# Patient Record
Sex: Male | Born: 1955 | Race: White | Hispanic: No | Marital: Married | Smoking: Light tobacco smoker
Health system: Southern US, Community
[De-identification: ages and names within clinical notes are randomized; demographics above are authoritative.]

## PROBLEM LIST (undated history)

## (undated) DIAGNOSIS — E785 Hyperlipidemia, unspecified: Secondary | ICD-10-CM

## (undated) DIAGNOSIS — R200 Anesthesia of skin: Secondary | ICD-10-CM

## (undated) DIAGNOSIS — K603 Anal fistula, unspecified: Secondary | ICD-10-CM

## (undated) DIAGNOSIS — I341 Nonrheumatic mitral (valve) prolapse: Secondary | ICD-10-CM

## (undated) DIAGNOSIS — M722 Plantar fascial fibromatosis: Secondary | ICD-10-CM

## (undated) HISTORY — DX: Anesthesia of skin: R20.0

## (undated) HISTORY — DX: Anal fistula, unspecified: K60.30

## (undated) HISTORY — DX: Hyperlipidemia, unspecified: E78.5

## (undated) HISTORY — PX: TONSILLECTOMY: SUR1361

## (undated) HISTORY — DX: Anal fistula: K60.3

## (undated) HISTORY — DX: Plantar fascial fibromatosis: M72.2

## (undated) HISTORY — PX: OTHER SURGICAL HISTORY: SHX169

## (undated) HISTORY — PX: WISDOM TOOTH EXTRACTION: SHX21

## (undated) HISTORY — PX: COLONOSCOPY: SHX174

## (undated) HISTORY — DX: Nonrheumatic mitral (valve) prolapse: I34.1

---

## 1999-02-17 ENCOUNTER — Ambulatory Visit (HOSPITAL_COMMUNITY): Admission: RE | Admit: 1999-02-17 | Discharge: 1999-02-17 | Payer: Self-pay | Admitting: Gastroenterology

## 2004-09-21 ENCOUNTER — Encounter: Admission: RE | Admit: 2004-09-21 | Discharge: 2004-09-21 | Payer: Self-pay | Admitting: Internal Medicine

## 2006-01-08 ENCOUNTER — Encounter: Admission: RE | Admit: 2006-01-08 | Discharge: 2006-01-08 | Payer: Self-pay | Admitting: Internal Medicine

## 2010-03-08 ENCOUNTER — Ambulatory Visit: Payer: Self-pay | Admitting: Cardiovascular Disease

## 2010-07-30 ENCOUNTER — Encounter (HOSPITAL_BASED_OUTPATIENT_CLINIC_OR_DEPARTMENT_OTHER): Payer: Self-pay | Admitting: Internal Medicine

## 2010-08-21 ENCOUNTER — Other Ambulatory Visit (INDEPENDENT_AMBULATORY_CARE_PROVIDER_SITE_OTHER): Payer: 59

## 2010-08-21 DIAGNOSIS — I059 Rheumatic mitral valve disease, unspecified: Secondary | ICD-10-CM

## 2010-08-21 DIAGNOSIS — E7800 Pure hypercholesterolemia, unspecified: Secondary | ICD-10-CM

## 2010-09-13 ENCOUNTER — Other Ambulatory Visit: Payer: Self-pay | Admitting: Cardiovascular Disease

## 2010-09-13 ENCOUNTER — Other Ambulatory Visit (INDEPENDENT_AMBULATORY_CARE_PROVIDER_SITE_OTHER): Payer: 59

## 2010-09-13 DIAGNOSIS — E7800 Pure hypercholesterolemia, unspecified: Secondary | ICD-10-CM

## 2010-09-16 LAB — NMR LIPOPROFILE WITHOUT LIPIDS: LDL Particle Number: 2159 nmol/L — ABNORMAL HIGH (ref <1000)

## 2010-11-07 ENCOUNTER — Telehealth: Payer: Self-pay | Admitting: Cardiovascular Disease

## 2010-11-07 NOTE — Telephone Encounter (Signed)
RETURNING DR. Harvie Bridge CALL

## 2010-11-13 ENCOUNTER — Telehealth: Payer: Self-pay | Admitting: Cardiovascular Disease

## 2010-11-13 ENCOUNTER — Other Ambulatory Visit: Payer: Self-pay | Admitting: Cardiovascular Disease

## 2010-11-13 DIAGNOSIS — E785 Hyperlipidemia, unspecified: Secondary | ICD-10-CM

## 2010-11-13 NOTE — Telephone Encounter (Signed)
Call to Christopher Tanner to obtain pharmacy info to call in new script.

## 2010-11-13 NOTE — Telephone Encounter (Signed)
You wanted patient to try Niaspan 500mg . Pt report taking it and becoming flush. Pt is currently taking Zetia 10mg  po daily as prescribed 09/15/10.  Chart back on desk please advise of any changes. Thanks Chandelle Harkey Opal Sidles

## 2010-11-28 ENCOUNTER — Telehealth: Payer: Self-pay | Admitting: Cardiovascular Disease

## 2010-11-28 NOTE — Telephone Encounter (Signed)
RETURNING CALL REGARDING LAB REPORT ZETIA 10MG  SOMETIME IN April. WHEN HE CAME IN WITH HIS MOM. COMING IN June FOR LABS. DR, Elease Hashimoto TOLD HIM AND WROTE SCRIPT WHEN HE WAS HERE WITH MOM

## 2010-11-29 ENCOUNTER — Other Ambulatory Visit: Payer: Self-pay | Admitting: *Deleted

## 2010-11-29 ENCOUNTER — Telehealth: Payer: Self-pay | Admitting: *Deleted

## 2010-11-29 DIAGNOSIS — E785 Hyperlipidemia, unspecified: Secondary | ICD-10-CM

## 2010-11-29 NOTE — Telephone Encounter (Signed)
Error

## 2011-01-23 ENCOUNTER — Other Ambulatory Visit (INDEPENDENT_AMBULATORY_CARE_PROVIDER_SITE_OTHER): Payer: 59 | Admitting: *Deleted

## 2011-01-23 ENCOUNTER — Other Ambulatory Visit: Payer: Self-pay | Admitting: *Deleted

## 2011-01-23 DIAGNOSIS — E785 Hyperlipidemia, unspecified: Secondary | ICD-10-CM

## 2011-01-23 LAB — BASIC METABOLIC PANEL
GFR: 73.9 mL/min (ref >60.00)
Glucose, Bld: 94 mg/dL (ref 70–99)
Potassium: 4.7 mEq/L (ref 3.5–5.1)
Sodium: 139 mEq/L (ref 135–145)

## 2011-01-23 LAB — LIPID PANEL
Cholesterol: 159 mg/dL (ref 0–200)
HDL: 65.3 mg/dL (ref >39.00)
Total CHOL/HDL Ratio: 2
Triglycerides: 72 mg/dL (ref 0.0–149.0)
VLDL: 14.4 mg/dL (ref 0.0–40.0)

## 2011-01-23 LAB — HEPATIC FUNCTION PANEL
ALT: 30 U/L (ref 0–53)
Albumin: 4.5 g/dL (ref 3.5–5.2)
Total Protein: 7.3 g/dL (ref 6.0–8.3)

## 2011-01-24 ENCOUNTER — Telehealth: Payer: Self-pay | Admitting: *Deleted

## 2011-01-24 NOTE — Telephone Encounter (Signed)
Message copied by Antony Odea on Wed Jan 24, 2011  2:01 PM ------      Message from: [name]      Created: Wed Jan 24, 2011  6:00 AM       Lipids look good.  The lipomed profile is still pending

## 2011-01-24 NOTE — Telephone Encounter (Signed)
Patient called with lab results. To call back with questions Alfonso Ramus RN

## 2011-01-30 ENCOUNTER — Other Ambulatory Visit: Payer: 59 | Admitting: *Deleted

## 2011-02-05 ENCOUNTER — Ambulatory Visit (AMBULATORY_SURGERY_CENTER): Payer: 59 | Admitting: *Deleted

## 2011-02-05 VITALS — Ht 75.0 in | Wt 183.3 lb

## 2011-02-05 DIAGNOSIS — Z1211 Encounter for screening for malignant neoplasm of colon: Secondary | ICD-10-CM

## 2011-02-05 MED ORDER — PEG-KCL-NACL-NASULF-NA ASC-C 100 G PO SOLR: ORAL | Status: DC

## 2011-02-06 ENCOUNTER — Encounter: Payer: Self-pay | Admitting: Gastroenterology

## 2011-02-07 ENCOUNTER — Encounter: Payer: Self-pay | Admitting: Cardiovascular Disease

## 2011-02-08 ENCOUNTER — Ambulatory Visit (INDEPENDENT_AMBULATORY_CARE_PROVIDER_SITE_OTHER): Payer: 59 | Admitting: Cardiovascular Disease

## 2011-02-08 ENCOUNTER — Encounter: Payer: Self-pay | Admitting: Cardiovascular Disease

## 2011-02-08 ENCOUNTER — Other Ambulatory Visit: Payer: Self-pay | Admitting: *Deleted

## 2011-02-08 ENCOUNTER — Telehealth: Payer: Self-pay | Admitting: *Deleted

## 2011-02-08 VITALS — BP 118/80 | HR 80 | Ht 75.0 in | Wt 185.2 lb

## 2011-02-08 DIAGNOSIS — E785 Hyperlipidemia, unspecified: Secondary | ICD-10-CM

## 2011-02-08 NOTE — Assessment & Plan Note (Signed)
Christopher Tanner is doing very well from a cardiac standpoint. He has tolerated the study without any difficulty. He is basic lipid profile looks good. The lateral med profile was not ordered correctly and therefore was not performed. We will have him come back to the office in several days repeat that level.  We've discussed the possibility that he may have to try Niaspan again. He had severe flushing with that. We'll try to encourage him to take small doses if his LDL particle number still very high. I'll plan on seeing him again in one year.

## 2011-02-08 NOTE — Progress Notes (Signed)
Christopher Tanner Date of Birth  1956/03/21 [facility] / [facility] [address]     [address] [place], [place]  [address] [phone]  Fax  [phone]  History of Present Illness:  Christopher Tanner is a 55 year old gentleman with a history of hyperlipidemia much by prolapse. He's done very well from a cardiac standpoint.   He has not had any episodes of chest pain or shortness of breath. He is exercising on a regular basis.   Current Outpatient Prescriptions on File Prior to Visit  Medication Sig Dispense Refill  . aspirin 81 MG tablet Take 81 mg by mouth daily.        . Multiple Vitamin (MULTIVITAMIN) tablet Take 1 tablet by mouth daily.        . Omega-3 Fatty Acids (FISH OIL PO) Take by mouth daily.        . traZODone (DESYREL) 100 MG tablet Take 100 mg by mouth as needed.       Marland Kitchen ZETIA 10 MG tablet Take 10 mg by mouth daily.         Allergies  Allergen Reactions  . Crestor (Rosuvastatin Calcium)     Leg aches  . Simvastatin     Memory Loss  . Vytorin     Stopped due to publicity about zetia  . Niaspan (Niacin) Other (See Comments)    flushing   Past Medical History  Diagnosis Date  . Hyperlipidemia   . Mitral valve prolapse     Last echocardiogram July, 2008. Moderate MVP of the posterior leaflet. Moderate mitral regurgitation. Normal left ventricular systolic function.  [name] fistula     Past Surgical History  Procedure Date  . Hand broken     Left    History  Smoking status  . Current Everyday Smoker  . Types: Cigars  Smokeless tobacco  . Not on file    History  Alcohol Use  . 0.0 oz/week    No family history on file.  Reviw of Systems:  Reviewed in the HPI.  All other systems are negative.  Physical Exam: BP 118/80  Pulse 80  Ht 6\' 3"  (1.905 m)  Wt 185 lb 3.2 oz (84.006 kg)  BMI 23.15 kg/m2 The patient is alert and oriented x 3.  The mood and affect are normal.   Skin: warm and dry.  Color is normal.    HEENT:   the  sclera are nonicteric.  The mucous membranes are moist.  The carotids are 2+ without bruits.  There is no thyromegaly.  There is no JVD.    Lungs: clear.  The chest wall is non tender.    Heart: regular rate with a normal S1 and S2.  There is a soft midsystolic click. He is a 2/6 systolic murmur radiating to the axilla. The PMI is not displaced.     Abdomen: good bowel sounds.  There is no guarding or rebound.  There is no hepatosplenomegaly or tenderness.  There are no masses.   Extremities:  no clubbing, cyanosis, or edema.  The legs are without rashes.  The distal pulses are intact.   Neuro:  Cranial nerves II - XII are intact.  Motor and sensory functions are intact.    The gait is normal.  Assessment / Plan:

## 2011-02-08 NOTE — Telephone Encounter (Signed)
Set up fasting lab for lipomed

## 2011-02-19 ENCOUNTER — Other Ambulatory Visit: Payer: 59 | Admitting: Gastroenterology

## 2011-02-23 ENCOUNTER — Ambulatory Visit (INDEPENDENT_AMBULATORY_CARE_PROVIDER_SITE_OTHER): Payer: 59 | Admitting: *Deleted

## 2011-02-23 DIAGNOSIS — E785 Hyperlipidemia, unspecified: Secondary | ICD-10-CM

## 2011-02-25 LAB — NMR LIPOPROFILE WITH LIPIDS
HDL Particle Number: 37.6 umol/L (ref >=30.5)
HDL-C: 65 mg/dL (ref >=40)
LDL Particle Number: 1257 nmol/L — ABNORMAL HIGH (ref <1000)
LDL Size: 21 nm (ref >20.5)
Triglycerides: 76 mg/dL (ref <150)

## 2011-02-26 ENCOUNTER — Ambulatory Visit (AMBULATORY_SURGERY_CENTER): Payer: 59 | Admitting: Gastroenterology

## 2011-02-26 ENCOUNTER — Telehealth: Payer: Self-pay | Admitting: *Deleted

## 2011-02-26 ENCOUNTER — Encounter: Payer: Self-pay | Admitting: Gastroenterology

## 2011-02-26 VITALS — BP 107/78 | HR 64 | Temp 97.7°F | Resp 18 | Ht 75.0 in | Wt 178.0 lb

## 2011-02-26 DIAGNOSIS — Z1211 Encounter for screening for malignant neoplasm of colon: Secondary | ICD-10-CM

## 2011-02-26 MED ORDER — SODIUM CHLORIDE 0.9 % IV SOLN: 500.0000 mL | INTRAVENOUS | Status: DC

## 2011-02-26 NOTE — Patient Instructions (Signed)
Follow discharge instructions.  Continue your medications.  Start a high fiber diet with liberal fluid intake.  Try Benefiber or Metamucil daily, with liberal fluid intake.

## 2011-02-26 NOTE — Telephone Encounter (Signed)
msg left with results and dr note, to call back with any questions and office number given

## 2011-02-26 NOTE — Telephone Encounter (Signed)
Message copied by Antony Odea on Mon Feb 26, 2011 11:23 AM ------      Message from: [name]      Created: Mon Feb 26, 2011  5:41 AM       These are significantly better than previous readings. Continue current meds.

## 2011-02-27 ENCOUNTER — Telehealth: Payer: Self-pay | Admitting: Cardiovascular Disease

## 2011-02-27 ENCOUNTER — Telehealth: Payer: Self-pay | Admitting: *Deleted

## 2011-02-27 NOTE — Telephone Encounter (Signed)
Labs faxed,Jodette Laurelton RN

## 2011-02-27 NOTE — Telephone Encounter (Signed)
Message left

## 2011-02-27 NOTE — Telephone Encounter (Signed)
FAX [phone] recent lab results. Patient said that you did not have to call him back.

## 2011-04-09 HISTORY — PX: TREATMENT FISTULA ANAL: SUR1390

## 2011-04-11 DIAGNOSIS — K603 Anal fistula: Secondary | ICD-10-CM | POA: Insufficient documentation

## 2011-10-03 ENCOUNTER — Other Ambulatory Visit: Payer: Self-pay | Admitting: *Deleted

## 2011-10-03 MED ORDER — EZETIMIBE 10 MG PO TABS: 10.0000 mg | ORAL_TABLET | Freq: Every day | ORAL | Status: DC

## 2011-11-26 ENCOUNTER — Other Ambulatory Visit: Payer: Self-pay | Admitting: Orthopedic Surgery

## 2011-11-26 DIAGNOSIS — M25512 Pain in left shoulder: Secondary | ICD-10-CM

## 2011-11-28 ENCOUNTER — Other Ambulatory Visit: Payer: 59

## 2011-12-02 ENCOUNTER — Ambulatory Visit
Admission: RE | Admit: 2011-12-02 | Discharge: 2011-12-02 | Disposition: A | Discharge status: Home / Self Care | Payer: 59 | Source: Ambulatory Visit | Attending: Orthopedic Surgery | Admitting: Orthopedic Surgery

## 2011-12-02 DIAGNOSIS — M25512 Pain in left shoulder: Secondary | ICD-10-CM

## 2012-05-09 ENCOUNTER — Other Ambulatory Visit: Payer: Self-pay | Admitting: *Deleted

## 2012-05-09 MED ORDER — EZETIMIBE 10 MG PO TABS: 10.0000 mg | ORAL_TABLET | Freq: Every day | ORAL | Status: DC

## 2012-05-09 NOTE — Telephone Encounter (Signed)
Pt needs appointment then refill can be made Fax Received. Refill Completed. Christopher Tanner (R.M.A)   

## 2012-06-30 ENCOUNTER — Telehealth: Payer: Self-pay | Admitting: Cardiovascular Disease

## 2012-06-30 MED ORDER — EZETIMIBE 10 MG PO TABS: 10.0000 mg | ORAL_TABLET | Freq: Every day | ORAL | Status: DC

## 2012-06-30 NOTE — Telephone Encounter (Signed)
Pt needs Zetia refill  Sent to preferred pharmacy [facility].

## 2012-07-09 HISTORY — PX: KNEE ARTHROSCOPY: SUR90

## 2012-07-30 ENCOUNTER — Encounter: Payer: Self-pay | Admitting: *Deleted

## 2012-07-30 ENCOUNTER — Other Ambulatory Visit (INDEPENDENT_AMBULATORY_CARE_PROVIDER_SITE_OTHER): Payer: 59

## 2012-07-30 ENCOUNTER — Other Ambulatory Visit: Payer: Self-pay | Admitting: *Deleted

## 2012-07-30 DIAGNOSIS — E785 Hyperlipidemia, unspecified: Secondary | ICD-10-CM

## 2012-07-30 LAB — HEPATIC FUNCTION PANEL
AST: 31 U/L (ref 0–37)
Albumin: 3.9 g/dL (ref 3.5–5.2)
Total Bilirubin: 0.6 mg/dL (ref 0.3–1.2)

## 2012-07-30 LAB — BASIC METABOLIC PANEL
BUN: 18 mg/dL (ref 6–23)
Calcium: 8.9 mg/dL (ref 8.4–10.5)
Creatinine, Ser: 1.1 mg/dL (ref 0.4–1.5)
GFR: 77.54 mL/min (ref >60.00)
Glucose, Bld: 93 mg/dL (ref 70–99)

## 2012-07-30 MED ORDER — EZETIMIBE 10 MG PO TABS: 10.0000 mg | ORAL_TABLET | Freq: Every day | ORAL | Status: DC

## 2012-07-30 NOTE — Progress Notes (Signed)
Pt walked in asking for fasting labs, ordered nmr with lipids/ bmet/ liver panel

## 2012-07-30 NOTE — Telephone Encounter (Signed)
Fax Received. Refill Completed. Christopher Tanner (R.M.A)  NO REFILLS UNTIL APPOINTMENT  

## 2012-08-01 LAB — NMR LIPOPROFILE WITH LIPIDS
HDL Size: 8.9 nm — ABNORMAL LOW (ref >=9.2)
LDL Particle Number: 1031 nmol/L — ABNORMAL HIGH (ref <1000)
LDL Size: 20.8 nm (ref >20.5)
Large HDL-P: 5.7 umol/L (ref >=4.8)
Large VLDL-P: 2.8 nmol/L — ABNORMAL HIGH (ref <=2.7)
Small LDL Particle Number: 441 nmol/L (ref <=527)
VLDL Size: 48.4 nm — ABNORMAL HIGH (ref >=46.6)

## 2012-08-11 ENCOUNTER — Encounter: Payer: Self-pay | Admitting: Cardiovascular Disease

## 2012-08-12 ENCOUNTER — Encounter: Payer: Self-pay | Admitting: *Deleted

## 2012-08-13 ENCOUNTER — Encounter: Payer: Self-pay | Admitting: Cardiovascular Disease

## 2012-08-13 ENCOUNTER — Ambulatory Visit (INDEPENDENT_AMBULATORY_CARE_PROVIDER_SITE_OTHER): Payer: 59 | Admitting: Cardiovascular Disease

## 2012-08-13 VITALS — BP 108/80 | HR 75 | Ht 75.0 in | Wt 190.8 lb

## 2012-08-13 DIAGNOSIS — I341 Nonrheumatic mitral (valve) prolapse: Secondary | ICD-10-CM | POA: Insufficient documentation

## 2012-08-13 DIAGNOSIS — I059 Rheumatic mitral valve disease, unspecified: Secondary | ICD-10-CM

## 2012-08-13 DIAGNOSIS — E785 Hyperlipidemia, unspecified: Secondary | ICD-10-CM

## 2012-08-13 NOTE — Patient Instructions (Addendum)
Your physician wants you to follow-up in: 1 year You will receive a reminder letter in the mail two months in advance. If you don't receive a letter, please call our office to schedule the follow-up appointment.  Your physician recommends that you continue on your current medications as directed. Please refer to the Current Medication list given to you today.  Your physician has requested that you have an echocardiogram. Echocardiography is a painless test that uses sound waves to create images of your heart. It provides your doctor with information about the size and shape of your heart and how well your heart's chambers and valves are working. This procedure takes approximately one hour. There are no restrictions for this procedure.    

## 2012-08-13 NOTE — Progress Notes (Signed)
Christopher Tanner Date of Birth  1955/08/14 [facility] / [facility] [address]     [address] [place], [place]  [address] [phone]  Fax  [phone]  Problem List 1. Hyperlipidemia 2. Mitral valve prolapse  History of Present Illness:  Christopher Tanner is a 57 year old gentleman with a history of hyperlipidemia and mitral valve prolapse. He's done very well from a cardiac standpoint.   He has not had any episodes of chest pain or shortness of breath. He is exercising on a regular basis.  Feb. 5, 2014: He is doing well.  He has had some shoulder issues and is having to scale back his exercise.    Current Outpatient Prescriptions on File Prior to Visit  Medication Sig Dispense Refill  . aspirin 81 MG tablet Take 81 mg by mouth daily.        Marland Kitchen ezetimibe (ZETIA) 10 MG tablet Take 1 tablet (10 mg total) by mouth daily.  30 tablet  1  . Multiple Vitamin (MULTIVITAMIN) tablet Take 1 tablet by mouth daily.        . Omega-3 Fatty Acids (FISH OIL PO) Take by mouth daily.          Allergies  Allergen Reactions  . Crestor (Rosuvastatin Calcium)     Leg aches  . Ezetimibe-Simvastatin     Stopped due to publicity about zetia  . Simvastatin     Memory Loss  . Niaspan (Niacin) Other (See Comments)    flushing   Past Medical History  Diagnosis Date  . Hyperlipidemia   . Mitral valve prolapse     Last echocardiogram July, 2008. Moderate MVP of the posterior leaflet. Moderate mitral regurgitation. Normal left ventricular systolic function.  [name] fistula     Past Surgical History  Procedure Date  . Hand broken     Left  . Colonoscopy     History  Smoking status  . Light Tobacco Smoker  . Types: Cigars  Smokeless tobacco  . Not on file    History  Alcohol Use  . 0.0 oz/week    Family History  Problem Relation Age of Onset  . Heart disease Father     Reviw of Systems:  Reviewed in the HPI.  All other systems are negative.  Physical Exam: BP  108/80  Pulse 75  Ht 6\' 3"  (1.905 m)  Wt 190 lb 12.8 oz (86.546 kg)  BMI 23.85 kg/m2 The patient is alert and oriented x 3.  The mood and affect are normal.   Skin: warm and dry.  Color is normal.    HEENT:   the sclera are nonicteric.  The mucous membranes are moist.  The carotids are 2+ without bruits.  There is no thyromegaly.  There is no JVD.    Lungs: clear.  The chest wall is non tender.    Heart: regular rate with a normal S1 and S2.  There is a soft midsystolic click. He is a 2/6 systolic murmur radiating to the axilla. The PMI is not displaced.     Abdomen: good bowel sounds.  There is no guarding or rebound.  There is no hepatosplenomegaly or tenderness.  There are no masses.   Extremities:  no clubbing, cyanosis, or edema.  The legs are without rashes.  The distal pulses are intact.   Neuro:  Cranial nerves II - XII are intact.  Motor and sensory functions are intact.    The gait is normal.  ECG:  Feb. 5, 2014: NSR at 75, early repol.  Assessment / Plan:

## 2012-08-13 NOTE — Assessment & Plan Note (Signed)
Christopher Tanner seems to be doing well. His last LDL particle number was 1031 which is quite acceptable. He has done well on the current dose of Zetia 10 mg a day.  We'll continue his current medications.

## 2012-08-13 NOTE — Assessment & Plan Note (Signed)
He has a history of mitral regurgitation. It has been a Number of years since he had an echocardiogram. We will repeat his echocardiogram for further evaluation.  We had considerable discussion regarding mitral valve repair.

## 2012-08-20 ENCOUNTER — Ambulatory Visit (HOSPITAL_COMMUNITY): Payer: 59 | Attending: Cardiovascular Disease | Admitting: Radiology

## 2012-08-20 DIAGNOSIS — I341 Nonrheumatic mitral (valve) prolapse: Secondary | ICD-10-CM

## 2012-08-20 DIAGNOSIS — R011 Cardiac murmur, unspecified: Secondary | ICD-10-CM

## 2012-08-20 DIAGNOSIS — I079 Rheumatic tricuspid valve disease, unspecified: Secondary | ICD-10-CM | POA: Insufficient documentation

## 2012-08-20 DIAGNOSIS — I059 Rheumatic mitral valve disease, unspecified: Secondary | ICD-10-CM | POA: Insufficient documentation

## 2012-08-20 DIAGNOSIS — E785 Hyperlipidemia, unspecified: Secondary | ICD-10-CM | POA: Insufficient documentation

## 2012-08-20 NOTE — Progress Notes (Signed)
Echocardiogram performed.  

## 2012-08-23 ENCOUNTER — Other Ambulatory Visit: Payer: Self-pay

## 2012-08-27 ENCOUNTER — Telehealth: Payer: Self-pay | Admitting: Cardiovascular Disease

## 2012-08-27 NOTE — Telephone Encounter (Signed)
Pt requested call after 2p, rtn call re echo results

## 2012-08-27 NOTE — Telephone Encounter (Signed)
Left pt a message to call back. 

## 2012-11-11 ENCOUNTER — Ambulatory Visit
Admission: RE | Admit: 2012-11-11 | Discharge: 2012-11-11 | Disposition: A | Discharge status: Home / Self Care | Payer: 59 | Source: Ambulatory Visit | Attending: Orthopedic Surgery | Admitting: Orthopedic Surgery

## 2012-11-11 ENCOUNTER — Other Ambulatory Visit: Payer: Self-pay | Admitting: Orthopedic Surgery

## 2012-11-11 ENCOUNTER — Telehealth: Payer: Self-pay | Admitting: *Deleted

## 2012-11-11 DIAGNOSIS — R52 Pain, unspecified: Secondary | ICD-10-CM

## 2012-11-11 NOTE — Telephone Encounter (Signed)
He may hold his ASA for 7 days.  He is at low risk for surgery.

## 2012-11-11 NOTE — Telephone Encounter (Signed)
Received letter today for cardiac clearance from [facility] ortho. Needs clearance for left knee scoping and to hold asa 81 mg and how many days, please advise.

## 2012-11-11 NOTE — Telephone Encounter (Signed)
Their form was signed and faxed back by MR,

## 2012-11-12 ENCOUNTER — Other Ambulatory Visit: Payer: 59

## 2012-11-16 ENCOUNTER — Encounter: Payer: Self-pay | Admitting: Cardiovascular Disease

## 2012-11-17 ENCOUNTER — Other Ambulatory Visit: Payer: Self-pay | Admitting: *Deleted

## 2012-11-17 MED ORDER — GLUCOSAMINE CHONDR 1500 COMPLX PO CAPS: 1500.0000 | ORAL_CAPSULE | Freq: Every day | ORAL | Status: DC

## 2012-11-17 MED ORDER — MULTIVITAL PERFORMANCE PO TABS: 1.0000 | ORAL_TABLET | Freq: Every day | ORAL | Status: AC

## 2012-11-17 MED ORDER — CHOLECALCIFEROL 125 MCG (5000 UT) PO CAPS: 10000.0000 [IU] | ORAL_CAPSULE | Freq: Every day | ORAL | Status: DC

## 2012-11-17 MED ORDER — CO-ENZYME Q-10 10 MG PO CAPS: 1.0000 | ORAL_CAPSULE | Freq: Every day | ORAL | Status: DC

## 2012-11-17 NOTE — Progress Notes (Signed)
PER MYCHART REQUEST MED LIST UPDATED, SEVERAL OF THE VITAMINS AND SUCH WERE NOT ABLE TO BE MATCHED EXACTLY TO THE PTS  OTC SUPPLIMENTS, PT INFORMED.

## 2012-12-12 ENCOUNTER — Other Ambulatory Visit: Payer: Self-pay | Admitting: *Deleted

## 2012-12-12 MED ORDER — EZETIMIBE 10 MG PO TABS: 10.0000 mg | ORAL_TABLET | Freq: Every day | ORAL | Status: DC

## 2012-12-12 NOTE — Telephone Encounter (Signed)
Fax Received. Refill Completed. Christopher Tanner (R.M.A)   

## 2013-02-16 ENCOUNTER — Other Ambulatory Visit (HOSPITAL_COMMUNITY): Payer: Self-pay | Admitting: Cardiovascular Disease

## 2013-02-16 ENCOUNTER — Ambulatory Visit (HOSPITAL_COMMUNITY): Payer: 59 | Attending: Internal Medicine

## 2013-02-16 DIAGNOSIS — I341 Nonrheumatic mitral (valve) prolapse: Secondary | ICD-10-CM

## 2013-02-16 DIAGNOSIS — F17200 Nicotine dependence, unspecified, uncomplicated: Secondary | ICD-10-CM | POA: Insufficient documentation

## 2013-02-16 DIAGNOSIS — E785 Hyperlipidemia, unspecified: Secondary | ICD-10-CM | POA: Insufficient documentation

## 2013-02-16 DIAGNOSIS — I059 Rheumatic mitral valve disease, unspecified: Secondary | ICD-10-CM

## 2013-02-16 NOTE — Progress Notes (Signed)
Echocardiogram performed.  

## 2013-02-19 ENCOUNTER — Telehealth: Payer: Self-pay | Admitting: *Deleted

## 2013-02-19 NOTE — Telephone Encounter (Signed)
Pt called with results. He wants to know if he needs to keep 03/02/13 OV app?  Pt feeling well. Will a 1 year be ok? Please advise.

## 2013-02-19 NOTE — Telephone Encounter (Signed)
Message copied by Antony Odea on Thu Feb 19, 2013  4:53 PM ------      Message from: [name]      Created: Tue Feb 17, 2013  5:19 PM       Mild mitral valve prolapse with mild - mod MR. ------

## 2013-02-23 ENCOUNTER — Encounter: Payer: Self-pay | Admitting: Cardiovascular Disease

## 2013-02-23 NOTE — Telephone Encounter (Signed)
App cancelled/ wife was informed.

## 2013-02-23 NOTE — Telephone Encounter (Signed)
Ov 1 year from his last visit will be fine.  MVP is stable

## 2013-03-02 ENCOUNTER — Ambulatory Visit: Payer: 59 | Admitting: Cardiovascular Disease

## 2013-03-25 ENCOUNTER — Encounter: Payer: Self-pay | Admitting: Cardiovascular Disease

## 2013-05-14 ENCOUNTER — Other Ambulatory Visit: Payer: Self-pay

## 2013-07-07 ENCOUNTER — Other Ambulatory Visit: Payer: Self-pay | Admitting: Cardiovascular Disease

## 2013-07-28 ENCOUNTER — Other Ambulatory Visit: Payer: Self-pay | Admitting: *Deleted

## 2013-07-28 ENCOUNTER — Encounter: Payer: Self-pay | Admitting: Cardiovascular Disease

## 2013-07-28 DIAGNOSIS — E785 Hyperlipidemia, unspecified: Secondary | ICD-10-CM

## 2013-07-28 NOTE — Progress Notes (Signed)
Lab orders placed for an upcoming app.

## 2013-08-20 ENCOUNTER — Other Ambulatory Visit (INDEPENDENT_AMBULATORY_CARE_PROVIDER_SITE_OTHER): Payer: 59

## 2013-08-20 DIAGNOSIS — E785 Hyperlipidemia, unspecified: Secondary | ICD-10-CM

## 2013-08-20 LAB — HEPATIC FUNCTION PANEL
ALBUMIN: 3.9 g/dL (ref 3.5–5.2)
ALK PHOS: 45 U/L (ref 39–117)
ALT: 38 U/L (ref 0–53)
AST: 33 U/L (ref 0–37)
Bilirubin, Direct: 0 mg/dL (ref 0.0–0.3)
TOTAL PROTEIN: 6.9 g/dL (ref 6.0–8.3)
Total Bilirubin: 0.6 mg/dL (ref 0.3–1.2)

## 2013-08-20 LAB — BASIC METABOLIC PANEL
BUN: 19 mg/dL (ref 6–23)
CHLORIDE: 105 meq/L (ref 96–112)
CO2: 27 meq/L (ref 19–32)
CREATININE: 1 mg/dL (ref 0.4–1.5)
Calcium: 8.6 mg/dL (ref 8.4–10.5)
GFR: 81.73 mL/min (ref >60.00)
Glucose, Bld: 86 mg/dL (ref 70–99)
POTASSIUM: 4 meq/L (ref 3.5–5.1)
SODIUM: 141 meq/L (ref 135–145)

## 2013-08-20 LAB — LIPID PANEL
CHOL/HDL RATIO: 3
CHOLESTEROL: 172 mg/dL (ref 0–200)
HDL: 57.1 mg/dL (ref >39.00)
LDL CALC: 97 mg/dL (ref 0–99)
TRIGLYCERIDES: 89 mg/dL (ref 0.0–149.0)
VLDL: 17.8 mg/dL (ref 0.0–40.0)

## 2013-08-25 ENCOUNTER — Ambulatory Visit: Payer: 59 | Admitting: Cardiovascular Disease

## 2013-09-20 ENCOUNTER — Other Ambulatory Visit: Payer: Self-pay | Admitting: Cardiovascular Disease

## 2013-09-24 ENCOUNTER — Ambulatory Visit: Payer: 59 | Admitting: Cardiovascular Disease

## 2013-09-29 ENCOUNTER — Encounter: Payer: Self-pay | Admitting: Cardiovascular Disease

## 2013-09-29 ENCOUNTER — Ambulatory Visit (INDEPENDENT_AMBULATORY_CARE_PROVIDER_SITE_OTHER): Payer: 59 | Admitting: Cardiovascular Disease

## 2013-09-29 VITALS — BP 110/80 | HR 69 | Ht 75.0 in | Wt 190.1 lb

## 2013-09-29 DIAGNOSIS — E785 Hyperlipidemia, unspecified: Secondary | ICD-10-CM

## 2013-09-29 DIAGNOSIS — I059 Rheumatic mitral valve disease, unspecified: Secondary | ICD-10-CM

## 2013-09-29 DIAGNOSIS — I341 Nonrheumatic mitral (valve) prolapse: Secondary | ICD-10-CM

## 2013-09-29 NOTE — Assessment & Plan Note (Signed)
We had long discussion about lipids and will go particle numbers. His LDL particle number has decreased significantly from 2012 (   2159, 1257, 1031)  He is tolerating his Zetia quite well.  Will continue his current medications. In one year we will get a repeat Lipomed profile, lipids, liver enz. BMP.  I will see him 2- 3 weeks after that for followup office visit.

## 2013-09-29 NOTE — Patient Instructions (Signed)
Your physician recommends that you return for a FASTING lipid profile: 1 YEAR    Your physician wants you to follow-up in: 1 YEAR// 1 WEEK AFTER LAB DATE You will receive a reminder letter in the mail two months in advance. If you don't receive a letter, please call our office to schedule the follow-up appointment.  Your physician recommends that you continue on your current medications as directed. Please refer to the Current Medication list given to you today.

## 2013-09-29 NOTE — Progress Notes (Signed)
Christopher Tanner Date of Birth  1956/05/05 [facility] [address]     [place], [place]  [address] [phone]  Fax  [phone]  Problem List 1. Hyperlipidemia 2. Mitral valve prolapse  History of Present Illness:  Christopher Tanner is a 58 year old gentleman with a history of hyperlipidemia and mitral valve prolapse. He's done very well from a cardiac standpoint.   He has not had any episodes of chest pain or shortness of breath. He is exercising on a regular basis.  Feb. 5, 2014: He is doing well.  He has had some shoulder issues and is having to scale back his exercise.    September 29, 2013:  Christopher Tanner is doing well.  He had left knee surgery Percell Miller)  last year that has done well.  No dyspnea.  Running regularly without problems.    Current Outpatient Prescriptions on File Prior to Visit  Medication Sig Dispense Refill  . aspirin 81 MG tablet Take 81 mg by mouth daily.        . Cholecalciferol ([facility] VIT D 5000 HIGH-POTENCY) 5000 UNITS capsule Take 2 capsules (10,000 Units total) by mouth daily.      Marland Kitchen Co-Enzyme Q-10 10 MG CAPS Take 1 capsule (10 mg total) by mouth daily.    0  . Glucosamine-Chondroit-Vit C-Mn (GLUCOSAMINE CHONDR 1500 COMPLX) CAPS Take 1,500 capsules by mouth daily.      . Multiple Vitamin (MULTIVITAMIN) tablet Take 1 tablet by mouth daily.        . Multiple Vitamins-Minerals (MULTIVITAL PERFORMANCE) TABS Take 1 tablet by mouth daily.      . Omega-3 Fatty Acids (FISH OIL PO) Take by mouth daily.        Marland Kitchen ZETIA 10 MG tablet TAKE 1 TABLET (10 MG TOTAL) BY MOUTH DAILY.  30 tablet  0   No current facility-administered medications on file prior to visit.    Allergies  Allergen Reactions  . Crestor [Rosuvastatin Calcium]     Leg aches  . Ezetimibe-Simvastatin     Stopped due to publicity about zetia  . Simvastatin     Memory Loss  . Niaspan [Niacin] Other (See Comments)    flushing   Past Medical History  Diagnosis Date  .  Hyperlipidemia   . Mitral valve prolapse     Last echocardiogram July, 2008. Moderate MVP of the posterior leaflet. Moderate mitral regurgitation. Normal left ventricular systolic function.  Orpah Cobb fistula     Past Surgical History  Procedure Laterality Date  . Hand broken      Left  . Colonoscopy      History  Smoking status  . Light Tobacco Smoker  . Types: Cigars  Smokeless tobacco  . Not on file    History  Alcohol Use  . 0.0 oz/week    Family History  Problem Relation Age of Onset  . Heart disease Father     Reviw of Systems:  Reviewed in the HPI.  All other systems are negative.  Physical Exam: BP 110/80  Pulse 69  Ht 6\' 3"  (1.905 m)  Wt 190 lb 1.9 oz (86.238 kg)  BMI 23.76 kg/m2 The patient is alert and oriented x 3.  The mood and affect are normal.   Skin: warm and dry.  Color is normal.    HEENT:   the sclera are nonicteric.  The mucous membranes are moist.  The carotids are 2+ without bruits.  There is no thyromegaly.  There is no JVD.    Lungs: clear.  The chest wall is non tender.    Heart: regular rate with a normal S1 and S2.  There is a soft, late midsystolic click. He is a 2/6 systolic murmur radiating to the axilla. The PMI is not displaced.     Abdomen: good bowel sounds.  There is no guarding or rebound.  There is no hepatosplenomegaly or tenderness.  There are no masses.   Extremities:  no clubbing, cyanosis, or edema.  The legs are without rashes.  The distal pulses are intact.   Neuro:  Cranial nerves II - XII are intact.  Motor and sensory functions are intact.    The gait is normal.  ECG:  September 29, 2013:  nsr at 6. Normal ecg.   Assessment / Plan:

## 2013-09-29 NOTE — Assessment & Plan Note (Signed)
Christopher Tanner is doing well. His mitral valve prolapse is stable. He is a soft systolic murmur. His echo last year showed no changes in his mitral valve prolapse or mitral regurgitation.

## 2013-10-24 ENCOUNTER — Other Ambulatory Visit: Payer: Self-pay | Admitting: Cardiovascular Disease

## 2014-04-23 ENCOUNTER — Other Ambulatory Visit: Payer: Self-pay

## 2014-04-26 ENCOUNTER — Other Ambulatory Visit: Payer: Self-pay | Admitting: Cardiovascular Disease

## 2014-09-20 ENCOUNTER — Other Ambulatory Visit (INDEPENDENT_AMBULATORY_CARE_PROVIDER_SITE_OTHER): Payer: 59 | Admitting: *Deleted

## 2014-09-20 DIAGNOSIS — I341 Nonrheumatic mitral (valve) prolapse: Secondary | ICD-10-CM

## 2014-09-20 DIAGNOSIS — E785 Hyperlipidemia, unspecified: Secondary | ICD-10-CM

## 2014-09-20 LAB — HEPATIC FUNCTION PANEL
ALBUMIN: 4 g/dL (ref 3.5–5.2)
ALT: 32 U/L (ref 0–53)
AST: 29 U/L (ref 0–37)
Alkaline Phosphatase: 45 U/L (ref 39–117)
Bilirubin, Direct: 0.1 mg/dL (ref 0.0–0.3)
TOTAL PROTEIN: 6.5 g/dL (ref 6.0–8.3)
Total Bilirubin: 0.5 mg/dL (ref 0.2–1.2)

## 2014-09-20 LAB — BASIC METABOLIC PANEL
BUN: 22 mg/dL (ref 6–23)
CALCIUM: 8.8 mg/dL (ref 8.4–10.5)
CHLORIDE: 106 meq/L (ref 96–112)
CO2: 29 mEq/L (ref 19–32)
Creatinine, Ser: 1.08 mg/dL (ref 0.40–1.50)
GFR: 74.5 mL/min (ref >60.00)
GLUCOSE: 93 mg/dL (ref 70–99)
Potassium: 4 mEq/L (ref 3.5–5.1)
Sodium: 138 mEq/L (ref 135–145)

## 2014-09-21 ENCOUNTER — Other Ambulatory Visit: Payer: Self-pay

## 2014-09-22 LAB — NMR LIPOPROFILE WITH LIPIDS
CHOLESTEROL, TOTAL: 172 mg/dL (ref 100–199)
HDL Particle Number: 38.1 umol/L (ref >=30.5)
HDL Size: 9 nm — ABNORMAL LOW (ref >=9.2)
HDL-C: 58 mg/dL (ref >39)
LARGE HDL: 6.9 umol/L (ref >=4.8)
LARGE VLDL-P: 2.7 nmol/L (ref <=2.7)
LDL (calc): 93 mg/dL (ref 0–99)
LDL Particle Number: 1096 nmol/L — ABNORMAL HIGH (ref <1000)
LDL Size: 21.1 nm (ref >=20.8)
LP-IR Score: 49 — ABNORMAL HIGH (ref <=45)
Small LDL Particle Number: 300 nmol/L (ref <=527)
TRIGLYCERIDES: 107 mg/dL (ref 0–149)
VLDL Size: 51.2 nm — ABNORMAL HIGH (ref <=46.6)

## 2014-10-05 ENCOUNTER — Ambulatory Visit (INDEPENDENT_AMBULATORY_CARE_PROVIDER_SITE_OTHER): Payer: 59 | Admitting: Cardiovascular Disease

## 2014-10-05 ENCOUNTER — Encounter: Payer: Self-pay | Admitting: Cardiovascular Disease

## 2014-10-05 VITALS — BP 100/74 | HR 64 | Ht 75.0 in | Wt 192.8 lb

## 2014-10-05 DIAGNOSIS — E785 Hyperlipidemia, unspecified: Secondary | ICD-10-CM | POA: Diagnosis not present

## 2014-10-05 DIAGNOSIS — I341 Nonrheumatic mitral (valve) prolapse: Secondary | ICD-10-CM

## 2014-10-05 NOTE — Progress Notes (Addendum)
Cardiology Office Note   Date:  10/05/2014   ID:  Christopher Tanner, DOB 04-Jan-1956, MRN [id number]  PCP:  Donnajean Lopes, MD  Cardiologist:   Thayer Headings, MD   Chief Complaint  Patient presents with  . Follow-up    mitral valve prolapse   1. Hyperlipidemia 2. Mitral valve prolapse  History of Present Illness:  Christopher Tanner is a 59 year old gentleman with a history of hyperlipidemia and mitral valve prolapse. He's done very well from a cardiac standpoint.  He has not had any episodes of chest pain or shortness of breath. He is exercising on a regular basis.  Feb. 5, 2014: He is doing well. He has had some shoulder issues and is having to scale back his exercise.     October 05, 2014:  Christopher Tanner is a 59 y.o. male who presents for his MVP. No CP , no dyspnea.  No symptoms related to MVP,      Past Medical History  Diagnosis Date  . Hyperlipidemia   . Mitral valve prolapse     Last echocardiogram July, 2008. Moderate MVP of the posterior leaflet. Moderate mitral regurgitation. Normal left ventricular systolic function.  Christopher Tanner fistula     Past Surgical History  Procedure Laterality Date  . Hand broken      Left  . Colonoscopy       Current Outpatient Prescriptions  Medication Sig Dispense Refill  . aspirin 81 MG tablet Take 81 mg by mouth daily.      . Cholecalciferol ([facility] VIT D 5000 HIGH-POTENCY) 5000 UNITS capsule Take 2 capsules (10,000 Units total) by mouth daily.    Marland Kitchen Co-Enzyme Q-10 10 MG CAPS Take 1 capsule (10 mg total) by mouth daily.  0  . Multiple Vitamin (MULTIVITAMIN) tablet Take 1 tablet by mouth daily.      . Multiple Vitamins-Minerals (MULTIVITAL PERFORMANCE) TABS Take 1 tablet by mouth daily.    . Omega-3 Fatty Acids (FISH OIL PO) Take by mouth daily.      Marland Kitchen ZETIA 10 MG tablet TAKE 1 TABLET (10 MG TOTAL) BY MOUTH DAILY. 30 tablet 11   No current facility-administered medications for this visit.    Allergies:   Crestor;  Ezetimibe-simvastatin; Simvastatin; and Niaspan    Social History:  The patient  reports that he has been smoking Cigars.  He does not have any smokeless tobacco history on file. He reports that he drinks alcohol. He reports that he does not use illicit drugs.   Family History:  The patient's family history includes Heart disease in his father.    ROS:  Please see the history of present illness.    Review of Systems: Constitutional:  denies fever, chills, diaphoresis, appetite change and fatigue.  HEENT: denies photophobia, eye pain, redness, hearing loss, ear pain, congestion, sore throat, rhinorrhea, sneezing, neck pain, neck stiffness and tinnitus.  Respiratory: denies SOB, DOE, cough, chest tightness, and wheezing.  Cardiovascular: denies chest pain, palpitations and leg swelling.  Gastrointestinal: denies nausea, vomiting, abdominal pain, diarrhea, constipation, blood in stool.  Genitourinary: denies dysuria, urgency, frequency, hematuria, flank pain and difficulty urinating.  Musculoskeletal: denies  myalgias, back pain, joint swelling, arthralgias and gait problem.   Skin: denies pallor, rash and wound.  Neurological: denies dizziness, seizures, syncope, weakness, light-headedness, numbness and headaches.   Hematological: denies adenopathy, easy bruising, personal or family bleeding history.  Psychiatric/ Behavioral: denies suicidal ideation, mood changes, confusion, nervousness, sleep disturbance and agitation.  All other systems are reviewed and negative.    PHYSICAL EXAM: VS:  BP 100/74 mmHg  Pulse 64  Ht 6\' 3"  (1.905 m)  Wt 192 lb 12.8 oz (87.454 kg)  BMI 24.10 kg/m2 , BMI Body mass index is 24.1 kg/(m^2). GEN: Well nourished, well developed, in no acute distress HEENT: normal Neck: no JVD, carotid bruits, or masses Cardiac: RR, 2/6  late systolic murmur  Respiratory:  clear to auscultation bilaterally, normal work of breathing GI: soft, nontender,  nondistended, + BS MS: no deformity or atrophy Skin: warm and dry, no rash Neuro:  Strength and sensation are intact Psych: normal   EKG:  EKG is ordered today. The ekg ordered today demonstrates NSR at 64. NSR with sinus arrhthymia.  Normal ecg    Recent Labs: 09/20/2014: ALT 32; BUN 22; Creatinine 1.08; Potassium 4.0; Sodium 138    Lipid Panel    Component Value Date/Time   CHOL 172 09/20/2014 0828   CHOL 172 08/20/2013 0746   TRIG 107 09/20/2014 0828   TRIG 89.0 08/20/2013 0746   HDL 58 09/20/2014 0828   HDL 57.10 08/20/2013 0746   CHOLHDL 3 08/20/2013 0746   VLDL 17.8 08/20/2013 0746   LDLCALC 93 09/20/2014 0828   LDLCALC 97 08/20/2013 0746      Wt Readings from Last 3 Encounters:  10/05/14 192 lb 12.8 oz (87.454 kg)  09/29/13 190 lb 1.9 oz (86.238 kg)  08/13/12 190 lb 12.8 oz (86.546 kg)      Other studies Reviewed: Additional studies/ records that were reviewed today include: . Review of the above records demonstrates:    ASSESSMENT AND PLAN:  1.  Mitral valve prolapse:  He is having some increasing shortness of breath at times. He's noticed that he is not able to run as far as he  used to. Because of these symptoms and because he had at least moderate mitral valve prolapse at his last echo, we'll repeat his echocardiogram next week or so. I'll plan on seeing him in one year for follow-up.  2. Hyperlipidemia:  Labs look great  Continue Zetia.recheck labs in 1 year    Current medicines are reviewed at length with the patient today.  The patient does not have concerns regarding medicines.  The following changes have been made:  no change  Labs/ tests ordered today include:  Orders Placed This Encounter  Procedures  . EKG 12-Lead  . 2D Echocardiogram with contrast     Disposition:   FU with me in 1 year     Signed, Cordia Miklos, [name], MD  10/05/2014 8:50 AM    [facility] [place], [place], [place]  [address] Phone:  [phone]; Fax: [phone]

## 2014-10-05 NOTE — Patient Instructions (Signed)
Your physician recommends that you continue on your current medications as directed. Please refer to the Current Medication list given to you today.  Your physician has requested that you have an echocardiogram. Echocardiography is a painless test that uses sound waves to create images of your heart. It provides your doctor with information about the size and shape of your heart and how well your heart's chambers and valves are working. This procedure takes approximately one hour. There are no restrictions for this procedure.  Your physician wants you to follow-up in: 1 year with Dr. Nahser.  You will receive a reminder letter in the mail two months in advance. If you don't receive a letter, please call our office to schedule the follow-up appointment.  

## 2014-10-07 ENCOUNTER — Ambulatory Visit (HOSPITAL_COMMUNITY): Payer: 59 | Attending: Cardiovascular Disease

## 2014-10-07 DIAGNOSIS — E785 Hyperlipidemia, unspecified: Secondary | ICD-10-CM | POA: Insufficient documentation

## 2014-10-07 DIAGNOSIS — I341 Nonrheumatic mitral (valve) prolapse: Secondary | ICD-10-CM

## 2014-10-07 DIAGNOSIS — Z720 Tobacco use: Secondary | ICD-10-CM | POA: Insufficient documentation

## 2014-10-07 NOTE — Progress Notes (Signed)
2D Echo completed. 10/07/2014

## 2015-04-30 ENCOUNTER — Other Ambulatory Visit: Payer: Self-pay | Admitting: Cardiovascular Disease

## 2015-09-22 ENCOUNTER — Telehealth: Payer: Self-pay | Admitting: Cardiovascular Disease

## 2015-09-22 DIAGNOSIS — E785 Hyperlipidemia, unspecified: Secondary | ICD-10-CM

## 2015-09-22 NOTE — Telephone Encounter (Signed)
New Message  Pt wanted lab work 4/3 before 4/11 appt w/ Dr Acie Fredrickson- no lab in syst. Please advise.

## 2015-09-23 NOTE — Telephone Encounter (Signed)
Left message for patient that fasting lab orders have been placed and he is scheduled for lab appointment on 4/3 prior to appointment with Dr. Acie Fredrickson on 4/11.  I advised he call back with questions or concerns.

## 2015-10-10 ENCOUNTER — Other Ambulatory Visit (INDEPENDENT_AMBULATORY_CARE_PROVIDER_SITE_OTHER): Payer: 59 | Admitting: *Deleted

## 2015-10-10 DIAGNOSIS — E785 Hyperlipidemia, unspecified: Secondary | ICD-10-CM

## 2015-10-10 LAB — COMPREHENSIVE METABOLIC PANEL WITH GFR
ALT: 32 U/L (ref 9–46)
AST: 23 U/L (ref 10–35)
Albumin: 4 g/dL (ref 3.6–5.1)
Alkaline Phosphatase: 44 U/L (ref 40–115)
BUN: 12 mg/dL (ref 7–25)
CO2: 28 mmol/L (ref 20–31)
Calcium: 8.5 mg/dL — ABNORMAL LOW (ref 8.6–10.3)
Chloride: 104 mmol/L (ref 98–110)
Creat: 0.92 mg/dL (ref 0.70–1.33)
Glucose, Bld: 88 mg/dL (ref 65–99)
Potassium: 4.4 mmol/L (ref 3.5–5.3)
Sodium: 140 mmol/L (ref 135–146)
Total Bilirubin: 0.6 mg/dL (ref 0.2–1.2)
Total Protein: 6.5 g/dL (ref 6.1–8.1)

## 2015-10-10 LAB — LIPID PANEL
Cholesterol: 142 mg/dL (ref 125–200)
HDL: 67 mg/dL
LDL Cholesterol: 62 mg/dL
Total CHOL/HDL Ratio: 2.1 ratio
Triglycerides: 67 mg/dL
VLDL: 13 mg/dL

## 2015-10-11 ENCOUNTER — Telehealth: Payer: Self-pay | Admitting: Cardiovascular Disease

## 2015-10-11 NOTE — Telephone Encounter (Signed)
Follow Up: ° ° ° °Returning your call from yesterday. °

## 2015-10-11 NOTE — Telephone Encounter (Signed)
Called patient back with lab results. 

## 2015-10-18 ENCOUNTER — Encounter: Payer: Self-pay | Admitting: Cardiovascular Disease

## 2015-10-18 ENCOUNTER — Ambulatory Visit (INDEPENDENT_AMBULATORY_CARE_PROVIDER_SITE_OTHER): Payer: 59 | Admitting: Cardiovascular Disease

## 2015-10-18 VITALS — BP 96/64 | HR 67 | Ht 75.0 in | Wt 193.8 lb

## 2015-10-18 DIAGNOSIS — E785 Hyperlipidemia, unspecified: Secondary | ICD-10-CM

## 2015-10-18 DIAGNOSIS — I341 Nonrheumatic mitral (valve) prolapse: Secondary | ICD-10-CM | POA: Diagnosis not present

## 2015-10-18 NOTE — Progress Notes (Signed)
Cardiology Office Note   Date:  10/18/2015   ID:  Christopher Tanner, DOB [birth year], MRN [id number]  PCP:  Donnajean Lopes, MD  Cardiologist:   Thayer Headings, MD   Chief Complaint  Patient presents with  . Follow-up    hyperlipidemia   1. Hyperlipidemia 2. Mitral valve prolapse  History of Present Illness:  Christopher Tanner is a 60 year old gentleman with a history of hyperlipidemia and mitral valve prolapse. He's done very well from a cardiac standpoint.  He has not had any episodes of chest pain or shortness of breath. He is exercising on a regular basis.  Feb. 5, 2014: He is doing well. He has had some shoulder issues and is having to scale back his exercise.     October 05, 2014:  Christopher Tanner is a 60 y.o. male who presents for his MVP. No CP , no dyspnea.  No symptoms related to MVP,   October 18, 2015:  Doing great.   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    Past Medical History  Diagnosis Date  . Hyperlipidemia   . Mitral valve prolapse     Last echocardiogram July, 2008. Moderate MVP of the posterior leaflet. Moderate mitral regurgitation. Normal left ventricular systolic function.  Orpah Cobb fistula     Past Surgical History  Procedure Laterality Date  . Hand broken      Left  . Colonoscopy       Current Outpatient Prescriptions  Medication Sig Dispense Refill  . aspirin 81 MG tablet Take 81 mg by mouth daily.      . Cholecalciferol ([facility]S VIT D 5000 HIGH-POTENCY) 5000 UNITS capsule Take 2 capsules (10,000 Units total) by mouth daily.    Marland Kitchen Co-Enzyme Q-10 10 MG CAPS Take 1 capsule (10 mg total) by mouth daily.  0  . Multiple Vitamin (MULTIVITAMIN) tablet Take 1 tablet by mouth daily.      . Multiple Vitamins-Minerals (MULTIVITAL PERFORMANCE) TABS Take 1 tablet by mouth daily.    . Omega-3 Fatty Acids (FISH OIL PO) Take by mouth daily.      Marland Kitchen ZETIA 10 MG tablet TAKE 1 TABLET (10 MG TOTAL) BY MOUTH DAILY. 30 tablet 5   No current  facility-administered medications for this visit.    Allergies:   Crestor; Ezetimibe-simvastatin; Simvastatin; and Niaspan    Social History:  The patient  reports that he has been smoking Cigars.  He does not have any smokeless tobacco history on file. He reports that he drinks alcohol. He reports that he does not use illicit drugs.   Family History:  The patient's family history includes Heart disease in his father.    ROS:  Please see the history of present illness.    Review of Systems: Constitutional:  denies fever, chills, diaphoresis, appetite change and fatigue.  HEENT: denies photophobia, eye pain, redness, hearing loss, ear pain, congestion, sore throat, rhinorrhea, sneezing, neck pain, neck stiffness and tinnitus.  Respiratory: denies SOB, DOE, cough, chest tightness, and wheezing.  Cardiovascular: denies chest pain, palpitations and leg swelling.  Gastrointestinal: denies nausea, vomiting, abdominal pain, diarrhea, constipation, blood in stool.  Genitourinary: denies dysuria, urgency, frequency, hematuria, flank pain and difficulty urinating.  Musculoskeletal: denies  myalgias, back pain, joint swelling, arthralgias and gait problem.   Skin: denies pallor, rash and wound.  Neurological: denies dizziness, seizures, syncope, weakness, light-headedness, numbness and headaches.   Hematological: denies adenopathy, easy bruising, personal or family bleeding history.  Psychiatric/ Behavioral: denies suicidal ideation, mood changes, confusion, nervousness, sleep disturbance and agitation.       All other systems are reviewed and negative.    PHYSICAL EXAM: VS:  BP 96/64 mmHg  Pulse 67  Ht 6\' 3"  (1.905 m)  Wt 193 lb 12.8 oz (87.907 kg)  BMI 24.22 kg/m2 , BMI Body mass index is 24.22 kg/(m^2). GEN: Well nourished, well developed, in no acute distress HEENT: normal Neck: no JVD, carotid bruits, or masses Cardiac: RR, 2/6  late systolic murmur  Respiratory:  clear to  auscultation bilaterally, normal work of breathing GI: soft, nontender, nondistended, + BS MS: no deformity or atrophy Skin: warm and dry, no rash Neuro:  Strength and sensation are intact Psych: normal   EKG:  EKG is ordered today. The ekg ordered today demonstrates NSR at 67.  PACs .    Normal ECG     Recent Labs: 10/10/2015: ALT 32; BUN 12; Creat 0.92; Potassium 4.4; Sodium 140    Lipid Panel    Component Value Date/Time   CHOL 142 10/10/2015 0825   CHOL 172 09/20/2014 0828   TRIG 67 10/10/2015 0825   TRIG 107 09/20/2014 0828   HDL 67 10/10/2015 0825   HDL 58 09/20/2014 0828   CHOLHDL 2.1 10/10/2015 0825   VLDL 13 10/10/2015 0825   LDLCALC 62 10/10/2015 0825   LDLCALC 93 09/20/2014 0828      Wt Readings from Last 3 Encounters:  10/18/15 193 lb 12.8 oz (87.907 kg)  10/05/14 192 lb 12.8 oz (87.454 kg)  09/29/13 190 lb 1.9 oz (86.238 kg)      Other studies Reviewed: Additional studies/ records that were reviewed today include: . Review of the above records demonstrates:    ASSESSMENT AND PLAN:  1.  Mitral valve prolapse:  Kieon is doing well. His echo from last year showed moderate MR associated with his prolapse of his posterior leaflet. Will get another echo next year with an office visit the week later.  Asked him to pay attention to symptoms .  2. Hyperlipidemia:  Labs look great  Continue Zetia.  recheck labs in 1 year    Current medicines are reviewed at length with the patient today.  The patient does not have concerns regarding medicines.  The following changes have been made:  no change  Labs/ tests ordered today include:  No orders of the defined types were placed in this encounter.     Disposition:   FU with me in 1 year      Rei Contee, [name], MD  10/18/2015 8:21 AM    [facility] [place], [place], [place]  [address] Phone: [phone]; Fax: [phone]

## 2015-10-18 NOTE — Patient Instructions (Signed)
Medication Instructions:  Your physician recommends that you continue on your current medications as directed. Please refer to the Current Medication list given to you today.   Labwork: Your physician recommends that you return for a FASTING lipid profile, lft, bmet in 1 year (To be done 1 week prior to office visit)   Testing/Procedures: Your physician has requested that you have an echocardiogram. Echocardiography is a painless test that uses sound waves to create images of your heart. It provides your doctor with information about the size and shape of your heart and how well your heart's chambers and valves are working. This procedure takes approximately one hour. There are no restrictions for this procedure. (To be scheduled in April 2018 1 week prior to office visit)  Follow-Up: Your physician wants you to follow-up in: 1 year with Dr.Nahser You will receive a reminder letter in the mail two months in advance. If you don't receive a letter, please call our office to schedule the follow-up appointment.   Any Other Special Instructions Will Be Listed Below (If Applicable).     If you need a refill on your cardiac medications before your next appointment, please call your pharmacy.

## 2015-11-06 ENCOUNTER — Other Ambulatory Visit: Payer: Self-pay | Admitting: Cardiovascular Disease

## 2016-07-27 ENCOUNTER — Telehealth: Payer: Self-pay | Admitting: Cardiovascular Disease

## 2016-09-05 NOTE — Telephone Encounter (Signed)
Closed encounter °

## 2016-09-18 ENCOUNTER — Other Ambulatory Visit: Payer: Self-pay | Admitting: Cardiovascular Disease

## 2016-10-09 ENCOUNTER — Other Ambulatory Visit: Payer: Self-pay

## 2016-10-09 ENCOUNTER — Other Ambulatory Visit: Payer: 59 | Admitting: *Deleted

## 2016-10-09 ENCOUNTER — Ambulatory Visit (HOSPITAL_COMMUNITY): Payer: 59 | Attending: Cardiology

## 2016-10-09 DIAGNOSIS — I081 Rheumatic disorders of both mitral and tricuspid valves: Secondary | ICD-10-CM | POA: Diagnosis not present

## 2016-10-09 DIAGNOSIS — I341 Nonrheumatic mitral (valve) prolapse: Secondary | ICD-10-CM | POA: Diagnosis not present

## 2016-10-09 DIAGNOSIS — E7800 Pure hypercholesterolemia, unspecified: Secondary | ICD-10-CM | POA: Diagnosis not present

## 2016-10-09 LAB — LIPID PANEL
CHOLESTEROL TOTAL: 190 mg/dL (ref 100–199)
Chol/HDL Ratio: 3.2 ratio (ref 0.0–5.0)
HDL: 60 mg/dL (ref >39)
LDL Calculated: 104 mg/dL — ABNORMAL HIGH (ref 0–99)
TRIGLYCERIDES: 132 mg/dL (ref 0–149)
VLDL Cholesterol Cal: 26 mg/dL (ref 5–40)

## 2016-10-09 LAB — HEPATIC FUNCTION PANEL
ALT: 44 IU/L (ref 0–44)
AST: 37 IU/L (ref 0–40)
Albumin: 4.2 g/dL (ref 3.6–4.8)
Alkaline Phosphatase: 50 IU/L (ref 39–117)
BILIRUBIN, DIRECT: 0.13 mg/dL (ref 0.00–0.40)
Bilirubin Total: 0.4 mg/dL (ref 0.0–1.2)
Total Protein: 6.5 g/dL (ref 6.0–8.5)

## 2016-10-09 LAB — BASIC METABOLIC PANEL
BUN / CREAT RATIO: 18 (ref 10–24)
BUN: 18 mg/dL (ref 8–27)
CALCIUM: 8.7 mg/dL (ref 8.6–10.2)
CO2: 25 mmol/L (ref 18–29)
Chloride: 100 mmol/L (ref 96–106)
Creatinine, Ser: 0.99 mg/dL (ref 0.76–1.27)
GFR, EST AFRICAN AMERICAN: 95 mL/min/{1.73_m2} (ref >59)
GFR, EST NON AFRICAN AMERICAN: 82 mL/min/{1.73_m2} (ref >59)
Glucose: 92 mg/dL (ref 65–99)
Potassium: 4.6 mmol/L (ref 3.5–5.2)
SODIUM: 140 mmol/L (ref 134–144)

## 2016-10-12 ENCOUNTER — Encounter: Payer: Self-pay | Admitting: Cardiovascular Disease

## 2016-10-31 ENCOUNTER — Encounter: Payer: Self-pay | Admitting: Cardiovascular Disease

## 2016-10-31 ENCOUNTER — Ambulatory Visit (INDEPENDENT_AMBULATORY_CARE_PROVIDER_SITE_OTHER): Payer: 59 | Admitting: Cardiovascular Disease

## 2016-10-31 VITALS — BP 100/74 | HR 66 | Ht 75.0 in | Wt 199.0 lb

## 2016-10-31 DIAGNOSIS — E782 Mixed hyperlipidemia: Secondary | ICD-10-CM

## 2016-10-31 DIAGNOSIS — I341 Nonrheumatic mitral (valve) prolapse: Secondary | ICD-10-CM

## 2016-10-31 NOTE — Progress Notes (Signed)
Cardiology Office Note   Date:  10/31/2016   ID:  Tanner, Christopher 1955-12-09, MRN [id number]  PCP:  Donnajean Lopes, MD  Cardiologist:   Mertie Moores, MD   Chief Complaint  Patient presents with  . Hyperlipidemia   1. Hyperlipidemia 2. Mitral valve prolapse  History of Present Illness:  [name] is a 61 year old gentleman with a history of hyperlipidemia and mitral valve prolapse. He's done very well from a cardiac standpoint.  He has not had any episodes of chest pain or shortness of breath. He is exercising on a regular basis.  Feb. 5, 2014: He is doing well. He has had some shoulder issues and is having to scale back his exercise.     October 05, 2014:  Christopher Tanner is a 62 y.o. male who presents for his MVP. No CP , no dyspnea.  No symptoms related to MVP,   October 18, 2015:  Doing great.   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  October 31, 2016:  Christopher Tanner is seen today for his yearly visit.  He has a hx of hyperlipidemia  Exercising some .  Spin class on occasion.       Past Medical History:  Diagnosis Date  . Anal fistula   . Hyperlipidemia   . Mitral valve prolapse    Last echocardiogram July, 2008. Moderate MVP of the posterior leaflet. Moderate mitral regurgitation. Normal left ventricular systolic function.    Past Surgical History:  Procedure Laterality Date  . COLONOSCOPY    . hand broken     Left     Current Outpatient Prescriptions  Medication Sig Dispense Refill  . aspirin 81 MG tablet Take 81 mg by mouth daily.      . Cholecalciferol ([facility]S VIT D 5000 HIGH-POTENCY) 5000 UNITS capsule Take 2 capsules (10,000 Units total) by mouth daily.    Marland Kitchen Co-Enzyme Q-10 10 MG CAPS Take 1 capsule (10 mg total) by mouth daily.  0  . ezetimibe (ZETIA) 10 MG tablet TAKE 1 TABLET (10 MG TOTAL) BY MOUTH DAILY. 30 tablet 1  . Multiple Vitamins-Minerals (MULTIVITAL PERFORMANCE) TABS Take 1 tablet by mouth daily.    . Omega-3  Fatty Acids (FISH OIL PO) Take by mouth daily.      . sildenafil (REVATIO) 20 MG tablet Take 20 mg by mouth daily as needed. FOR SEXUAL ACTIVITY  12   No current facility-administered medications for this visit.     Allergies:   Crestor [rosuvastatin calcium]; Ezetimibe-simvastatin; Simvastatin; and Niaspan [niacin]    Social History:  The patient  reports that he has been smoking Cigars.  He has never used smokeless tobacco. He reports that he drinks alcohol. He reports that he does not use drugs.   Family History:  The patient's family history includes Heart disease in his father.    ROS:  Please see the history of present illness.    Review of Systems: Constitutional:  denies fever, chills, diaphoresis, appetite change and fatigue.  HEENT: denies photophobia, eye pain, redness, hearing loss, ear pain, congestion, sore throat, rhinorrhea, sneezing, neck pain, neck stiffness and tinnitus.  Respiratory: denies SOB, DOE, cough, chest tightness, and wheezing.  Cardiovascular: denies chest pain, palpitations and leg swelling.  Gastrointestinal: denies nausea, vomiting, abdominal pain, diarrhea, constipation, blood in stool.  Genitourinary: denies dysuria, urgency, frequency, hematuria, flank pain and difficulty urinating.  Musculoskeletal: denies  myalgias, back pain, joint swelling, arthralgias and gait problem.  Skin: denies pallor, rash and wound.  Neurological: denies dizziness, seizures, syncope, weakness, light-headedness, numbness and headaches.   Hematological: denies adenopathy, easy bruising, personal or family bleeding history.  Psychiatric/ Behavioral: denies suicidal ideation, mood changes, confusion, nervousness, sleep disturbance and agitation.       All other systems are reviewed and negative.    PHYSICAL EXAM: VS:  Ht 6\' 3"  (1.905 m)   Wt 199 lb (90.3 kg)   BMI 24.87 kg/m  , BMI Body mass index is 24.87 kg/m. GEN: Well nourished, well developed, in no acute  distress  HEENT: normal  Neck: no JVD, carotid bruits, or masses Cardiac: RR, 2/6  late systolic murmur - radiation to the axilla  Respiratory:  clear to auscultation bilaterally, normal work of breathing GI: soft, nontender, nondistended, + BS MS: no deformity or atrophy  Skin: warm and dry, no rash Neuro:  Strength and sensation are intact Psych: normal   EKG:  EKG is ordered today. The ekg ordered today demonstrates NSR at 66.      Normal ECG     Recent Labs: 10/09/2016: ALT 44; BUN 18; Creatinine, Ser 0.99; Potassium 4.6; Sodium 140    Lipid Panel    Component Value Date/Time   CHOL 190 10/09/2016 0811   CHOL 172 09/20/2014 0828   TRIG 132 10/09/2016 0811   TRIG 107 09/20/2014 0828   HDL 60 10/09/2016 0811   HDL 58 09/20/2014 0828   CHOLHDL 3.2 10/09/2016 0811   CHOLHDL 2.1 10/10/2015 0825   VLDL 13 10/10/2015 0825   LDLCALC 104 (H) 10/09/2016 0811   LDLCALC 93 09/20/2014 0828      Wt Readings from Last 3 Encounters:  10/31/16 199 lb (90.3 kg)  10/18/15 193 lb 12.8 oz (87.9 kg)  10/05/14 192 lb 12.8 oz (87.5 kg)      Other studies Reviewed: Additional studies/ records that were reviewed today include: . Review of the above records demonstrates:    ASSESSMENT AND PLAN:  1.  Mitral valve prolapse:  Chon is doing well. His echo from April 2018 showed moderate MR associated with his prolapse of his posterior leaflet.  I have reassured him that he does not need surgery any time soon.   We discussed repair / approach and discussed having him see Dr. Roxy Manns later when he develops signs or symptoms that are concerning  Asked him to pay attention to symptoms .  2. Hyperlipidemia:  Labs look great  Continue Zetia.  recheck labs in 1 year    Current medicines are reviewed at length with the patient today.  The patient does not have concerns regarding medicines.  The following changes have been made:  no change  Labs/ tests ordered today include:  No orders of  the defined types were placed in this encounter.    Disposition:   FU with me in 1 year      Mertie Moores, MD  10/31/2016 8:34 AM    [facility] [place], [place], [place]  [address] Phone: [phone]; Fax: [phone]

## 2016-10-31 NOTE — Patient Instructions (Signed)
Medication Instructions:  Your physician recommends that you continue on your current medications as directed. Please refer to the Current Medication list given to you today.   Labwork: None Ordered   Testing/Procedures: None Ordered   Follow-Up: Your physician wants you to follow-up in: 1 year with Dr. Nahser.  You will receive a reminder letter in the mail two months in advance. If you don't receive a letter, please call our office to schedule the follow-up appointment.   If you need a refill on your cardiac medications before your next appointment, please call your pharmacy.   Thank you for choosing [facility]! Tagan Bartram, RN [phone]    

## 2016-11-25 ENCOUNTER — Other Ambulatory Visit: Payer: Self-pay | Admitting: Cardiovascular Disease

## 2016-12-07 HISTORY — PX: SHOULDER SURGERY: SHX246

## 2016-12-12 DIAGNOSIS — M7542 Impingement syndrome of left shoulder: Secondary | ICD-10-CM | POA: Diagnosis not present

## 2016-12-12 DIAGNOSIS — M19012 Primary osteoarthritis, left shoulder: Secondary | ICD-10-CM | POA: Diagnosis not present

## 2017-01-04 DIAGNOSIS — I890 Lymphedema, not elsewhere classified: Secondary | ICD-10-CM | POA: Diagnosis not present

## 2017-01-04 DIAGNOSIS — M19012 Primary osteoarthritis, left shoulder: Secondary | ICD-10-CM | POA: Diagnosis not present

## 2017-01-04 DIAGNOSIS — G8918 Other acute postprocedural pain: Secondary | ICD-10-CM | POA: Diagnosis not present

## 2017-01-04 DIAGNOSIS — M7522 Bicipital tendinitis, left shoulder: Secondary | ICD-10-CM | POA: Diagnosis not present

## 2017-01-04 DIAGNOSIS — M7542 Impingement syndrome of left shoulder: Secondary | ICD-10-CM | POA: Diagnosis not present

## 2017-01-18 DIAGNOSIS — M7542 Impingement syndrome of left shoulder: Secondary | ICD-10-CM | POA: Diagnosis not present

## 2017-01-21 DIAGNOSIS — M7542 Impingement syndrome of left shoulder: Secondary | ICD-10-CM | POA: Diagnosis not present

## 2017-01-25 DIAGNOSIS — M7542 Impingement syndrome of left shoulder: Secondary | ICD-10-CM | POA: Diagnosis not present

## 2017-01-28 DIAGNOSIS — I890 Lymphedema, not elsewhere classified: Secondary | ICD-10-CM | POA: Diagnosis not present

## 2017-01-28 DIAGNOSIS — M19012 Primary osteoarthritis, left shoulder: Secondary | ICD-10-CM | POA: Diagnosis not present

## 2017-01-28 DIAGNOSIS — M7542 Impingement syndrome of left shoulder: Secondary | ICD-10-CM | POA: Diagnosis not present

## 2017-01-29 DIAGNOSIS — M7542 Impingement syndrome of left shoulder: Secondary | ICD-10-CM | POA: Diagnosis not present

## 2017-01-31 DIAGNOSIS — M7542 Impingement syndrome of left shoulder: Secondary | ICD-10-CM | POA: Diagnosis not present

## 2017-02-05 DIAGNOSIS — M7542 Impingement syndrome of left shoulder: Secondary | ICD-10-CM | POA: Diagnosis not present

## 2017-02-08 DIAGNOSIS — M7542 Impingement syndrome of left shoulder: Secondary | ICD-10-CM | POA: Diagnosis not present

## 2017-02-18 DIAGNOSIS — M7542 Impingement syndrome of left shoulder: Secondary | ICD-10-CM | POA: Diagnosis not present

## 2017-02-21 DIAGNOSIS — M7542 Impingement syndrome of left shoulder: Secondary | ICD-10-CM | POA: Diagnosis not present

## 2017-02-22 DIAGNOSIS — Z0000 Encounter for general adult medical examination without abnormal findings: Secondary | ICD-10-CM | POA: Diagnosis not present

## 2017-02-25 DIAGNOSIS — M7542 Impingement syndrome of left shoulder: Secondary | ICD-10-CM | POA: Diagnosis not present

## 2017-02-27 DIAGNOSIS — Z0000 Encounter for general adult medical examination without abnormal findings: Secondary | ICD-10-CM | POA: Diagnosis not present

## 2017-02-27 DIAGNOSIS — I341 Nonrheumatic mitral (valve) prolapse: Secondary | ICD-10-CM | POA: Diagnosis not present

## 2017-02-27 DIAGNOSIS — E784 Other hyperlipidemia: Secondary | ICD-10-CM | POA: Diagnosis not present

## 2017-02-27 DIAGNOSIS — M25512 Pain in left shoulder: Secondary | ICD-10-CM | POA: Diagnosis not present

## 2017-02-27 DIAGNOSIS — Z1389 Encounter for screening for other disorder: Secondary | ICD-10-CM | POA: Diagnosis not present

## 2017-02-28 DIAGNOSIS — M7542 Impingement syndrome of left shoulder: Secondary | ICD-10-CM | POA: Diagnosis not present

## 2017-03-04 DIAGNOSIS — M7542 Impingement syndrome of left shoulder: Secondary | ICD-10-CM | POA: Diagnosis not present

## 2017-03-05 DIAGNOSIS — Z1212 Encounter for screening for malignant neoplasm of rectum: Secondary | ICD-10-CM | POA: Diagnosis not present

## 2017-03-07 DIAGNOSIS — M7542 Impingement syndrome of left shoulder: Secondary | ICD-10-CM | POA: Diagnosis not present

## 2017-03-12 DIAGNOSIS — M7542 Impingement syndrome of left shoulder: Secondary | ICD-10-CM | POA: Diagnosis not present

## 2017-03-13 DIAGNOSIS — H524 Presbyopia: Secondary | ICD-10-CM | POA: Diagnosis not present

## 2017-04-10 DIAGNOSIS — Z4789 Encounter for other orthopedic aftercare: Secondary | ICD-10-CM | POA: Diagnosis not present

## 2017-04-23 DIAGNOSIS — L821 Other seborrheic keratosis: Secondary | ICD-10-CM | POA: Diagnosis not present

## 2017-04-23 DIAGNOSIS — L820 Inflamed seborrheic keratosis: Secondary | ICD-10-CM | POA: Diagnosis not present

## 2017-04-23 DIAGNOSIS — D225 Melanocytic nevi of trunk: Secondary | ICD-10-CM | POA: Diagnosis not present

## 2017-04-25 DIAGNOSIS — H10412 Chronic giant papillary conjunctivitis, left eye: Secondary | ICD-10-CM | POA: Diagnosis not present

## 2017-05-06 DIAGNOSIS — M7541 Impingement syndrome of right shoulder: Secondary | ICD-10-CM | POA: Diagnosis not present

## 2017-05-06 DIAGNOSIS — M25511 Pain in right shoulder: Secondary | ICD-10-CM | POA: Diagnosis not present

## 2017-05-06 DIAGNOSIS — Z4789 Encounter for other orthopedic aftercare: Secondary | ICD-10-CM | POA: Diagnosis not present

## 2017-05-06 DIAGNOSIS — M7542 Impingement syndrome of left shoulder: Secondary | ICD-10-CM | POA: Diagnosis not present

## 2017-08-27 DIAGNOSIS — D225 Melanocytic nevi of trunk: Secondary | ICD-10-CM | POA: Diagnosis not present

## 2017-08-27 DIAGNOSIS — L821 Other seborrheic keratosis: Secondary | ICD-10-CM | POA: Diagnosis not present

## 2017-08-29 ENCOUNTER — Telehealth: Payer: Self-pay | Admitting: Cardiovascular Disease

## 2017-08-29 NOTE — Telephone Encounter (Signed)
Spoke with patient to ask if he is having any worsening symptoms of mitral valve prolapse or other concerns, patient denies. I advised that he can come to his office visit on 5/17 at 0800 in a fasting state for lab work and that Dr. Acie Fredrickson will discuss timing of next echo with him, likely in 1 year.  Patient verbalized understanding and agreement with plan and thanked me for the call.

## 2017-08-29 NOTE — Telephone Encounter (Signed)
Looks like we have been getting echos every 2 years or so. Last echo was in April 2018.   I'll see him first and make a decision about it at the office visit

## 2017-08-29 NOTE — Telephone Encounter (Signed)
Routing to Dr. Acie Fredrickson to advise on when repeat echo is needed

## 2017-08-29 NOTE — Telephone Encounter (Signed)
Patient calling, states that he usually has labs completed before appointment. Patient would also like to know if he needs an echo before next f/u visit.

## 2017-10-06 ENCOUNTER — Other Ambulatory Visit: Payer: Self-pay | Admitting: Cardiovascular Disease

## 2017-10-07 NOTE — Telephone Encounter (Signed)
Order Providers   Prescribing Provider Encounter Provider  Nahser, [name], MD Nahser, [name], MD  Outpatient Medication Detail    Disp Refills Start End   ezetimibe (ZETIA) 10 MG tablet 30 tablet 11 11/26/2016    Sig: TAKE 1 TABLET (10 MG TOTAL) BY MOUTH DAILY.   Sent to pharmacy as: ezetimibe (ZETIA) 10 MG tablet   E-Prescribing Status: Receipt confirmed by pharmacy (11/26/2016 11:34 AM EDT)   Pharmacy   [id number] - [place], [place]

## 2017-11-05 ENCOUNTER — Encounter: Payer: Self-pay | Admitting: Cardiovascular Disease

## 2017-11-18 ENCOUNTER — Other Ambulatory Visit: Payer: Self-pay | Admitting: Cardiovascular Disease

## 2017-11-21 DIAGNOSIS — Z23 Encounter for immunization: Secondary | ICD-10-CM | POA: Diagnosis not present

## 2017-11-22 ENCOUNTER — Encounter: Payer: Self-pay | Admitting: Cardiovascular Disease

## 2017-11-22 ENCOUNTER — Ambulatory Visit (INDEPENDENT_AMBULATORY_CARE_PROVIDER_SITE_OTHER): Payer: 59 | Admitting: Cardiovascular Disease

## 2017-11-22 VITALS — BP 102/72 | HR 65 | Ht 75.0 in | Wt 194.0 lb

## 2017-11-22 DIAGNOSIS — I341 Nonrheumatic mitral (valve) prolapse: Secondary | ICD-10-CM

## 2017-11-22 DIAGNOSIS — E782 Mixed hyperlipidemia: Secondary | ICD-10-CM | POA: Diagnosis not present

## 2017-11-22 LAB — LIPID PANEL
Chol/HDL Ratio: 2.7 ratio (ref 0.0–5.0)
Cholesterol, Total: 181 mg/dL (ref 100–199)
HDL: 66 mg/dL (ref >39)
LDL Calculated: 95 mg/dL (ref 0–99)
TRIGLYCERIDES: 100 mg/dL (ref 0–149)
VLDL Cholesterol Cal: 20 mg/dL (ref 5–40)

## 2017-11-22 LAB — BASIC METABOLIC PANEL
BUN / CREAT RATIO: 16 (ref 10–24)
BUN: 17 mg/dL (ref 8–27)
CO2: 24 mmol/L (ref 20–29)
Calcium: 9.2 mg/dL (ref 8.6–10.2)
Chloride: 97 mmol/L (ref 96–106)
Creatinine, Ser: 1.07 mg/dL (ref 0.76–1.27)
GFR, EST AFRICAN AMERICAN: 86 mL/min/{1.73_m2} (ref >59)
GFR, EST NON AFRICAN AMERICAN: 75 mL/min/{1.73_m2} (ref >59)
Glucose: 93 mg/dL (ref 65–99)
POTASSIUM: 4.8 mmol/L (ref 3.5–5.2)
SODIUM: 136 mmol/L (ref 134–144)

## 2017-11-22 LAB — HEPATIC FUNCTION PANEL
ALK PHOS: 60 IU/L (ref 39–117)
ALT: 29 IU/L (ref 0–44)
AST: 27 IU/L (ref 0–40)
Albumin: 4.5 g/dL (ref 3.6–4.8)
BILIRUBIN, DIRECT: 0.26 mg/dL (ref 0.00–0.40)
Bilirubin Total: 1 mg/dL (ref 0.0–1.2)
TOTAL PROTEIN: 6.9 g/dL (ref 6.0–8.5)

## 2017-11-22 MED ORDER — EZETIMIBE 10 MG PO TABS: ORAL_TABLET | ORAL | 3 refills | Status: DC

## 2017-11-22 NOTE — Patient Instructions (Signed)
Medication Instructions:  Your physician recommends that you continue on your current medications as directed. Please refer to the Current Medication list given to you today.   Labwork: Your physician recommends that you return for lab work today (BMET, LFTS, LIPIDS) [place] 1 YEAR (BMET, LFTS, LIPIDS)   Testing/Procedures: Your physician has requested that you have an echocardiogram. Echocardiography is a painless test that uses sound waves to create images of your heart. It provides your doctor with information about the size and shape of your heart and how well your heart's chambers and valves are working. This procedure takes approximately one hour. There are no restrictions for this procedure. ECHO SHOULD BE SCHEDULED FOR IN 1 YEAR, PRIOR TO NEXT VISIT WITH DR. Acie Fredrickson.   Follow-Up: Your physician wants you to follow-up in: [name] Acie Fredrickson. You will receive a reminder letter in the mail two months in advance. If you don't receive a letter, please call our office to schedule the follow-up appointment.   Any Other Special Instructions Will Be Listed Below (If Applicable).     If you need a refill on your cardiac medications before your next appointment, please call your pharmacy.

## 2017-11-22 NOTE — Progress Notes (Signed)
Cardiology Office Note   Date:  11/22/2017   ID:  Christopher Tanner, DOB 05/23/1956, MRN [id number]  PCP:  Leanna Battles, MD  Cardiologist:   Mertie Moores, MD   Chief Complaint  Patient presents with  . Mitral Valve Prolapse  . Hyperlipidemia   1. Hyperlipidemia 2. Mitral valve prolapse    Christopher Tanner is a 62 y.o.  gentleman with a history of hyperlipidemia and mitral valve prolapse. He's done very well from a cardiac standpoint.  He has not had any episodes of chest pain or shortness of breath. He is exercising on a regular basis.  Feb. 5, 2014: He is doing well. He has had some shoulder issues and is having to scale back his exercise.     October 05, 2014:  Christopher Tanner is a 62 y.o. male who presents for his MVP. No CP , no dyspnea.  No symptoms related to MVP,   October 18, 2015:  Doing great.   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  October 31, 2016:  Christopher Tanner is seen today for his yearly visit.  He has a hx of hyperlipidemia  Exercising some .  Spin class on occasion.   Nov 22, 2017:  Christopher Tanner is seen today for follow-up visit.  He has a history of mitral valve prolapse and hyperlipidemia.  Feeling well , no dyspnea, no dizziness,  Goes to spin class.  Hikes, walks, jogs once a week.   Past Medical History:  Diagnosis Date  . Anal fistula   . Hyperlipidemia   . Mitral valve prolapse    Last echocardiogram July, 2008. Moderate MVP of the posterior leaflet. Moderate mitral regurgitation. Normal left ventricular systolic function.    Past Surgical History:  Procedure Laterality Date  . COLONOSCOPY    . hand broken     Left     Current Outpatient Medications  Medication Sig Dispense Refill  . aspirin 81 MG tablet Take 81 mg by mouth daily.      . Cholecalciferol ([facility]S VIT D 5000 HIGH-POTENCY) 5000 UNITS capsule Take 2 capsules (10,000 Units total) by mouth daily.    Marland Kitchen Co-Enzyme Q-10 10 MG CAPS Take 1 capsule (10 mg total) by mouth  daily.  0  . ezetimibe (ZETIA) 10 MG tablet TAKE 1 TABLET (10 MG TOTAL) BY MOUTH DAILY. 30 tablet 1  . Multiple Vitamins-Minerals (MULTIVITAL PERFORMANCE) TABS Take 1 tablet by mouth daily.    . Omega-3 Fatty Acids (FISH OIL PO) Take by mouth daily.      . sildenafil (REVATIO) 20 MG tablet Take 20 mg by mouth daily as needed. FOR SEXUAL ACTIVITY  12   No current facility-administered medications for this visit.     Allergies:   Crestor [rosuvastatin calcium]; Ezetimibe-simvastatin; Simvastatin; and Niaspan [niacin]    Social History:  The patient  reports that he has been smoking cigars.  He has never used smokeless tobacco. He reports that he drinks alcohol. He reports that he does not use drugs.   Family History:  The patient's family history includes Heart disease in his father.    ROS: Noted in current history, otherwise review of systems is negative.   Physical Exam: Blood pressure 102/72, pulse 65, height 6\' 3"  (1.905 m), weight 194 lb (88 kg), SpO2 95 %.  GEN:  Well nourished, well developed in no acute distress HEENT: Normal NECK: No JVD; No carotid bruits LYMPHATICS: No lymphadenopathy CARDIAC: RRR, midsystolic click.  Soft ,  brief systolic murmur. RESPIRATORY:  Clear to auscultation without rales, wheezing or rhonchi  ABDOMEN: Soft, non-tender, non-distended MUSCULOSKELETAL:  No edema; No deformity  SKIN: Warm and dry NEUROLOGIC:  Alert and oriented x 3   EKG:   Nov 22, 2017: Normal sinus rhythm at 65.  Occasional premature atrial contractions.  Early repolarization.  Otherwise the EKG is normal.   Recent Labs: No results found for requested labs within last 8760 hours.    Lipid Panel    Component Value Date/Time   CHOL 190 10/09/2016 0811   CHOL 172 09/20/2014 0828   TRIG 132 10/09/2016 0811   TRIG 107 09/20/2014 0828   HDL 60 10/09/2016 0811   HDL 58 09/20/2014 0828   CHOLHDL 3.2 10/09/2016 0811   CHOLHDL 2.1 10/10/2015 0825   VLDL 13 10/10/2015 0825    LDLCALC 104 (H) 10/09/2016 0811   LDLCALC 93 09/20/2014 0828      Wt Readings from Last 3 Encounters:  11/22/17 194 lb (88 kg)  10/31/16 199 lb (90.3 kg)  10/18/15 193 lb 12.8 oz (87.9 kg)      Other studies Reviewed: Additional studies/ records that were reviewed today include: . Review of the above records demonstrates:    ASSESSMENT AND PLAN:  1.  Mitral valve prolapse:    Stable.   Has severe prolapse of his posterior leaflet with moderate MR.  Repeat echo in 1 year   2. Hyperlipidemia: Continue Zetia.  Check labs today.  Current medicines are reviewed at length with the patient today.  The patient does not have concerns regarding medicines.  The following changes have been made:  no change  Labs/ tests ordered today include:  No orders of the defined types were placed in this encounter.    Disposition:   FU with me in 1 year      Mertie Moores, MD  11/22/2017 8:22 AM    [facility] [place], [place], [place]  [address] Phone: [phone]; Fax: [phone]

## 2018-01-25 DIAGNOSIS — Z23 Encounter for immunization: Secondary | ICD-10-CM | POA: Diagnosis not present

## 2018-02-18 ENCOUNTER — Ambulatory Visit (INDEPENDENT_AMBULATORY_CARE_PROVIDER_SITE_OTHER): Payer: 59 | Admitting: Sports Medicine

## 2018-02-18 ENCOUNTER — Encounter: Payer: Self-pay | Admitting: Sports Medicine

## 2018-02-18 DIAGNOSIS — M79671 Pain in right foot: Secondary | ICD-10-CM

## 2018-02-18 NOTE — Assessment & Plan Note (Signed)
HEP with some heel raises Trial with sports insoles and lateral heel wedge We want to see if this will change pressure pattern on gait  Icing  We will consider other intervention is not improving in 2 to 4 weeks

## 2018-02-18 NOTE — Progress Notes (Signed)
   Subjective:    Patient ID: Christopher Tanner, male    DOB: [birth year], 62 y.o.   MRN: [id number]  HPI  62 yo male presents with new-onset right heel pain.  Approximately 3 weeks ago, he went on a 9-mi hike. No pain or injury during hike but woke up the next morning with 8/10 right heel pain. Denies any swelling, ecchymosis, warmth or redness of joint. Denies any associated ankle pain, knee pain. No new sensory changes or weakness but does report some chronic tingling sensation in toes bilaterally. No previous injuries to right foot. Has been massaging bottom of foot with tennis ball. Reports some slow improvement over the past couple of weeks. Pain about 5/10 in the morning, improves to 2-3/10 with activity throughout the day. Has not tried ice or consistent NSAIDs. Enjoys spinning 4-5x weekly, running 1-2x weekly. Denies any new activities.  Review of Systems per HPI     Objective:   Physical Exam  General: well-appearing, no acute distress BP 108/68   Ht 6\' 3"  (1.905 m)   Wt 200 lb (90.7 kg)   BMI 25.00 kg/m   MSK:  Right foot - Calcaneus valgus noted with stance. Bunionette present. No swelling or erythema. Tender over base of calcaneus, most notably over lateral portion. Non-tender over medial portion of calcaneus. Mild tenderness along proximal plantar fascia. No tenderness along Achilles. No pain with resisted plantar-, dorsi-flexion or inversion/eversion. Full ROM. Strength 5/5. Negative Thompson.  Gait: Significant weight-bearing with supinated heel strike and foot turn out    Assessment & Plan:   Heel pain following recent 9-mile hike. Exam revealing lateral calcaneal pain and tenderness suggestive of os calcis pain/contusion. He has fairly pronounce calcaneal valgus bilaterally which is likely contributing to increased forces along lateral compartment. Exam less consistent with plantar fasciitis at this point. Green sport insole with lateral heel pad given today. Also recommend  some eccentric calf exercises. Can use ice, NSAIDs as needed. RTC in 4-6 weeks if no improvement.

## 2018-02-28 DIAGNOSIS — Z0000 Encounter for general adult medical examination without abnormal findings: Secondary | ICD-10-CM | POA: Diagnosis not present

## 2018-02-28 DIAGNOSIS — R82998 Other abnormal findings in urine: Secondary | ICD-10-CM | POA: Diagnosis not present

## 2018-03-06 DIAGNOSIS — Z1389 Encounter for screening for other disorder: Secondary | ICD-10-CM | POA: Diagnosis not present

## 2018-03-06 DIAGNOSIS — Z0000 Encounter for general adult medical examination without abnormal findings: Secondary | ICD-10-CM | POA: Diagnosis not present

## 2018-03-06 DIAGNOSIS — Z23 Encounter for immunization: Secondary | ICD-10-CM | POA: Diagnosis not present

## 2018-03-06 DIAGNOSIS — M722 Plantar fascial fibromatosis: Secondary | ICD-10-CM | POA: Diagnosis not present

## 2018-03-06 DIAGNOSIS — R972 Elevated prostate specific antigen [PSA]: Secondary | ICD-10-CM | POA: Diagnosis not present

## 2018-03-07 DIAGNOSIS — Z1212 Encounter for screening for malignant neoplasm of rectum: Secondary | ICD-10-CM | POA: Diagnosis not present

## 2018-03-18 ENCOUNTER — Encounter

## 2018-03-18 ENCOUNTER — Encounter: Payer: Self-pay | Admitting: Sports Medicine

## 2018-03-18 ENCOUNTER — Ambulatory Visit
Admission: RE | Admit: 2018-03-18 | Discharge: 2018-03-18 | Disposition: A | Discharge status: Home / Self Care | Payer: 59 | Source: Ambulatory Visit | Attending: Sports Medicine | Admitting: Sports Medicine

## 2018-03-18 ENCOUNTER — Ambulatory Visit (INDEPENDENT_AMBULATORY_CARE_PROVIDER_SITE_OTHER): Payer: 59 | Admitting: Sports Medicine

## 2018-03-18 VITALS — BP 116/80 | Ht 75.0 in | Wt 200.0 lb

## 2018-03-18 DIAGNOSIS — M79671 Pain in right foot: Secondary | ICD-10-CM | POA: Diagnosis not present

## 2018-03-18 DIAGNOSIS — M7731 Calcaneal spur, right foot: Secondary | ICD-10-CM | POA: Diagnosis not present

## 2018-03-18 MED ORDER — VITAMIN B-6 50 MG PO TABS: 50.0000 mg | ORAL_TABLET | Freq: Every day | ORAL | 2 refills | Status: DC

## 2018-03-18 MED ORDER — AMITRIPTYLINE HCL 25 MG PO TABS: 25.0000 mg | ORAL_TABLET | Freq: Every day | ORAL | 1 refills | Status: DC

## 2018-03-18 NOTE — Assessment & Plan Note (Signed)
Continued pain on lateral aspect of calcaneus.  Suspect likely 2/2 peroneal nerve irritation given distribution of pain over heel and 2nd-5th toes.  Provided soft heel cups for cushioning and to keep pressure off.  Would recommend vitamin B6 for symptoms of numbness and tingling, will obtain plain films of foot for further evaluation  -f/u R foot x-ray  -Rx: vitamin B6 50 mg daily  -Rx: Amitriptyline 25 mg QHS to help with nerve pain  -advised to continue home exercise program  -could consider orthotics at time of hiking trip if needed

## 2018-03-18 NOTE — Progress Notes (Addendum)
Subjective:   Patient ID: Christopher Tanner    DOB: April 24, 1956, 62 y.o. male   MRN: [id number]  CC: f/u R heel pain  HPI: Christopher Tanner is a 62 y.o. male who presents to clinic today for the following issue.  F/u right heel pain Previously seen by Dr. Oneida Alar on 8/13 and advised to wear green inserts with lateral heel wedge to see if changing the pressure pattern on his gait may be of benefit.  He was also advised to apply ice and do home exercise program with heel raises.  He has only applied ice once but has been consistent with home exercises, doing calf raises and walking backwards.  He notes no significant worsening or improvement since last visit and pain is about the same.  He feels pain particularly with driving as he rests his heel on the sore spot.  He also endorses some numbness in his right 2nd-5th toes.  He has taken Ibuprofen a few times but does not regularly.  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  Pain is worse with prolonged standing, walking and driving.  He is concerned as he has an upcoming hiking trip in [place] this January/February and would like to be able to go.     ROS: No fever, rash, warmth, swelling.  +numbness, tingling.   [place]: Pertinent past medical, surgical, family, and social history were reviewed and updated as appropriate. Smoking status reviewed. Medications reviewed. Objective:   BP 116/80   Ht 6\' 3"  (1.905 m)   Wt 200 lb (90.7 kg)   BMI 25.00 kg/m  Vitals and nursing note reviewed.  General: 62 yo male, NAD  Right foot:  No visible erythema or swelling. Range of motion is full in all directions. Strength is 5/5 in all directions. Stable lateral and medial ligaments.  +TTP over lateral aspect of calcaneus, no tenderness over insertion of plantar fascia,  no tenderness on posterior aspects of lateral and medial malleolus. Sensation intact. Normal gait.    Skin: warm, dry, no rash Neuro: alert, oriented x3, no  focal deficits   Ultrasound: bone spur noted over right calcaneus associated with mild surrounding swelling, no other bony abnormality present. Plantar fascia is normal. Peroneal tendons appear normal.  Assessment & Plan:   Pain of right heel Continued pain on lateral aspect of calcaneus.  Suspect likely 2/2 peroneal nerve irritation given distribution of pain over heel and 2nd-5th toes.  Provided soft heel cups for cushioning and to keep pressure off.  Would recommend vitamin B6 for symptoms of numbness and tingling, will obtain plain films of foot for further evaluation  -f/u R foot x-ray  -Rx: vitamin B6 50 mg daily  -Rx: Amitriptyline 25 mg QHS to help with nerve pain  -advised to continue home exercise program  -could consider orthotics at time of hiking trip if needed   Orders Placed This Encounter  Procedures  . DG Foot Complete Right    Standing Status:   Future    Number of Occurrences:   1    Standing Expiration Date:   05/19/2019    Order Specific Question:   Reason for Exam (SYMPTOM  OR DIAGNOSIS REQUIRED)    Answer:   right lateral heel pain    Order Specific Question:   Preferred imaging location?    Answer:   [facility]    Order Specific Question:   Radiology Contrast  Protocol - do NOT remove file path    Answer:   [email or web address]   Meds ordered this encounter  Medications  . pyridOXINE (VITAMIN B-6) 50 MG tablet    Sig: Take 1 tablet (50 mg total) by mouth daily.    Dispense:  90 tablet    Refill:  2  . amitriptyline (ELAVIL) 25 MG tablet    Sig: Take 1 tablet (25 mg total) by mouth at bedtime.    Dispense:  30 tablet    Refill:  1   Christopher Kim, MD [place] PGY-3  I reviewed XR which basically shows a sharp curved bone spur on calcaneus more lateral positioning. I called and reviewed with patient.  I observed and examined the patient with the resident and agree with assessment and plan.  Note  reviewed and modified by me. Christopher Mcgill, MD

## 2018-04-01 NOTE — Addendum Note (Signed)
Addended by: Cyd Silence on: 04/01/2018 09:03 AM   Modules accepted: Orders

## 2018-04-08 ENCOUNTER — Ambulatory Visit: Payer: 59 | Admitting: Sports Medicine

## 2018-04-12 ENCOUNTER — Ambulatory Visit
Admission: RE | Admit: 2018-04-12 | Discharge: 2018-04-12 | Disposition: A | Discharge status: Home / Self Care | Payer: 59 | Source: Ambulatory Visit | Attending: Sports Medicine | Admitting: Sports Medicine

## 2018-04-12 DIAGNOSIS — M79671 Pain in right foot: Secondary | ICD-10-CM | POA: Diagnosis not present

## 2018-04-16 ENCOUNTER — Encounter: Payer: Self-pay | Admitting: Family Medicine

## 2018-04-16 ENCOUNTER — Ambulatory Visit (INDEPENDENT_AMBULATORY_CARE_PROVIDER_SITE_OTHER): Payer: 59 | Admitting: Family Medicine

## 2018-04-16 DIAGNOSIS — M79671 Pain in right foot: Secondary | ICD-10-CM | POA: Diagnosis not present

## 2018-04-16 MED ORDER — METHYLPREDNISOLONE ACETATE 40 MG/ML IJ SUSP
40.0000 mg | Freq: Once | INTRAMUSCULAR | Status: AC
  Administered 2018-04-16: 40 mg via INTRA_ARTICULAR

## 2018-04-16 NOTE — Progress Notes (Signed)
PCP: Leanna Battles, MD  Subjective:   HPI: Patient is a 62 y.o. male here for right heel pain.  Patient returns today for right heel injection. He has been seeing Dr. Oneida Alar for pain plantar right heel - states mostly lateral aspect of the heel. Some associated numbness, tingling into toes mostly lateral 2nd-5th. Has noticed benefit with sports insoles with lateral heel wedges - requesting another pair today. He is an avid hiker, walks as well and has recently been on the spin bike without increase in pain. He had MRI of this heel on 10/5 showing severe plantar fasciitis with flexor hallucis longus tenosynovitis. Had discussed the results with Dr. Oneida Alar and discussion had regarding injection to help with pain as well.  Past Medical History:  Diagnosis Date  . Anal fistula   . Hyperlipidemia   . Mitral valve prolapse    Last echocardiogram July, 2008. Moderate MVP of the posterior leaflet. Moderate mitral regurgitation. Normal left ventricular systolic function.    Current Outpatient Medications on File Prior to Visit  Medication Sig Dispense Refill  . amitriptyline (ELAVIL) 25 MG tablet Take 1 tablet (25 mg total) by mouth at bedtime. 30 tablet 1  . aspirin 81 MG tablet Take 81 mg by mouth daily.      . Cholecalciferol ([employer]S VIT D 5000 HIGH-POTENCY) 5000 UNITS capsule Take 2 capsules (10,000 Units total) by mouth daily.    Marland Kitchen Co-Enzyme Q-10 10 MG CAPS Take 1 capsule (10 mg total) by mouth daily.  0  . ezetimibe (ZETIA) 10 MG tablet TAKE 1 TABLET (10 MG TOTAL) BY MOUTH DAILY. 90 tablet 3  . Multiple Vitamins-Minerals (MULTIVITAL PERFORMANCE) TABS Take 1 tablet by mouth daily.    . Omega-3 Fatty Acids (FISH OIL PO) Take by mouth daily.      Marland Kitchen pyridOXINE (VITAMIN B-6) 50 MG tablet Take 1 tablet (50 mg total) by mouth daily. 90 tablet 2  . sildenafil (REVATIO) 20 MG tablet Take 20 mg by mouth daily as needed. FOR SEXUAL ACTIVITY  12   No current facility-administered medications on  file prior to visit.     Past Surgical History:  Procedure Laterality Date  . COLONOSCOPY    . hand broken     Left    Allergies  Allergen Reactions  . Crestor [Rosuvastatin Calcium]     Leg aches  . Ezetimibe-Simvastatin     Stopped due to publicity about zetia  . Simvastatin     Memory Loss  . Niaspan [Niacin] Other (See Comments)    flushing    Social History   Socioeconomic History  . Marital status: Married    Spouse name: Not on file  . Number of children: Not on file  . Years of education: Not on file  . Highest education level: Not on file  Occupational History  . Not on file  Social Needs  . Financial resource strain: Not on file  . Food insecurity:    Worry: Not on file    Inability: Not on file  . Transportation needs:    Medical: Not on file    Non-medical: Not on file  Tobacco Use  . Smoking status: Light Tobacco Smoker    Types: Cigars  . Smokeless tobacco: Never Used  Substance and Sexual Activity  . Alcohol use: Yes  . Drug use: No  . Sexual activity: Not on file  Lifestyle  . Physical activity:    Days per week: Not on file  Minutes per session: Not on file  . Stress: Not on file  Relationships  . Social connections:    Talks on phone: Not on file    Gets together: Not on file    Attends religious service: Not on file    Active member of club or organization: Not on file    Attends meetings of clubs or organizations: Not on file    Relationship status: Not on file  . Intimate partner violence:    Fear of current or ex partner: Not on file    Emotionally abused: Not on file    Physically abused: Not on file    Forced sexual activity: Not on file  Other Topics Concern  . Not on file  Social History Narrative  . Not on file    Family History  Problem Relation Age of Onset  . Heart disease Father     BP 100/64   Ht 6\' 3"  (1.905 m)   Wt 200 lb (90.7 kg)   BMI 25.00 kg/m   Review of Systems: See HPI above.      Objective:  Physical Exam:  Gen: NAD, comfortable in exam room  Right foot/ankle: No gross deformity, swelling, ecchymoses FROM with 5/5 strength and no pain.  Full motion of great toe without pain on resisted motion, no pain with passive flexion or extension of great toe. TTP anterior plantar calcaneus at PF insertion. Negative ant drawer and talar tilt.   Negative syndesmotic compression. Negative calcaneal squeeze. Thompsons test negative. NV intact distally.   MSK u/s right heel:  Thickening and hypoechoic change of proximal plantar fascia with small calcaneal spur.    Assessment & Plan:  1. Right foot pain - 2/2 plantar fasciitis with small spur.  Made him another pair of sports insoles with lateral heel wedge today.  Injection given today with ultrasound guidance, discussed risk of rupture, bleeding, infection.  Icing, wait about 1 week to resume exercises and stretches.  Activities as tolerated otherwise.    After informed written consent timeout was performed.  Patient was seated on exam table.  Area overlying medial proximal plantar fascia prepped with alcohol swab.  Then utilizing ultrasound guidance, patient's right plantar fascia was injected with 2:1 bupivicaine:depomedrol.  Patient tolerated procedure well without immediate complications.

## 2018-04-29 DIAGNOSIS — L821 Other seborrheic keratosis: Secondary | ICD-10-CM | POA: Diagnosis not present

## 2018-04-29 DIAGNOSIS — L820 Inflamed seborrheic keratosis: Secondary | ICD-10-CM | POA: Diagnosis not present

## 2018-04-29 DIAGNOSIS — D225 Melanocytic nevi of trunk: Secondary | ICD-10-CM | POA: Diagnosis not present

## 2018-05-02 ENCOUNTER — Encounter: Payer: Self-pay | Admitting: *Deleted

## 2018-05-05 ENCOUNTER — Ambulatory Visit (INDEPENDENT_AMBULATORY_CARE_PROVIDER_SITE_OTHER): Payer: 59 | Admitting: Diagnostic Neuroimaging

## 2018-05-05 ENCOUNTER — Encounter: Payer: Self-pay | Admitting: Diagnostic Neuroimaging

## 2018-05-05 DIAGNOSIS — R413 Other amnesia: Secondary | ICD-10-CM | POA: Diagnosis not present

## 2018-05-05 NOTE — Progress Notes (Signed)
[facility]  PATIENT: Christopher Tanner DOB: August 08, 1955  REFERRING CLINICIAN: Threasa Beards HISTORY FROM: patient  REASON FOR VISIT: new consult    HISTORICAL  CHIEF COMPLAINT:  Chief Complaint  Patient presents with  . Memory Loss    rm 7, new Pt, MMSE 27    HISTORY OF PRESENT ILLNESS:   62 year old male here for evaluation of memory loss.  Patient reports at least one year of mild gradual onset forgetfulness with names of colleagues and spelling of certain words.  One time he was giving a talk and wanted to tell a family or joke that he uses but could not remember 1 of the details.  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  He is able to maintain all of his activities of daily living.  Patient has struggled with plantar fasciitis, has had to cut down exercise, in the last year.  No major change in sleep or stress factors.   REVIEW OF SYSTEMS: Full 14 system review of systems performed and negative with exception of: Memory loss.  ALLERGIES: Allergies  Allergen Reactions  . Crestor [Rosuvastatin Calcium]     Leg aches  . Ezetimibe-Simvastatin     Stopped due to publicity about zetia  . Simvastatin     Memory Loss  . Niaspan [Niacin] Other (See Comments)    flushing    HOME MEDICATIONS: Outpatient Medications Prior to Visit  Medication Sig Dispense Refill  . aspirin 81 MG tablet Take 81 mg by mouth daily.      . Cholecalciferol (CVS VIT D 5000 HIGH-POTENCY) 5000 UNITS capsule Take 2 capsules (10,000 Units total) by mouth daily.    Marland Kitchen Co-Enzyme Q-10 10 MG CAPS Take 1 capsule (10 mg total) by mouth daily.  0  . ezetimibe (ZETIA) 10 MG tablet TAKE 1 TABLET (10 MG TOTAL) BY MOUTH DAILY. 90 tablet 3  . Multiple Vitamins-Minerals (MULTIVITAL PERFORMANCE) TABS Take 1 tablet by mouth daily.    . Omega-3 Fatty Acids (FISH OIL PO) Take by mouth daily.      Marland Kitchen pyridOXINE (VITAMIN B-6) 50 MG tablet Take  1 tablet (50 mg total) by mouth daily. 90 tablet 2  . sildenafil (REVATIO) 20 MG tablet Take 20 mg by mouth daily as needed. FOR SEXUAL ACTIVITY  12  . amitriptyline (ELAVIL) 25 MG tablet Take 1 tablet (25 mg total) by mouth at bedtime. (Patient not taking: Reported on 05/05/2018) 30 tablet 1   No facility-administered medications prior to visit.     PAST MEDICAL HISTORY: Past Medical History:  Diagnosis Date  . Anal fistula   . Hyperlipidemia   . Mitral valve prolapse    Last echocardiogram July, 2008. Moderate MVP of the posterior leaflet. Moderate mitral regurgitation. Normal left ventricular systolic function.    PAST SURGICAL HISTORY: Past Surgical History:  Procedure Laterality Date  . COLONOSCOPY    . hand broken     Left  . KNEE ARTHROSCOPY Left 2014   meniscectomy  . SHOULDER SURGERY Left 12/2016   arthroscopy, biceps tendon repair  . TREATMENT FISTULA ANAL  04/2011    FAMILY HISTORY: Family History  Problem Relation Age of Onset  . Diabetes Mother   . Fibromyalgia Mother   . Heart disease Father     SOCIAL HISTORY: Social History   Socioeconomic History  . Marital status: Married    Spouse name: Not on file  . Number of  children: 2  . Years of education: Attorney  . Highest education level: Not on file  Occupational History    Comment: patent attorney  Social Needs  . Financial resource strain: Not on file  . Food insecurity:    Worry: Not on file    Inability: Not on file  . Transportation needs:    Medical: Not on file    Non-medical: Not on file  Tobacco Use  . Smoking status: Light Tobacco Smoker    Types: Cigars  . Smokeless tobacco: Never Used  . Tobacco comment: occasional  Substance and Sexual Activity  . Alcohol use: Yes    Comment: wine with dinner  . Drug use: No  . Sexual activity: Not on file  Lifestyle  . Physical activity:    Days per week: Not on file    Minutes per session: Not on file  . Stress: Not on file    Relationships  . Social connections:    Talks on phone: Not on file    Gets together: Not on file    Attends religious service: Not on file    Active member of club or organization: Not on file    Attends meetings of clubs or organizations: Not on file    Relationship status: Not on file  . Intimate partner violence:    Fear of current or ex partner: Not on file    Emotionally abused: Not on file    Physically abused: Not on file    Forced sexual activity: Not on file  Other Topics Concern  . Not on file  Social History Narrative   Lives with wife   Caffeine- 2 a day     PHYSICAL EXAM  GENERAL EXAM/CONSTITUTIONAL: Vitals:  Vitals:   05/05/18 0826  BP: 120/72  Pulse: 72  Weight: 197 lb 12.8 oz (89.7 kg)  Height: 6\' 3"  (1.905 m)     Body mass index is 24.72 kg/m. Wt Readings from Last 3 Encounters:  05/05/18 197 lb 12.8 oz (89.7 kg)  04/16/18 200 lb (90.7 kg)  03/18/18 200 lb (90.7 kg)     Patient is in no distress; well developed, nourished and groomed; neck is supple  CARDIOVASCULAR:  Examination of carotid arteries is normal; no carotid bruits  Regular rate and rhythm, no murmurs  Examination of peripheral vascular system by observation and palpation is normal  EYES:  Ophthalmoscopic exam of optic discs and posterior segments is normal; no papilledema or hemorrhages  Visual Acuity Screening   Right eye Left eye Both eyes  Without correction: 20/20    With correction:  20/30   Comments: Wears contact in L eye for reading    MUSCULOSKELETAL:  Gait, strength, tone, movements noted in Neurologic exam below  NEUROLOGIC: MENTAL STATUS:  MMSE - [place] Exam 05/05/2018  Orientation to time 4  Orientation to time comments date 27th  Orientation to Place 4  Orientation to Place-comments [facility]  Registration 3  Attention/ Calculation 4  Attention/Calculation-comments 93, 86, 79, 73, 66  Recall 3  Language- name  2 objects 2  Language- repeat 1  Language- follow 3 step command 3  Language- read & follow direction 1  Write a sentence 0  Write a sentence-comments no verb; "A sentence on the paper please".  Copy design 1  Total score 26    awake, alert, oriented to person, place and time; except as above  recent and remote memory intact  normal attention and  concentration; except as above  language fluent, comprehension intact, naming intact  fund of knowledge appropriate  CRANIAL NERVE:   2nd - no papilledema on fundoscopic exam  2nd, 3rd, 4th, 6th - pupils equal and reactive to light, visual fields full to confrontation, extraocular muscles intact, no nystagmus  5th - facial sensation symmetric  7th - facial strength symmetric  8th - hearing intact  9th - palate elevates symmetrically, uvula midline  11th - shoulder shrug symmetric  12th - tongue protrusion midline  MOTOR:   normal bulk and tone, full strength in the BUE, BLE  SENSORY:   normal and symmetric to light touch, temperature, vibration  COORDINATION:   finger-nose-finger, fine finger movements normal  REFLEXES:   deep tendon reflexes present and symmetric  GAIT/STATION:   narrow based gait; able to walk tandem; romberg is negative     DIAGNOSTIC DATA (LABS, IMAGING, TESTING) - I reviewed patient records, labs, notes, testing and imaging myself where available.  No results found for: WBC, HGB, HCT, MCV, PLT    Component Value Date/Time   NA 136 11/22/2017 0850   K 4.8 11/22/2017 0850   CL 97 11/22/2017 0850   CO2 24 11/22/2017 0850   GLUCOSE 93 11/22/2017 0850   GLUCOSE 88 10/10/2015 0825   BUN 17 11/22/2017 0850   CREATININE 1.07 11/22/2017 0850   CREATININE 0.92 10/10/2015 0825   CALCIUM 9.2 11/22/2017 0850   PROT 6.9 11/22/2017 0850   ALBUMIN 4.5 11/22/2017 0850   AST 27 11/22/2017 0850   ALT 29 11/22/2017 0850   ALKPHOS 60 11/22/2017 0850   BILITOT 1.0 11/22/2017 0850   GFRNONAA  75 11/22/2017 0850   GFRAA 86 11/22/2017 0850   Lab Results  Component Value Date   CHOL 181 11/22/2017   HDL 66 11/22/2017   LDLCALC 95 11/22/2017   TRIG 100 11/22/2017   CHOLHDL 2.7 11/22/2017   No results found for: HGBA1C No results found for: VITAMINB12 No results found for: TSH   10/09/16 TTE - There is severe posterior mitral valve leaflet prolapse. Mitral   regurgitation is directed anteriorly and appears still in   moderate range. Left atrium is mildly dilated. LVEF is preserved.   RVSP is normal.    ASSESSMENT AND PLAN  62 y.o. year old male here with mild difficulty with remembering names and spelling of words in the last 1 year, without significant change in activities of daily living.   Ddx: MCI, autoimmune, inflam, mass, prodromal dementia, normal aging  1. Memory loss     PLAN:  - check MRI brain  - neuropsychology testing (to establish baseline) - optimize healthy habits, nutrition, exercise, sleep; minimize alcohol use  Orders Placed This Encounter  Procedures  . MR BRAIN W WO CONTRAST  . Ambulatory referral to Neuropsychology   Return in about 6 months (around 11/04/2018).    Penni Bombard, MD 64/40/3474, 2:59 AM Certified in Neurology, Neurophysiology and Neuroimaging  [facility] [address] [place], [place] [address] [phone]

## 2018-05-06 ENCOUNTER — Telehealth: Payer: Self-pay | Admitting: Diagnostic Neuroimaging

## 2018-05-06 NOTE — Telephone Encounter (Signed)
[employer] pending faxed clinical notes  °

## 2018-05-06 NOTE — Telephone Encounter (Signed)
[employer] Auth: [id number] (exp. 05/06/18 to 06/20/18) order sent to [facility] for pt to be aware of this. I gave him their number of [phone] and to call if he has not heard in the next 2-3 business days

## 2018-05-12 ENCOUNTER — Encounter: Payer: Self-pay | Admitting: Podiatry

## 2018-05-12 ENCOUNTER — Ambulatory Visit (INDEPENDENT_AMBULATORY_CARE_PROVIDER_SITE_OTHER): Payer: 59 | Admitting: Podiatry

## 2018-05-12 ENCOUNTER — Encounter

## 2018-05-12 ENCOUNTER — Ambulatory Visit (INDEPENDENT_AMBULATORY_CARE_PROVIDER_SITE_OTHER): Payer: 59

## 2018-05-12 VITALS — BP 123/75 | HR 77 | Resp 16

## 2018-05-12 DIAGNOSIS — M722 Plantar fascial fibromatosis: Secondary | ICD-10-CM | POA: Diagnosis not present

## 2018-05-12 MED ORDER — TRIAMCINOLONE ACETONIDE 10 MG/ML IJ SUSP
10.0000 mg | Freq: Once | INTRAMUSCULAR | Status: AC
  Administered 2018-05-12: 10 mg

## 2018-05-12 MED ORDER — DICLOFENAC SODIUM 75 MG PO TBEC: 75.0000 mg | DELAYED_RELEASE_TABLET | Freq: Two times a day (BID) | ORAL | 2 refills | Status: DC

## 2018-05-12 NOTE — Patient Instructions (Signed)
For instructions on how to put on your Cam Walking Boot, please visit [email or web address]   Plantar Fasciitis (Heel Spur Syndrome) with Rehab The plantar fascia is a fibrous, ligament-like, soft-tissue structure that spans the bottom of the foot. Plantar fasciitis is a condition that causes pain in the foot due to inflammation of the tissue. SYMPTOMS   Pain and tenderness on the underneath side of the foot.  Pain that worsens with standing or walking. CAUSES  Plantar fasciitis is caused by irritation and injury to the plantar fascia on the underneath side of the foot. Common mechanisms of injury include:  Direct trauma to bottom of the foot.  Damage to a small nerve that runs under the foot where the main fascia attaches to the heel bone.  Stress placed on the plantar fascia due to bone spurs. RISK INCREASES WITH:   Activities that place stress on the plantar fascia (running, jumping, pivoting, or cutting).  Poor strength and flexibility.  Improperly fitted shoes.  Tight calf muscles.  Flat feet.  Failure to warm-up properly before activity.  Obesity. PREVENTION  Warm up and stretch properly before activity.  Allow for adequate recovery between workouts.  Maintain physical fitness:  Strength, flexibility, and endurance.  Cardiovascular fitness.  Maintain a health body weight.  Avoid stress on the plantar fascia.  Wear properly fitted shoes, including arch supports for individuals who have flat feet.  PROGNOSIS  If treated properly, then the symptoms of plantar fasciitis usually resolve without surgery. However, occasionally surgery is necessary.  RELATED COMPLICATIONS   Recurrent symptoms that may result in a chronic condition.  Problems of the lower back that are caused by compensating for the injury, such as limping.  Pain or weakness of the foot during push-off following surgery.  Chronic inflammation, scarring, and partial or complete fascia  tear, occurring more often from repeated injections.  TREATMENT  Treatment initially involves the use of ice and medication to help reduce pain and inflammation. The use of strengthening and stretching exercises may help reduce pain with activity, especially stretches of the Achilles tendon. These exercises may be performed at home or with a therapist. Your caregiver may recommend that you use heel cups of arch supports to help reduce stress on the plantar fascia. Occasionally, corticosteroid injections are given to reduce inflammation. If symptoms persist for greater than 6 months despite non-surgical (conservative), then surgery may be recommended.   MEDICATION   If pain medication is necessary, then nonsteroidal anti-inflammatory medications, such as aspirin and ibuprofen, or other minor pain relievers, such as acetaminophen, are often recommended.  Do not take pain medication within 7 days before surgery.  Prescription pain relievers may be given if deemed necessary by your caregiver. Use only as directed and only as much as you need.  Corticosteroid injections may be given by your caregiver. These injections should be reserved for the most serious cases, because they may only be given a certain number of times.  HEAT AND COLD  Cold treatment (icing) relieves pain and reduces inflammation. Cold treatment should be applied for 10 to 15 minutes every 2 to 3 hours for inflammation and pain and immediately after any activity that aggravates your symptoms. Use ice packs or massage the area with a piece of ice (ice massage).  Heat treatment may be used prior to performing the stretching and strengthening activities prescribed by your caregiver, physical therapist, or athletic trainer. Use a heat pack or soak the injury in warm water.  SEEK IMMEDIATE MEDICAL   CARE IF:  Treatment seems to offer no benefit, or the condition worsens.  Any medications produce adverse side  effects.  EXERCISES- RANGE OF MOTION (ROM) AND STRETCHING EXERCISES - Plantar Fasciitis (Heel Spur Syndrome) These exercises may help you when beginning to rehabilitate your injury. Your symptoms may resolve with or without further involvement from your physician, physical therapist or athletic trainer. While completing these exercises, remember:   Restoring tissue flexibility helps normal motion to return to the joints. This allows healthier, less painful movement and activity.  An effective stretch should be held for at least 30 seconds.  A stretch should never be painful. You should only feel a gentle lengthening or release in the stretched tissue.  RANGE OF MOTION - Toe Extension, Flexion  Sit with your right / left leg crossed over your opposite knee.  Grasp your toes and gently pull them back toward the top of your foot. You should feel a stretch on the bottom of your toes and/or foot.  Hold this stretch for 10 seconds.  Now, gently pull your toes toward the bottom of your foot. You should feel a stretch on the top of your toes and or foot.  Hold this stretch for 10 seconds. Repeat  times. Complete this stretch 3 times per day.   RANGE OF MOTION - Ankle Dorsiflexion, Active Assisted  Remove shoes and sit on a chair that is preferably not on a carpeted surface.  Place right / left foot under knee. Extend your opposite leg for support.  Keeping your heel down, slide your right / left foot back toward the chair until you feel a stretch at your ankle or calf. If you do not feel a stretch, slide your bottom forward to the edge of the chair, while still keeping your heel down.  Hold this stretch for 10 seconds. Repeat 3 times. Complete this stretch 2 times per day.   STRETCH  Gastroc, Standing  Place hands on wall.  Extend right / left leg, keeping the front knee somewhat bent.  Slightly point your toes inward on your back foot.  Keeping your right / left heel on the floor  and your knee straight, shift your weight toward the wall, not allowing your back to arch.  You should feel a gentle stretch in the right / left calf. Hold this position for 10 seconds. Repeat 3 times. Complete this stretch 2 times per day.  STRETCH  Soleus, Standing  Place hands on wall.  Extend right / left leg, keeping the other knee somewhat bent.  Slightly point your toes inward on your back foot.  Keep your right / left heel on the floor, bend your back knee, and slightly shift your weight over the back leg so that you feel a gentle stretch deep in your back calf.  Hold this position for 10 seconds. Repeat 3 times. Complete this stretch 2 times per day.  STRETCH  Gastrocsoleus, Standing  Note: This exercise can place a lot of stress on your foot and ankle. Please complete this exercise only if specifically instructed by your caregiver.   Place the ball of your right / left foot on a step, keeping your other foot firmly on the same step.  Hold on to the wall or a rail for balance.  Slowly lift your other foot, allowing your body weight to press your heel down over the edge of the step.  You should feel a stretch in your right / left calf.  Hold this   position for 10 seconds.  Repeat this exercise with a slight bend in your right / left knee. Repeat 3 times. Complete this stretch 2 times per day.   STRENGTHENING EXERCISES - Plantar Fasciitis (Heel Spur Syndrome)  These exercises may help you when beginning to rehabilitate your injury. They may resolve your symptoms with or without further involvement from your physician, physical therapist or athletic trainer. While completing these exercises, remember:   Muscles can gain both the endurance and the strength needed for everyday activities through controlled exercises.  Complete these exercises as instructed by your physician, physical therapist or athletic trainer. Progress the resistance and repetitions only as  guided.  STRENGTH - Towel Curls  Sit in a chair positioned on a non-carpeted surface.  Place your foot on a towel, keeping your heel on the floor.  Pull the towel toward your heel by only curling your toes. Keep your heel on the floor. Repeat 3 times. Complete this exercise 2 times per day.  STRENGTH - Ankle Inversion  Secure one end of a rubber exercise band/tubing to a fixed object (table, pole). Loop the other end around your foot just before your toes.  Place your fists between your knees. This will focus your strengthening at your ankle.  Slowly, pull your big toe up and in, making sure the band/tubing is positioned to resist the entire motion.  Hold this position for 10 seconds.  Have your muscles resist the band/tubing as it slowly pulls your foot back to the starting position. Repeat 3 times. Complete this exercises 2 times per day.  Document Released: 06/25/2005 Document Revised: 09/17/2011 Document Reviewed: 10/07/2008 [employer] Patient Information 2014 [employer].  

## 2018-05-12 NOTE — Progress Notes (Signed)
Subjective:   Patient ID: Christopher Tanner, male   DOB: 62 y.o.   MRN: [id number]   HPI Patient has had sharp pain in his heel since July when he went on a long hike.  He was treated with a soft orthotic has had one injection with some reduction of discomfort and has had MRI which seemed indicate that there may be some possible tearing of the plantar fascia with thickness of the medial lateral band of the plantar fascia.  Patient would like to be active and is due to go to [place] next year and is concerned about the trip   Review of Systems  All other systems reviewed and are negative.       Objective:  Physical Exam  Constitutional: He appears well-developed and well-nourished.  Cardiovascular: Intact distal pulses.  Pulmonary/Chest: Effort normal.  Musculoskeletal: Normal range of motion.  Neurological: He is alert.  Skin: Skin is warm.  Nursing note and vitals reviewed.   Neurovascular status intact muscle strength is adequate range of motion within normal limits with patient found to have discomfort in the plantar heel right in the center and center lateral aspect of the plantar fascia with the medial side seem to be improved somewhat at this current time.  Patient has moderate depression of the arch noted with no equinus condition noted at the current time     Assessment:  Probability that this is a plantar fascial inflammation cannot rule out any kind of nerve condition either local or to the back and cannot rule out other pathology but most likely appears to be plantar fascial inflammation     Plan:  H&P and spent a great deal time going over this with him.  I do think that we need to immobilize in order to try to allow it to rest and that a newer type orthotic with heel lift built in to give him support along with a punch out for the plantar fascia will be of benefit.  I am sending him to ped orthotist for evaluation and orthotic casting and today I applied an air fracture  walker right after injection from the lateral side 3 mg Kenalog 5 mg Xylocaine.  Also placed him on diclofenac 75 mg twice daily gave him instructions for ice therapy and reappoint in the next several weeks and we will see response and then dispensed his orthotics  X-ray indicates small plantar spur with multiple signs of depression of the arch right

## 2018-05-13 ENCOUNTER — Encounter: Payer: Self-pay | Admitting: Psychology

## 2018-05-18 ENCOUNTER — Ambulatory Visit
Admission: RE | Admit: 2018-05-18 | Discharge: 2018-05-18 | Disposition: A | Discharge status: Home / Self Care | Payer: 59 | Source: Ambulatory Visit | Attending: Diagnostic Neuroimaging | Admitting: Diagnostic Neuroimaging

## 2018-05-18 DIAGNOSIS — R413 Other amnesia: Secondary | ICD-10-CM

## 2018-05-18 MED ORDER — GADOBENATE DIMEGLUMINE 529 MG/ML IV SOLN
18.0000 mL | Freq: Once | INTRAVENOUS | Status: AC | PRN
  Administered 2018-05-18: 18 mL via INTRAVENOUS

## 2018-05-19 ENCOUNTER — Ambulatory Visit (INDEPENDENT_AMBULATORY_CARE_PROVIDER_SITE_OTHER): Payer: 59 | Admitting: Orthotics

## 2018-05-19 DIAGNOSIS — M722 Plantar fascial fibromatosis: Secondary | ICD-10-CM

## 2018-05-19 NOTE — Progress Notes (Signed)

## 2018-05-20 ENCOUNTER — Telehealth: Payer: Self-pay | Admitting: *Deleted

## 2018-05-20 NOTE — Telephone Encounter (Signed)
Spoke to pt and relayed that the MRI brain results per Dr. Leta Baptist were unremarkable.  Pt verbalized understanding.

## 2018-05-20 NOTE — Telephone Encounter (Signed)
-----   Message from Penni Bombard, MD sent at 05/20/2018 10:34 AM EST ----- Unremarkable imaging results. Please call patient. Continue current plan. -VRP

## 2018-06-03 ENCOUNTER — Telehealth: Payer: Self-pay | Admitting: *Deleted

## 2018-06-03 ENCOUNTER — Ambulatory Visit: Payer: 59 | Admitting: Orthotics

## 2018-06-03 DIAGNOSIS — M722 Plantar fascial fibromatosis: Secondary | ICD-10-CM

## 2018-06-03 NOTE — Progress Notes (Signed)
Patient came in today to p/up functional foot orthotics.   The orthotics were assessed to both fit and function.  The F/O addressed the biomechanical issues/pathologies as intended, offering good longitudinal arch support, proper offloading, and foot support. There weren't any signs of discomfort or irritation.  The F/O fit properly in footwear with minimal trimming/adjustments. 

## 2018-06-03 NOTE — Telephone Encounter (Signed)
Pt presents for an appt with Velora Heckler, Pedorthist, and stated Dr. Paulla Dolly said he would order Gabapentin if he continued to have pain. I reviewed pt's clinicals and did not see this noted or orders for Gabapentin. I told R. Puckett, I would send request to Dr. Paulla Dolly.

## 2018-06-04 MED ORDER — GABAPENTIN 300 MG PO CAPS: 300.0000 mg | ORAL_CAPSULE | Freq: Three times a day (TID) | ORAL | 3 refills | Status: DC

## 2018-06-04 NOTE — Telephone Encounter (Signed)
Left message informing pt Dr. Paulla Dolly had ordered the Gabapentin they had discuss at the last office visit and he could pick it up at the [facility] after 5:00pm.

## 2018-06-04 NOTE — Telephone Encounter (Signed)
300 mg tid

## 2018-06-09 ENCOUNTER — Encounter: Payer: 59 | Admitting: Orthotics

## 2018-07-21 ENCOUNTER — Encounter: Payer: Self-pay | Admitting: Podiatry

## 2018-07-21 ENCOUNTER — Ambulatory Visit (INDEPENDENT_AMBULATORY_CARE_PROVIDER_SITE_OTHER): Payer: 59 | Admitting: Podiatry

## 2018-07-21 DIAGNOSIS — M722 Plantar fascial fibromatosis: Secondary | ICD-10-CM

## 2018-07-28 NOTE — Progress Notes (Signed)
Subjective:   Patient ID: Christopher Tanner, male   DOB: 63 y.o.   MRN: [id number]   HPI Patient states he is improving but he still has burning at nighttime and still has symptoms if he is too active during the day   ROS      Objective:  Physical Exam  Neurovascular status was found to be intact with patient having significant diminishment of discomfort plantar heel but does have a very elongated foot and does have a lot of stress that occurs on his heel and continues to get burning type sensations at night which he states gabapentin seems to control     Assessment:  Probability for acute plantar fasciitis right under reasonably good control and improvement but still has symptoms and may have some neuropathic-like symptoms     Plan:  Reviewed condition and at this point dispensed night splint with all instructions on usage and he will sleep in it for the next couple weeks and then use in the evening along with heat and ice therapy.  Patient will be checked back and ultimately we may be able to get him off the gabapentin but we will make this decision based on response

## 2018-08-05 ENCOUNTER — Encounter: Payer: 59 | Admitting: Psychology

## 2018-09-17 ENCOUNTER — Ambulatory Visit (INDEPENDENT_AMBULATORY_CARE_PROVIDER_SITE_OTHER): Payer: 59 | Admitting: Podiatry

## 2018-09-17 ENCOUNTER — Telehealth: Payer: Self-pay | Admitting: Podiatry

## 2018-09-17 ENCOUNTER — Other Ambulatory Visit: Payer: Self-pay

## 2018-09-17 DIAGNOSIS — M722 Plantar fascial fibromatosis: Secondary | ICD-10-CM

## 2018-09-17 DIAGNOSIS — G629 Polyneuropathy, unspecified: Secondary | ICD-10-CM

## 2018-09-17 MED ORDER — GABAPENTIN 100 MG PO CAPS: 100.0000 mg | ORAL_CAPSULE | Freq: Three times a day (TID) | ORAL | 3 refills | Status: DC

## 2018-09-17 NOTE — Telephone Encounter (Signed)
Per Dr Paulla Dolly pt would like a second pair of orthotics ordered.Full length 54mm spenco top cover.

## 2018-09-17 NOTE — Progress Notes (Signed)
Subjective:   Patient ID: Christopher Tanner, male   DOB: 63 y.o.   MRN: [id number]   HPI Patient states his heel really seems to be improved and he states the nerve pain seems improved with the medication we put him on   ROS      Objective:  Physical Exam  Neurovascular status intact with patient's right heel improved with minimal discomfort upon palpation with orthotics which have been very helpful for him     Assessment:  Acute plantar fasciitis right that is improved but still is sore if deep palpation is done and he does do a lot of activity he may notice this a little bit     Plan:  At this point I discussed fasciitis and also his neuropathy and regular switch him down to the 100 mg gabapentin.  We are can make him a second pair of orthotics and I advised on the importance of continued diligence with this with shoe gear modifications support therapy and continued anti-inflammatory usage.  Reappoint for new orthotics or earlier if any symptoms were to occur

## 2018-10-06 ENCOUNTER — Other Ambulatory Visit: Payer: Self-pay

## 2018-10-06 ENCOUNTER — Ambulatory Visit: Payer: 59 | Admitting: Orthotics

## 2018-10-06 VITALS — Temp 96.7°F

## 2018-10-06 DIAGNOSIS — M722 Plantar fascial fibromatosis: Secondary | ICD-10-CM

## 2018-10-06 NOTE — Progress Notes (Signed)
Patient came in today to pick up custom made foot orthotics.  The goals were accomplished and the patient reported no dissatisfaction with said orthotics.  Patient was advised of breakin period and how to report any issues. 

## 2018-10-14 ENCOUNTER — Telehealth: Payer: Self-pay | Admitting: Podiatry

## 2018-10-14 NOTE — Telephone Encounter (Signed)
Pt left message yesterday asking if refurbished orthotics in..   Returned call and left message they are not in yet and I would call when they come in.Marland Kitchen

## 2018-10-22 ENCOUNTER — Telehealth: Payer: Self-pay | Admitting: *Deleted

## 2018-10-22 NOTE — Telephone Encounter (Signed)
LVM advising Due to current COVID 19 pandemic, our office is severely reducing in person visits in order to minimize the risk to our patients and healthcare providers. We recommend to convert your appointment to a video visit.  Requested he call back to discuss.

## 2018-11-04 ENCOUNTER — Ambulatory Visit: Payer: 59 | Admitting: Diagnostic Neuroimaging

## 2018-11-21 ENCOUNTER — Telehealth (HOSPITAL_COMMUNITY): Payer: Self-pay | Admitting: Radiology

## 2018-11-21 NOTE — Telephone Encounter (Signed)
Left a message with echocardiogram instructions. Unable to perform COVID 19 pre-screening.

## 2018-11-24 ENCOUNTER — Telehealth (HOSPITAL_COMMUNITY): Payer: Self-pay

## 2018-11-24 ENCOUNTER — Telehealth: Payer: Self-pay | Admitting: Cardiovascular Disease

## 2018-11-24 NOTE — Telephone Encounter (Signed)

## 2018-11-24 NOTE — Telephone Encounter (Signed)
LMTCB COVID prescreening for echo. 

## 2018-11-24 NOTE — Telephone Encounter (Signed)
New Message    Pt is calling and would like to schedule both his labs and Echo on Aug 18th But the orders will expire     Please call back

## 2018-11-25 ENCOUNTER — Other Ambulatory Visit (HOSPITAL_COMMUNITY): Payer: 59

## 2018-11-25 ENCOUNTER — Other Ambulatory Visit: Payer: 59

## 2018-12-02 ENCOUNTER — Ambulatory Visit: Payer: 59 | Admitting: Cardiovascular Disease

## 2018-12-13 ENCOUNTER — Other Ambulatory Visit: Payer: Self-pay | Admitting: Cardiovascular Disease

## 2018-12-26 ENCOUNTER — Other Ambulatory Visit: Payer: 59

## 2019-01-07 ENCOUNTER — Other Ambulatory Visit: Payer: Self-pay | Admitting: Nurse Practitioner

## 2019-01-07 DIAGNOSIS — I341 Nonrheumatic mitral (valve) prolapse: Secondary | ICD-10-CM

## 2019-01-20 ENCOUNTER — Other Ambulatory Visit (HOSPITAL_COMMUNITY): Payer: 59

## 2019-02-23 ENCOUNTER — Other Ambulatory Visit: Payer: Self-pay | Admitting: Nurse Practitioner

## 2019-02-23 DIAGNOSIS — E782 Mixed hyperlipidemia: Secondary | ICD-10-CM

## 2019-02-23 DIAGNOSIS — I341 Nonrheumatic mitral (valve) prolapse: Secondary | ICD-10-CM

## 2019-02-24 ENCOUNTER — Ambulatory Visit (HOSPITAL_COMMUNITY): Payer: 59 | Attending: Cardiology

## 2019-02-24 ENCOUNTER — Other Ambulatory Visit: Payer: Self-pay

## 2019-02-24 ENCOUNTER — Other Ambulatory Visit: Payer: 59

## 2019-02-24 DIAGNOSIS — E782 Mixed hyperlipidemia: Secondary | ICD-10-CM

## 2019-02-24 DIAGNOSIS — I341 Nonrheumatic mitral (valve) prolapse: Secondary | ICD-10-CM | POA: Diagnosis present

## 2019-02-24 LAB — HEPATIC FUNCTION PANEL
ALT: 29 IU/L (ref 0–44)
AST: 31 IU/L (ref 0–40)
Albumin: 4.7 g/dL (ref 3.8–4.8)
Alkaline Phosphatase: 56 IU/L (ref 39–117)
Bilirubin Total: 0.5 mg/dL (ref 0.0–1.2)
Bilirubin, Direct: 0.14 mg/dL (ref 0.00–0.40)
Total Protein: 7 g/dL (ref 6.0–8.5)

## 2019-02-24 LAB — LIPID PANEL
Chol/HDL Ratio: 3.3 ratio (ref 0.0–5.0)
Cholesterol, Total: 202 mg/dL — ABNORMAL HIGH (ref 100–199)
HDL: 61 mg/dL (ref >39)
LDL Calculated: 120 mg/dL — ABNORMAL HIGH (ref 0–99)
Triglycerides: 106 mg/dL (ref 0–149)
VLDL Cholesterol Cal: 21 mg/dL (ref 5–40)

## 2019-02-24 LAB — BASIC METABOLIC PANEL
BUN/Creatinine Ratio: 14 (ref 10–24)
BUN: 14 mg/dL (ref 8–27)
CO2: 23 mmol/L (ref 20–29)
Calcium: 9.1 mg/dL (ref 8.6–10.2)
Chloride: 101 mmol/L (ref 96–106)
Creatinine, Ser: 1.03 mg/dL (ref 0.76–1.27)
GFR calc Af Amer: 90 mL/min/{1.73_m2} (ref >59)
GFR calc non Af Amer: 77 mL/min/{1.73_m2} (ref >59)
Glucose: 94 mg/dL (ref 65–99)
Potassium: 4.7 mmol/L (ref 3.5–5.2)
Sodium: 139 mmol/L (ref 134–144)

## 2019-03-02 ENCOUNTER — Other Ambulatory Visit: Payer: Self-pay | Admitting: *Deleted

## 2019-03-03 ENCOUNTER — Other Ambulatory Visit: Payer: Self-pay

## 2019-03-03 DIAGNOSIS — Z20822 Contact with and (suspected) exposure to covid-19: Secondary | ICD-10-CM

## 2019-03-04 ENCOUNTER — Telehealth (INDEPENDENT_AMBULATORY_CARE_PROVIDER_SITE_OTHER): Payer: 59 | Admitting: Cardiovascular Disease

## 2019-03-04 ENCOUNTER — Other Ambulatory Visit: Payer: Self-pay

## 2019-03-04 VITALS — HR 78 | Wt 200.0 lb

## 2019-03-04 DIAGNOSIS — Z7189 Other specified counseling: Secondary | ICD-10-CM

## 2019-03-04 DIAGNOSIS — E782 Mixed hyperlipidemia: Secondary | ICD-10-CM | POA: Diagnosis not present

## 2019-03-04 DIAGNOSIS — I341 Nonrheumatic mitral (valve) prolapse: Secondary | ICD-10-CM | POA: Diagnosis not present

## 2019-03-04 LAB — NOVEL CORONAVIRUS, NAA: SARS-CoV-2, NAA: NOT DETECTED

## 2019-03-04 MED ORDER — ROSUVASTATIN CALCIUM 5 MG PO TABS: 5.0000 mg | ORAL_TABLET | Freq: Every day | ORAL | 3 refills | Status: DC

## 2019-03-04 NOTE — Patient Instructions (Signed)
Medication Instructions:  Your physician has recommended you make the following change in your medication:  1.) start rosuvastatin (Crestor) 5 mg once a day for cholesterol   If you need a refill on your cardiac medications before your next appointment, please call your pharmacy.   Lab work: In 3 months: lipids/liver function and basic metabolic panel  If you have labs (blood work) drawn today and your tests are completely normal, you will receive your results only by: Marland Kitchen MyChart Message (if you have MyChart) OR . A paper copy in the mail If you have any lab test that is abnormal or we need to change your treatment, we will call you to review the results.  Testing/Procedures: none  Follow-Up: At [facility], you and your health needs are our priority.  As part of our continuing mission to provide you with exceptional heart care, we have created designated Provider Care Teams.  These Care Teams include your primary Cardiologist (physician) and Advanced Practice Providers (APPs -  Physician Assistants and Nurse Practitioners) who all work together to provide you with the care you need, when you need it. You will need a follow up appointment in:  6 months.  Please call our office 2 months in advance to schedule this appointment.  You may see Dr. Acie Fredrickson or one of the following Advanced Practice Providers on your designated Care Team: Richardson Dopp, PA-C [place], [place] . Daune Perch, NP  Any Other Special Instructions Will Be Listed Below (If Applicable).

## 2019-03-04 NOTE — Progress Notes (Signed)
Virtual Visit via Video Note   This visit type was conducted due to national recommendations for restrictions regarding the COVID-19 Pandemic (e.g. social distancing) in an effort to limit this patient's exposure and mitigate transmission in our community.  Due to his co-morbid illnesses, this patient is at least at moderate risk for complications without adequate follow up.  This format is felt to be most appropriate for this patient at this time.  All issues noted in this document were discussed and addressed.  A limited physical exam was performed with this format.  Please refer to the patient's chart for his consent to telehealth for [facility].   Date:  03/04/2019   ID:  Christopher Tanner, DOB [birth year], MRN [id number]  Patient Location: Home Provider Location: Home  PCP:  Leanna Battles, MD  Cardiologist:    Electrophysiologist:  None   Problem List 1. Hyperlipidemia 2. Mitral valve prolapse - moderate - severe MR     Christopher Tanner is a 63 y.o.  gentleman with a history of hyperlipidemia and mitral valve prolapse. He's done very well from a cardiac standpoint.  He has not had any episodes of chest pain or shortness of breath. He is exercising on a regular basis.  Feb. 5, 2014: He is doing well. He has had some shoulder issues and is having to scale back his exercise.     October 05, 2014:  Christopher Tanner is a 63 y.o. male who presents for his MVP. No CP , no dyspnea.  No symptoms related to MVP,   October 18, 2015:  Doing great.   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  October 31, 2016:  Christopher Tanner is seen today for his yearly visit.  He has a hx of hyperlipidemia  Exercising some .  Spin class on occasion.   Nov 22, 2017:  Christopher Tanner is seen today for follow-up visit.  He has a history of mitral valve prolapse and hyperlipidemia.  Feeling well , no dyspnea, no dizziness,  Goes to spin class.  Hikes, walks, jogs once a week.     Evaluation Performed:  Follow-Up Visit  Chief Complaint:  MVP ,    Aug. 26, 2020    Christopher Tanner is a 63 y.o. male with mitral valve prolapse and a history of hyperlipidemia.  We are seeing him today by virtual visit for follow-up of some recent labs.  He is done very well recently.  He has remained active.  Recent labs show a total cholesterol of 202.  The HDL is 61.  The LDL is 120.  The triglyceride level is 106.  The LDL has increased slightly from last year when it was 95.  Got a peleton bike in January.  He and [name] have been riding quite a bit . Riding 3 times a week  Has been running some.  Developed Plantar fasciatis several  No cp or dyspnea.   Hikes on the weekend.   Lipids are mildly elevated.   The patient does not have symptoms concerning for COVID-19 infection (fever, chills, cough, or new shortness of breath).    Past Medical History:  Diagnosis Date  . Anal fistula   . Hyperlipidemia   . Mitral valve prolapse    Last echocardiogram July, 2008. Moderate MVP of the posterior leaflet. Moderate mitral regurgitation. Normal left ventricular systolic function.   Past Surgical History:  Procedure Laterality Date  . COLONOSCOPY    . hand broken  Left  . KNEE ARTHROSCOPY Left 2014   meniscectomy  . SHOULDER SURGERY Left 12/2016   arthroscopy, biceps tendon repair  . TREATMENT FISTULA ANAL  04/2011     No outpatient medications have been marked as taking for the 03/04/19 encounter (Appointment) with , [name], MD.     Allergies:   Crestor [rosuvastatin calcium], Ezetimibe-simvastatin, Simvastatin, and Niaspan [niacin]   Social History   Tobacco Use  . Smoking status: Light Tobacco Smoker    Types: Cigars  . Smokeless tobacco: Never Used  . Tobacco comment: occasional  Substance Use Topics  . Alcohol use: Yes    Comment: wine with dinner  . Drug use: No     Family Hx: The patient's family history includes Diabetes in his mother; Fibromyalgia  in his mother; Heart disease in his father.  ROS:   Please see the history of present illness.     All other systems reviewed and are negative.   Prior CV studies:   The following studies were reviewed today:    Labs/Other Tests and Data Reviewed:    EKG:  No ECG reviewed.  Recent Labs: 02/24/2019: ALT 29; BUN 14; Creatinine, Ser 1.03; Potassium 4.7; Sodium 139   Recent Lipid Panel Lab Results  Component Value Date/Time   CHOL 202 (H) 02/24/2019 08:12 AM   CHOL 172 09/20/2014 08:28 AM   TRIG 106 02/24/2019 08:12 AM   TRIG 107 09/20/2014 08:28 AM   HDL 61 02/24/2019 08:12 AM   HDL 58 09/20/2014 08:28 AM   CHOLHDL 3.3 02/24/2019 08:12 AM   CHOLHDL 2.1 10/10/2015 08:25 AM   LDLCALC 120 (H) 02/24/2019 08:12 AM   LDLCALC 93 09/20/2014 08:28 AM    Wt Readings from Last 3 Encounters:  05/05/18 197 lb 12.8 oz (89.7 kg)  04/16/18 200 lb (90.7 kg)  03/18/18 200 lb (90.7 kg)     Objective:    Vital Signs:  There were no vitals taken for this visit.   VITAL SIGNS:  reviewed GEN:  no acute distress EYES:  sclerae anicteric, EOMI - Extraocular Movements Intact RESPIRATORY:  normal respiratory effort, symmetric expansion CARDIOVASCULAR:  no peripheral edema SKIN:  no rash, lesions or ulcers. MUSCULOSKELETAL:  no obvious deformities. NEURO:  alert and oriented x 3, no obvious focal deficit PSYCH:  normal affect  ASSESSMENT & PLAN:    1. Mitral Valve proplase:    Christopher Tanner is feeling well.  Remains very active.  Remains asymptomatic.   Recent echo shows slight worsening of his MR.   Is now probably severe.   He has not had a TEE in the past.  I'll review the echo with partners and will discuss with Dr. Roxy Manns.    I do think he will need to have MV repair at some point but the timing of this is not yet determined. 2. Hyperlipidemia:   LDL is slighlty higher than last year.   He did not tolerate Crestor 20 mg a day .   Will see if he tolerates 5 mg a day      COVID-19 Education:  The signs and symptoms of COVID-19 were discussed with the patient and how to seek care for testing (follow up with PCP or arrange E-visit).  The importance of social distancing was discussed today.  Time:   Today, I have spent  27 minutes with the patient with telehealth technology discussing the above problems.     Medication Adjustments/Labs and Tests Ordered: Current medicines are reviewed at  length with the patient today.  Concerns regarding medicines are outlined above.   Tests Ordered: No orders of the defined types were placed in this encounter.   Medication Changes: No orders of the defined types were placed in this encounter.   Follow Up:  In Person in 6 month(s)  Signed, Mertie Moores, MD  03/04/2019 8:02 AM    [facility]

## 2019-03-12 ENCOUNTER — Other Ambulatory Visit: Payer: Self-pay | Admitting: Cardiovascular Disease

## 2019-03-25 ENCOUNTER — Encounter: Payer: Self-pay | Admitting: *Deleted

## 2019-03-30 ENCOUNTER — Other Ambulatory Visit: Payer: Self-pay

## 2019-03-30 ENCOUNTER — Encounter: Payer: Self-pay | Admitting: Diagnostic Neuroimaging

## 2019-03-30 ENCOUNTER — Ambulatory Visit (INDEPENDENT_AMBULATORY_CARE_PROVIDER_SITE_OTHER): Payer: 59 | Admitting: Diagnostic Neuroimaging

## 2019-03-30 VITALS — BP 112/60 | HR 75 | Temp 97.7°F | Ht 75.0 in | Wt 197.2 lb

## 2019-03-30 DIAGNOSIS — R200 Anesthesia of skin: Secondary | ICD-10-CM | POA: Diagnosis not present

## 2019-03-30 DIAGNOSIS — Z20822 Contact with and (suspected) exposure to covid-19: Secondary | ICD-10-CM

## 2019-03-30 NOTE — Progress Notes (Signed)
[facility]  PATIENT: Christopher Tanner DOB: 1956/02/19  REFERRING CLINICIAN: Threasa Beards HISTORY FROM: patient  REASON FOR VISIT: new consult    HISTORICAL  CHIEF COMPLAINT:  Chief Complaint  Patient presents with  . Numbness    rm 6, New Pt, "right foot numbness in sole and 4 little toes x 1 year"    HISTORY OF PRESENT ILLNESS:   63 year old male here for evaluation of right foot numbness.  December 2019 patient went on a very long hike and developed plantar fasciitis pain.  This was confirmed with ultrasound and MRI.  He was treated with decreased activity, rest, soft boot, orthotic inserts.  Symptoms have been stable over time.  He is also noticed some numbness and tingling, shooting pains, mainly in his toes 2 through 5.  Sometimes has very mild symptoms in the left foot.  Symptoms are worse in the morning and with certain activities such as running, jogging or hiking long distances.  Symptoms seem to be slightly better later in the day.  No significant low back pain or upper extremity symptoms.  Patient has been evaluated by podiatry and sports medicine clinics for this issue.  He has been tried on gabapentin without relief.  He only tried 100 to 300 mg/day of gabapentin in the past.   REVIEW OF SYSTEMS: Full 14 system review of systems performed and negative with exception of: As per HPI.  ALLERGIES: Allergies  Allergen Reactions  . Crestor [Rosuvastatin Calcium]     Leg aches  . Ezetimibe-Simvastatin     Stopped due to publicity about zetia  . Simvastatin     Memory Loss  . Niaspan [Niacin] Other (See Comments)    flushing    HOME MEDICATIONS: Outpatient Medications Prior to Visit  Medication Sig Dispense Refill  . aspirin 81 MG tablet Take 81 mg by mouth daily.      . Multiple Vitamins-Minerals (MULTIVITAL PERFORMANCE) TABS Take 1 tablet by mouth daily.    . Omega-3 Fatty Acids (FISH OIL PO) Take by mouth daily.      . rosuvastatin (CRESTOR) 5  MG tablet Take 1 tablet (5 mg total) by mouth daily. 90 tablet 3  . ezetimibe (ZETIA) 10 MG tablet TAKE 1 TABLET BY MOUTH EVERY DAY 90 tablet 3   No facility-administered medications prior to visit.     PAST MEDICAL HISTORY: Past Medical History:  Diagnosis Date  . Anal fistula   . Hyperlipidemia   . Mitral valve prolapse    Last echocardiogram July, 2008. Moderate MVP of the posterior leaflet. Moderate mitral regurgitation. Normal left ventricular systolic function.  . Numbness in feet   . Plantar fasciitis    right    PAST SURGICAL HISTORY: Past Surgical History:  Procedure Laterality Date  . COLONOSCOPY    . hand broken     Left  . KNEE ARTHROSCOPY Left 2014   meniscectomy  . SHOULDER SURGERY Left 12/2016   arthroscopy, biceps tendon repair  . TREATMENT FISTULA ANAL  04/2011    FAMILY HISTORY: Family History  Problem Relation Age of Onset  . Diabetes Mother   . Fibromyalgia Mother   . Heart disease Father     SOCIAL HISTORY: Social History   Socioeconomic History  . Marital status: Married    Spouse name: [name]  . Number of children: 2  . Years of education: Attorney  . Highest education level: Not on file  Occupational History    Comment: patent attorney  Social Needs  . Financial resource strain: Not on file  . Food insecurity    Worry: Not on file    Inability: Not on file  . Transportation needs    Medical: Not on file    Non-medical: Not on file  Tobacco Use  . Smoking status: Light Tobacco Smoker    Types: Cigars  . Smokeless tobacco: Never Used  . Tobacco comment: occasional  Substance and Sexual Activity  . Alcohol use: Not Currently    Comment: wine with dinner  . Drug use: No  . Sexual activity: Not on file  Lifestyle  . Physical activity    Days per week: Not on file    Minutes per session: Not on file  . Stress: Not on file  Relationships  . Social Herbalist on phone: Not on file    Gets together: Not on file     Attends religious service: Not on file    Active member of club or organization: Not on file    Attends meetings of clubs or organizations: Not on file    Relationship status: Not on file  . Intimate partner violence    Fear of current or ex partner: Not on file    Emotionally abused: Not on file    Physically abused: Not on file    Forced sexual activity: Not on file  Other Topics Concern  . Not on file  Social History Narrative   Lives with wife   Caffeine- 1-2 a day     PHYSICAL EXAM  GENERAL EXAM/CONSTITUTIONAL: Vitals:  Vitals:   03/30/19 1408  BP: 112/60  Pulse: 75  Temp: 97.7 F (36.5 C)  Weight: 197 lb 3.2 oz (89.4 kg)  Height: 6\' 3"  (1.905 m)     Body mass index is 24.65 kg/m. Wt Readings from Last 3 Encounters:  03/30/19 197 lb 3.2 oz (89.4 kg)  03/04/19 200 lb (90.7 kg)  05/05/18 197 lb 12.8 oz (89.7 kg)     Patient is in no distress; well developed, nourished and groomed; neck is supple  CARDIOVASCULAR:  Examination of carotid arteries is normal; no carotid bruits  Regular rate and rhythm, no murmurs  Examination of peripheral vascular system by observation and palpation is normal  EYES:  Ophthalmoscopic exam of optic discs and posterior segments is normal; no papilledema or hemorrhages  No exam data present  MUSCULOSKELETAL:  Gait, strength, tone, movements noted in Neurologic exam below  NEUROLOGIC: MENTAL STATUS:  MMSE - [place] Exam 05/05/2018  Orientation to time 4  Orientation to time comments date 27th  Orientation to Place 4  Orientation to Place-comments [facility]  Registration 3  Attention/ Calculation 4  Attention/Calculation-comments 93, 86, 79, 73, 66  Recall 3  Language- name 2 objects 2  Language- repeat 1  Language- follow 3 step command 3  Language- read & follow direction 1  Write a sentence 0  Write a sentence-comments no verb; "A sentence on the paper please".  Copy design 1   Total score 26    awake, alert, oriented to person, place and time  recent and remote memory intact  normal attention and concentration  language fluent, comprehension intact, naming intact  fund of knowledge appropriate  CRANIAL NERVE:   2nd - no papilledema on fundoscopic exam  2nd, 3rd, 4th, 6th - pupils equal and reactive to light, visual fields full to confrontation, extraocular muscles intact, no nystagmus  5th - facial  sensation symmetric  7th - facial strength symmetric  8th - hearing intact  9th - palate elevates symmetrically, uvula midline  11th - shoulder shrug symmetric  12th - tongue protrusion midline  MOTOR:   normal bulk and tone, full strength in the BUE, BLE  SENSORY:   normal and symmetric to light touch, pinprick, temperature, vibration  COORDINATION:   finger-nose-finger, fine finger movements normal  REFLEXES:   deep tendon reflexes present and symmetric  GAIT/STATION:   narrow based gait     DIAGNOSTIC DATA (LABS, IMAGING, TESTING) - I reviewed patient records, labs, notes, testing and imaging myself where available.  No results found for: WBC, HGB, HCT, MCV, PLT    Component Value Date/Time   NA 139 02/24/2019 0812   K 4.7 02/24/2019 0812   CL 101 02/24/2019 0812   CO2 23 02/24/2019 0812   GLUCOSE 94 02/24/2019 0812   GLUCOSE 88 10/10/2015 0825   BUN 14 02/24/2019 0812   CREATININE 1.03 02/24/2019 0812   CREATININE 0.92 10/10/2015 0825   CALCIUM 9.1 02/24/2019 0812   PROT 7.0 02/24/2019 0812   ALBUMIN 4.7 02/24/2019 0812   AST 31 02/24/2019 0812   ALT 29 02/24/2019 0812   ALKPHOS 56 02/24/2019 0812   BILITOT 0.5 02/24/2019 0812   GFRNONAA 77 02/24/2019 0812   GFRAA 90 02/24/2019 0812   Lab Results  Component Value Date   CHOL 202 (H) 02/24/2019   HDL 61 02/24/2019   LDLCALC 120 (H) 02/24/2019   TRIG 106 02/24/2019   CHOLHDL 3.3 02/24/2019   No results found for: HGBA1C No results found for: VITAMINB12  No results found for: TSH     ASSESSMENT AND PLAN  63 y.o. year old male here with numbness in the right foot since summer 2019.  Symptoms stable over time.  Dx:  1. Numbness in feet     PLAN:  NUMBNESS IN RIGHT FOOT (likely related to incomplete recovery of plantar fasciitis) - supportive care; follow up with podiatry - continue gabapentin for severe burning, tingling pain - neuro exam unremarkable; EMG/NCS not likely to impact or change mgmt, so would hold off for now  Return for return to PCP, pending if symptoms worsen or fail to improve.    Penni Bombard, MD [id number], [id number] PM Certified in Neurology, Neurophysiology and Neuroimaging  [facility] [address] [place], [place] [address] [phone]

## 2019-04-01 LAB — NOVEL CORONAVIRUS, NAA: SARS-CoV-2, NAA: NOT DETECTED

## 2019-04-10 ENCOUNTER — Other Ambulatory Visit: Payer: Self-pay

## 2019-04-10 DIAGNOSIS — Z20822 Contact with and (suspected) exposure to covid-19: Secondary | ICD-10-CM

## 2019-04-11 LAB — NOVEL CORONAVIRUS, NAA: SARS-CoV-2, NAA: NOT DETECTED

## 2019-05-07 ENCOUNTER — Ambulatory Visit: Payer: 59 | Admitting: Podiatry

## 2019-05-11 ENCOUNTER — Ambulatory Visit (INDEPENDENT_AMBULATORY_CARE_PROVIDER_SITE_OTHER): Payer: 59

## 2019-05-11 ENCOUNTER — Encounter: Payer: Self-pay | Admitting: Podiatry

## 2019-05-11 ENCOUNTER — Other Ambulatory Visit: Payer: Self-pay

## 2019-05-11 ENCOUNTER — Ambulatory Visit (INDEPENDENT_AMBULATORY_CARE_PROVIDER_SITE_OTHER): Payer: 59 | Admitting: Podiatry

## 2019-05-11 DIAGNOSIS — G5751 Tarsal tunnel syndrome, right lower limb: Secondary | ICD-10-CM | POA: Diagnosis not present

## 2019-05-11 DIAGNOSIS — M722 Plantar fascial fibromatosis: Secondary | ICD-10-CM

## 2019-05-11 NOTE — Progress Notes (Signed)
Subjective:   Patient ID: Christopher Tanner, male   DOB: 63 y.o.   MRN: [id number]   HPI Patient states he has been getting more burning in his foot and states that when he tries to be active is when it seems like it is occurring.  States it is worse after approximate 3 to 5 miles of walking and he develops a lot of tingling pain.  He saw a neurologist that did not do any testing   ROS      Objective:  Physical Exam  Neurovascular status intact with inflammation more of the distal fascia right with upon analysis it appears the fascia is intact despite MRI that seem to indicate possibility for tear of the plantar fascial medial band.  Negative Tinel's sign noted currently but he states symptoms do not occur till he is been active     Assessment:  Difficult to make complete determination but may be just a distal fasciitis versus the possibility of tarsal tunnel syndrome     Plan:  H&P careful distal injection administered discussed tarsal tunnel syndrome at great length and at this point I am going to have him exercise before he comes in so I can try to understand better why he may have any kind of neurological issue associated with this.  Patient will be seen back to recheck

## 2019-05-25 ENCOUNTER — Other Ambulatory Visit: Payer: Self-pay

## 2019-05-25 ENCOUNTER — Encounter: Payer: Self-pay | Admitting: Podiatry

## 2019-05-25 ENCOUNTER — Ambulatory Visit (INDEPENDENT_AMBULATORY_CARE_PROVIDER_SITE_OTHER): Payer: 59 | Admitting: Podiatry

## 2019-05-25 DIAGNOSIS — G629 Polyneuropathy, unspecified: Secondary | ICD-10-CM

## 2019-05-25 DIAGNOSIS — M722 Plantar fascial fibromatosis: Secondary | ICD-10-CM | POA: Diagnosis not present

## 2019-05-25 DIAGNOSIS — G5751 Tarsal tunnel syndrome, right lower limb: Secondary | ICD-10-CM | POA: Diagnosis not present

## 2019-05-25 MED ORDER — GABAPENTIN 100 MG PO CAPS: 100.0000 mg | ORAL_CAPSULE | Freq: Three times a day (TID) | ORAL | 3 refills | Status: DC

## 2019-05-27 NOTE — Progress Notes (Signed)
Subjective:   Patient ID: Christopher Tanner, male   DOB: 63 y.o.   MRN: [id number]   HPI Patient presents stating it seems that the pain has resolved some with the previous treatment but I still get some burning or tingling when I do a longer walk and while I can continue to walk I notice it   ROS      Objective:  Physical Exam  Neurovascular status unchanged intact with patient found to have diminishment of inflammation plantar aspect arch heel right with no positive tarsal tunnel syndrome noted despite the fact he took a 3 mile walk to get here     Assessment:  Possibility for some form of a either localized neuropathic or systemic neuropathic condition with possibility for some kind of compression of the nerve in the foot region versus the possibility for an issue with his back or other form of nerve compression versus neuropathy     Plan:  H&P reviewed condition at great length and I do not see the current time a good treatment plan for the symptoms he is experiencing.  I did discuss trying low doses of gabapentin prior to his walks to see if that would help with the tingling and if it were to get worse we will get a need to consider a more aggressive evaluation as to why he may be developing this tingling.  Hopefully symptoms will not progress and may improve with gabapentin which was written today 100 mg

## 2019-06-03 ENCOUNTER — Other Ambulatory Visit: Payer: 59 | Admitting: *Deleted

## 2019-06-03 ENCOUNTER — Other Ambulatory Visit: Payer: Self-pay

## 2019-06-03 ENCOUNTER — Encounter: Payer: Self-pay | Admitting: Nurse Practitioner

## 2019-06-03 DIAGNOSIS — E782 Mixed hyperlipidemia: Secondary | ICD-10-CM

## 2019-06-03 LAB — LIPID PANEL
Chol/HDL Ratio: 2.8 ratio (ref 0.0–5.0)
Cholesterol, Total: 172 mg/dL (ref 100–199)
HDL: 62 mg/dL (ref >39)
LDL Chol Calc (NIH): 92 mg/dL (ref 0–99)
Triglycerides: 99 mg/dL (ref 0–149)
VLDL Cholesterol Cal: 18 mg/dL (ref 5–40)

## 2019-06-03 LAB — BASIC METABOLIC PANEL
BUN/Creatinine Ratio: 19 (ref 10–24)
BUN: 21 mg/dL (ref 8–27)
CO2: 24 mmol/L (ref 20–29)
Calcium: 9.1 mg/dL (ref 8.6–10.2)
Chloride: 103 mmol/L (ref 96–106)
Creatinine, Ser: 1.13 mg/dL (ref 0.76–1.27)
GFR calc Af Amer: 80 mL/min/{1.73_m2} (ref >59)
GFR calc non Af Amer: 69 mL/min/{1.73_m2} (ref >59)
Glucose: 89 mg/dL (ref 65–99)
Potassium: 5.2 mmol/L (ref 3.5–5.2)
Sodium: 140 mmol/L (ref 134–144)

## 2019-06-03 LAB — HEPATIC FUNCTION PANEL
ALT: 26 IU/L (ref 0–44)
AST: 30 IU/L (ref 0–40)
Albumin: 4.5 g/dL (ref 3.8–4.8)
Alkaline Phosphatase: 59 IU/L (ref 39–117)
Bilirubin Total: 0.4 mg/dL (ref 0.0–1.2)
Bilirubin, Direct: 0.12 mg/dL (ref 0.00–0.40)
Total Protein: 7.1 g/dL (ref 6.0–8.5)

## 2019-07-24 ENCOUNTER — Ambulatory Visit: Payer: 59 | Attending: Internal Medicine

## 2019-07-24 DIAGNOSIS — Z20822 Contact with and (suspected) exposure to covid-19: Secondary | ICD-10-CM

## 2019-07-25 LAB — NOVEL CORONAVIRUS, NAA: SARS-CoV-2, NAA: NOT DETECTED

## 2019-07-28 ENCOUNTER — Ambulatory Visit: Payer: 59 | Attending: Internal Medicine

## 2019-07-28 DIAGNOSIS — Z20822 Contact with and (suspected) exposure to covid-19: Secondary | ICD-10-CM

## 2019-07-29 LAB — NOVEL CORONAVIRUS, NAA: SARS-CoV-2, NAA: NOT DETECTED

## 2019-08-20 ENCOUNTER — Other Ambulatory Visit: Payer: Self-pay

## 2019-08-20 ENCOUNTER — Ambulatory Visit (INDEPENDENT_AMBULATORY_CARE_PROVIDER_SITE_OTHER): Payer: 59 | Admitting: Cardiovascular Disease

## 2019-08-20 ENCOUNTER — Encounter: Payer: Self-pay | Admitting: Cardiovascular Disease

## 2019-08-20 VITALS — BP 118/80 | HR 73 | Ht 75.0 in | Wt 210.4 lb

## 2019-08-20 DIAGNOSIS — E782 Mixed hyperlipidemia: Secondary | ICD-10-CM

## 2019-08-20 DIAGNOSIS — I341 Nonrheumatic mitral (valve) prolapse: Secondary | ICD-10-CM

## 2019-08-20 NOTE — Progress Notes (Signed)
Cardiology Office Note   Date:  08/21/2019   ID:  Christopher Tanner, Christopher Tanner [birth year], MRN [id number]  PCP:  Leanna Battles, MD  Cardiologist:   Mertie Moores, MD   No chief complaint on file.  1. Hyperlipidemia 2. Mitral valve prolapse    Christopher Tanner is a 64 y.o.  gentleman with a history of hyperlipidemia and mitral valve prolapse. He's done very well from a cardiac standpoint.  He has not had any episodes of chest pain or shortness of breath. He is exercising on a regular basis.  Feb. 5, 2014: He is doing well. He has had some shoulder issues and is having to scale back his exercise.     October 05, 2014:  Christopher Tanner is a 64 y.o. male who presents for his MVP. No CP , no dyspnea.  No symptoms related to MVP,   October 18, 2015:  Doing great.   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  October 31, 2016:  Christopher Tanner is seen today for his yearly visit.  He has a hx of hyperlipidemia  Exercising some .  Spin class on occasion.   Nov 22, 2017:  Christopher Tanner is seen today for follow-up visit.  He has a history of mitral valve prolapse and hyperlipidemia.  Feeling well , no dyspnea, no dizziness,  Goes to spin class.  Hikes, walks, jogs once a week.   Feb. 11, 2021:  Doing well .   Had some leg cramps with rosuvastatin  He is now on rosuvastatin 3 days a week.   Father had his first MI at 76,  Then died at age 65.  Christopher Tanner is doing well  We discussed doing a coronary calcium score.  Past Medical History:  Diagnosis Date  . Anal fistula   . Hyperlipidemia   . Mitral valve prolapse    Last echocardiogram July, 2008. Moderate MVP of the posterior leaflet. Moderate mitral regurgitation. Normal left ventricular systolic function.  . Numbness in feet   . Plantar fasciitis    right    Past Surgical History:  Procedure Laterality Date  . COLONOSCOPY    . hand broken     Left  . KNEE ARTHROSCOPY Left 2014   meniscectomy  . SHOULDER SURGERY Left 12/2016   arthroscopy, biceps tendon repair  . TREATMENT FISTULA ANAL  04/2011     Current Outpatient Medications  Medication Sig Dispense Refill  . aspirin 81 MG tablet Take 81 mg by mouth daily.      Marland Kitchen gabapentin (NEURONTIN) 100 MG capsule Take 100 mg by mouth as needed.    . Multiple Vitamins-Minerals (MULTIVITAL PERFORMANCE) TABS Take 1 tablet by mouth daily.    . Omega-3 Fatty Acids (FISH OIL PO) Take by mouth daily.      . rosuvastatin (CRESTOR) 5 MG tablet Take 5 mg by mouth 3 (three) times a week.     No current facility-administered medications for this visit.    Allergies:   Ezetimibe-simvastatin, Simvastatin, and Niaspan [niacin]    Social History:  The patient  reports that he has been smoking cigars. He has never used smokeless tobacco. He reports previous alcohol use. He reports that he does not use drugs.   Family History:  The patient's family history includes Diabetes in his mother; Fibromyalgia in his mother; Heart disease in his father.    ROS: Noted in current history, otherwise review of systems is negative.   Physical Exam: Blood pressure 118/80,  pulse 73, height 6\' 3"  (1.905 m), weight 210 lb 6.4 oz (95.4 kg), SpO2 96 %.  GEN:  Well nourished, well developed in no acute distress HEENT: Normal NECK: No JVD; No carotid bruits LYMPHATICS: No lymphadenopathy CARDIAC: RRR, 2/6 systolic ejection murmur at the axilla. RESPIRATORY:  Clear to auscultation without rales, wheezing or rhonchi  ABDOMEN: Soft, non-tender, non-distended MUSCULOSKELETAL:  No edema; No deformity  SKIN: Warm and dry NEUROLOGIC:  Alert and oriented x 3   EKG:   August 20, 2019: Normal sinus rhythm at 73.  Incomplete right bundle branch block.   Recent Labs: 06/03/2019: ALT 26; BUN 21; Creatinine, Ser 1.13; Potassium 5.2; Sodium 140    Lipid Panel    Component Value Date/Time   CHOL 172 06/03/2019 0826   CHOL 172 09/20/2014 0828   TRIG 99 06/03/2019 0826   TRIG 107 09/20/2014 0828    HDL 62 06/03/2019 0826   HDL 58 09/20/2014 0828   CHOLHDL 2.8 06/03/2019 0826   CHOLHDL 2.1 10/10/2015 0825   VLDL 13 10/10/2015 0825   LDLCALC 92 06/03/2019 0826   LDLCALC 93 09/20/2014 0828      Wt Readings from Last 3 Encounters:  08/20/19 210 lb 6.4 oz (95.4 kg)  03/30/19 197 lb 3.2 oz (89.4 kg)  03/04/19 200 lb (90.7 kg)      Other studies Reviewed: Additional studies/ records that were reviewed today include: . Review of the above records demonstrates:    ASSESSMENT AND PLAN:  1.  Mitral valve prolapse:    He has severe prolapse but only moderate mitral regurgitation by echo last year.  We will plan on getting an echo next year.  He is having no symptoms.  He still exercising on a regular basis.  We discussed getting echocardiograms every 2 years to monitor his mitral valve prolapse.  2. Hyperlipidemia: His lipid levels look fairly well controlled.  He is tolerating rosuvastatin 5 mg 3 days a week.  He is had leg aches to higher doses of rosuvastatin in the past.  He does have a strong family history of coronary artery disease.  His father had his first myocardial infarction at age 64 and then died at age 68 of another myocardial infarction.    I would like to do a coronary calcium score to further risk  stratify Christopher Tanner.  If he has significant coronary calcium then we will need to be more aggressive with our rosuvastatin.  This will probably include a referral to the lipid clinic for consideration for the PCSK9 inhibitor.  Current medicines are reviewed at length with the patient today.  The patient does not have concerns regarding medicines.  The following changes have been made:  no change  Labs/ tests ordered today include:  Orders Placed This Encounter  Procedures  . CT CARDIAC SCORING  . EKG 12-Lead  . ECHOCARDIOGRAM COMPLETE     Disposition:   FU with me in 1 year      Mertie Moores, MD  08/21/2019 10:56 AM    [facility] [place], [place], [place]  [address] Phone: [phone]; Fax: [phone]

## 2019-08-20 NOTE — Patient Instructions (Signed)
Medication Instructions:  Your physician recommends that you continue on your current medications as directed. Please refer to the Current Medication list given to you today.  *If you need a refill on your cardiac medications before your next appointment, please call your pharmacy*  Lab Work: None Ordered If you have labs (blood work) drawn today and your tests are completely normal, you will receive your results only by: Marland Kitchen MyChart Message (if you have MyChart) OR . A paper copy in the mail If you have any lab test that is abnormal or we need to change your treatment, we will call you to review the results.   Testing/Procedures: Non-Cardiac CT scanning, (CAT scanning), for calcium scoring is a noninvasive, special x-ray that produces cross-sectional images of the body using x-rays and a computer. CT scans help physicians diagnose and treat medical conditions. CT scans provide greater clarity and reveal more details than regular x-ray exams.  Your physician has requested that you have an echocardiogram in 1 year. Echocardiography is a painless test that uses sound waves to create images of your heart. It provides your doctor with information about the size and shape of your heart and how well your heart's chambers and valves are working. This procedure takes approximately one hour. There are no restrictions for this procedure.    Follow-Up: At [facility], you and your health needs are our priority.  As part of our continuing mission to provide you with exceptional heart care, we have created designated Provider Care Teams.  These Care Teams include your primary Cardiologist (physician) and Advanced Practice Providers (APPs -  Physician Assistants and Nurse Practitioners) who all work together to provide you with the care you need, when you need it.  Your next appointment:   1 year(s) after your ECHO  The format for your next appointment:   In Person  Provider:   You may see Mertie Moores, MD or one of the following Advanced Practice Providers on your designated Care Team:    Richardson Dopp, PA-C  [place], [place]  Daune Perch, [place]

## 2019-09-07 ENCOUNTER — Ambulatory Visit: Payer: 59 | Attending: Internal Medicine

## 2019-09-07 DIAGNOSIS — Z20822 Contact with and (suspected) exposure to covid-19: Secondary | ICD-10-CM

## 2019-09-08 LAB — NOVEL CORONAVIRUS, NAA: SARS-CoV-2, NAA: NOT DETECTED

## 2019-09-10 ENCOUNTER — Ambulatory Visit (INDEPENDENT_AMBULATORY_CARE_PROVIDER_SITE_OTHER)
Admission: RE | Admit: 2019-09-10 | Discharge: 2019-09-10 | Disposition: A | Discharge status: Home / Self Care | Payer: Self-pay | Source: Ambulatory Visit | Attending: Cardiovascular Disease | Admitting: Cardiovascular Disease

## 2019-09-10 ENCOUNTER — Other Ambulatory Visit: Payer: Self-pay

## 2019-09-10 DIAGNOSIS — E782 Mixed hyperlipidemia: Secondary | ICD-10-CM

## 2019-09-10 DIAGNOSIS — I341 Nonrheumatic mitral (valve) prolapse: Secondary | ICD-10-CM

## 2019-09-11 ENCOUNTER — Ambulatory Visit: Payer: 59 | Attending: Internal Medicine

## 2019-09-11 DIAGNOSIS — Z20822 Contact with and (suspected) exposure to covid-19: Secondary | ICD-10-CM

## 2019-09-12 LAB — NOVEL CORONAVIRUS, NAA: SARS-CoV-2, NAA: NOT DETECTED

## 2020-03-13 ENCOUNTER — Other Ambulatory Visit: Payer: Self-pay | Admitting: Cardiovascular Disease

## 2020-03-16 MED ORDER — ROSUVASTATIN CALCIUM 5 MG PO TABS: 5.0000 mg | ORAL_TABLET | ORAL | 1 refills | Status: DC

## 2020-03-19 ENCOUNTER — Ambulatory Visit: Payer: 59 | Attending: Internal Medicine

## 2020-03-19 DIAGNOSIS — Z23 Encounter for immunization: Secondary | ICD-10-CM

## 2020-03-19 NOTE — Progress Notes (Signed)
   Covid-19 Vaccination Clinic  Name:  Christopher Tanner    MRN: [id number] DOB: 1956/01/12  03/19/2020  [name] was observed post Covid-19 immunization for 15 minutes without incident. He was provided with Vaccine Information Sheet and instruction to access the V-Safe system.   [name] was instructed to call 911 with any severe reactions post vaccine: Marland Kitchen Difficulty breathing  . Swelling of face and throat  . A fast heartbeat  . A bad rash all over body  . Dizziness and weakness

## 2020-04-12 ENCOUNTER — Other Ambulatory Visit: Payer: 59

## 2020-04-12 DIAGNOSIS — Z20822 Contact with and (suspected) exposure to covid-19: Secondary | ICD-10-CM

## 2020-04-13 LAB — NOVEL CORONAVIRUS, NAA: SARS-CoV-2, NAA: NOT DETECTED

## 2020-04-13 LAB — SARS-COV-2, NAA 2 DAY TAT

## 2020-05-30 ENCOUNTER — Telehealth: Payer: Self-pay | Admitting: Cardiovascular Disease

## 2020-05-30 DIAGNOSIS — E782 Mixed hyperlipidemia: Secondary | ICD-10-CM

## 2020-05-30 DIAGNOSIS — I341 Nonrheumatic mitral (valve) prolapse: Secondary | ICD-10-CM

## 2020-05-30 NOTE — Telephone Encounter (Signed)
Lets get lipids, liver enz, bmp prior to his office visit  Thanks

## 2020-05-30 NOTE — Telephone Encounter (Signed)
°  Patient would like to know if he will need any lab work done when he comes in February for his echo and one year follow up. If so, he would like to go ahead and get scheduled for the same day as his echo but there are no orders in Epic. Please advise.

## 2020-05-30 NOTE — Telephone Encounter (Signed)
Left message for patient letting him know that I have scheduled him for lab work on 2/11 same day as echocardiogram. Will forward to Dr. Acie Fredrickson for orders for lab work.

## 2020-08-09 IMAGING — MR MR HEEL *R* W/O CM
4 of 7 series · 13 of 40 positions shown · non-contrast
Comparison: None.

CLINICAL DATA: Right heel pain since January 2018.  No known injury.

EXAM:
MR OF THE RIGHT ANKLE WITHOUT CONTRAST
TECHNIQUE: Multiplanar, multisequence MR imaging of the ankle was performed. No
intravenous contrast was administered.

[Series 3: PD fat-sat · axial · right · 3.0mm · 0.25mm/px · z∈[-91,+12]mm · 4 of 33 slices shown]
[im 1/33]
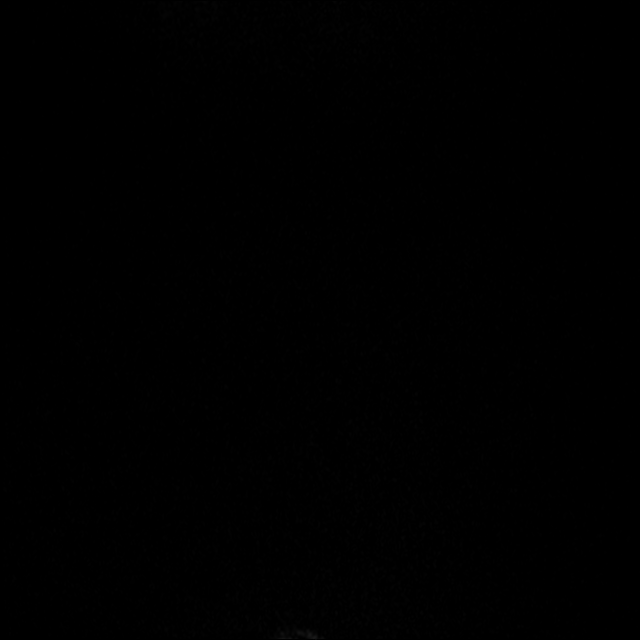
[im 6/33]
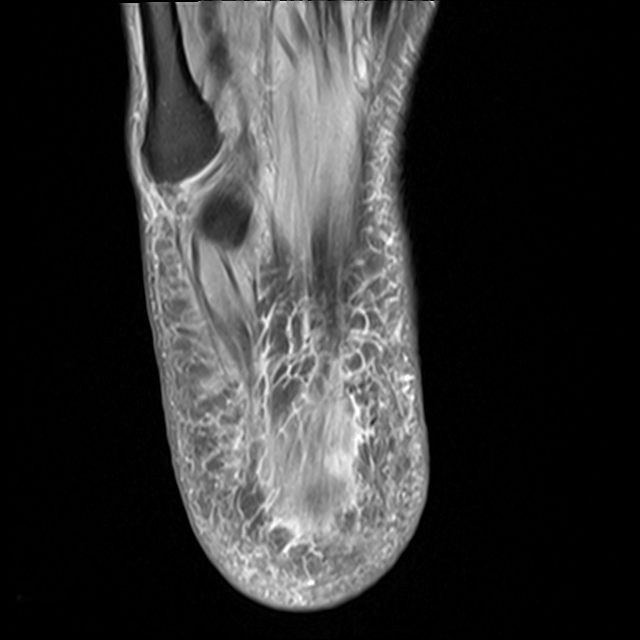
[im 17/33]
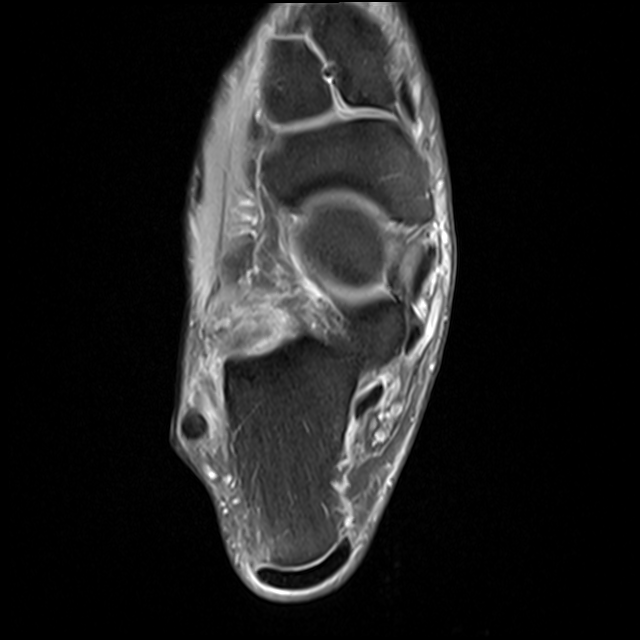
[im 27/33]
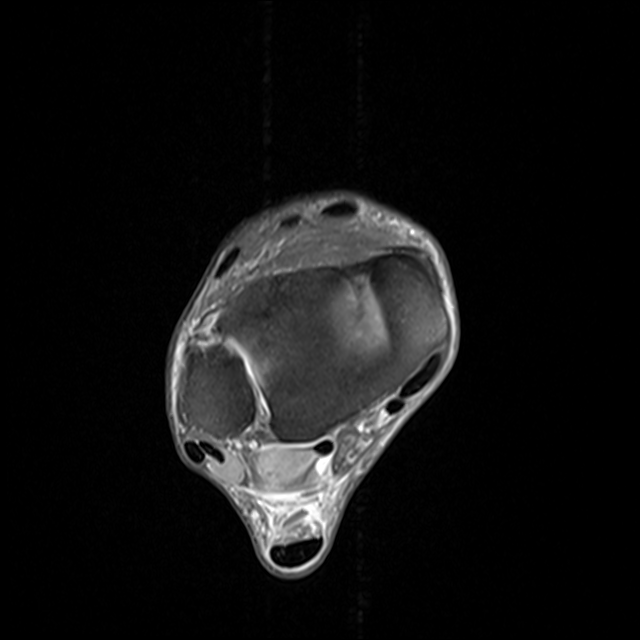

[Series 4: T2 fat-sat · axial · right · 3.0mm · 0.25mm/px · z∈[-67,+36]mm · 3 of 33 slices shown (1 of 2)]
[im 7/33]
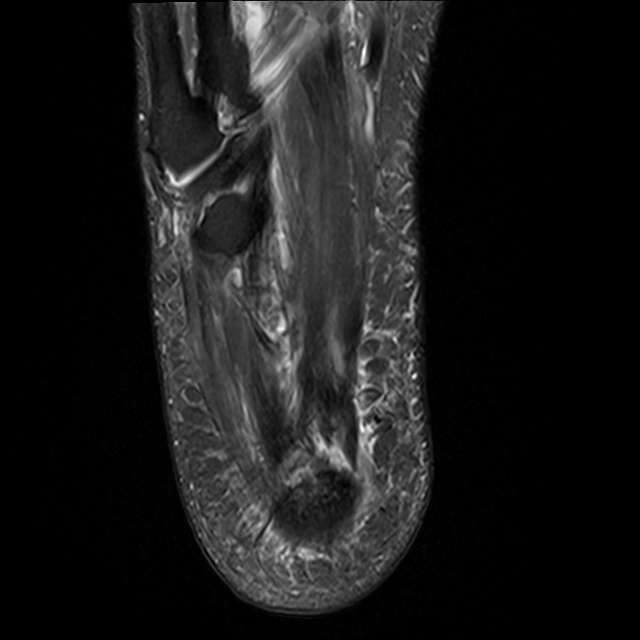
[im 20/33]
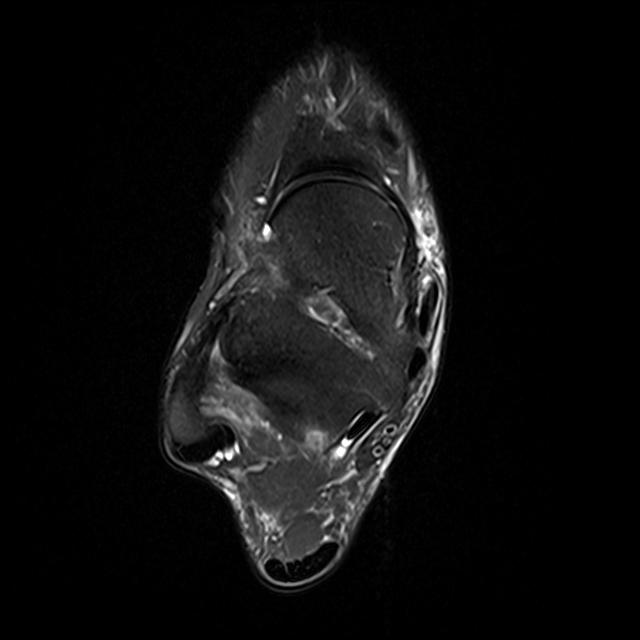
[im 33/33]
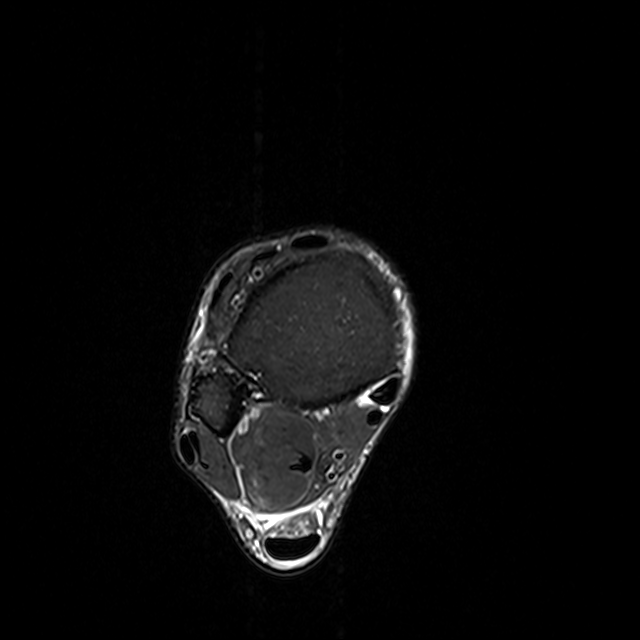

[Series 5: T1 · sagittal · right · 4.0mm · 0.27mm/px · 3 of 24 slices shown]
[im 1/24]
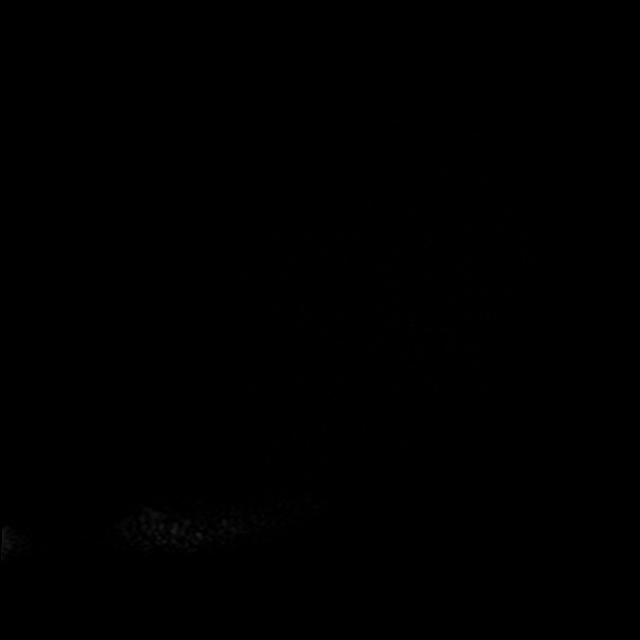
[im 12/24]
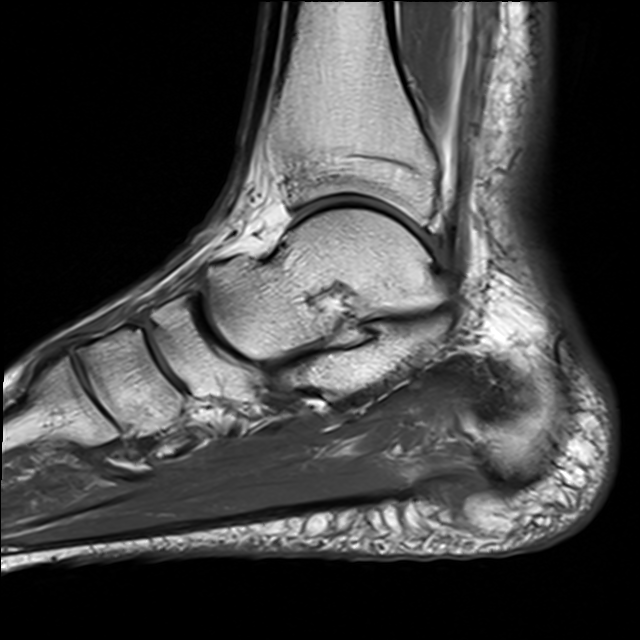
[im 24/24]
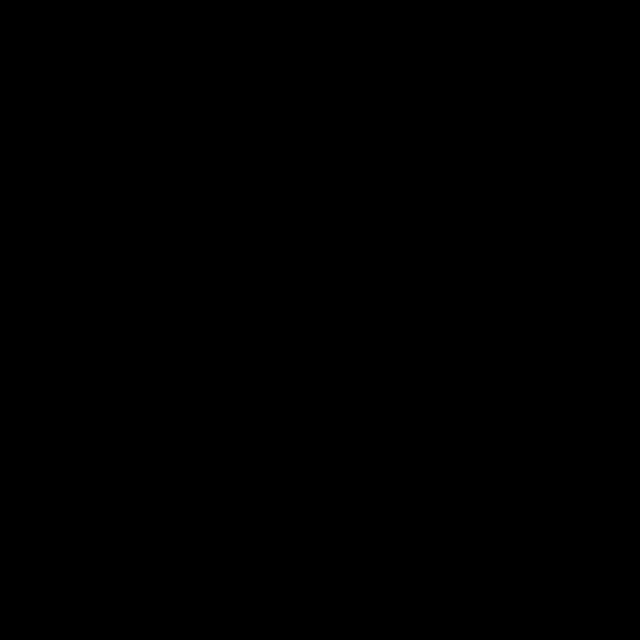

[Series 7: T2 fat-sat · coronal · right · 3.0mm · 0.25mm/px · 3 of 35 slices shown (2 of 2)]
[im 6/35]
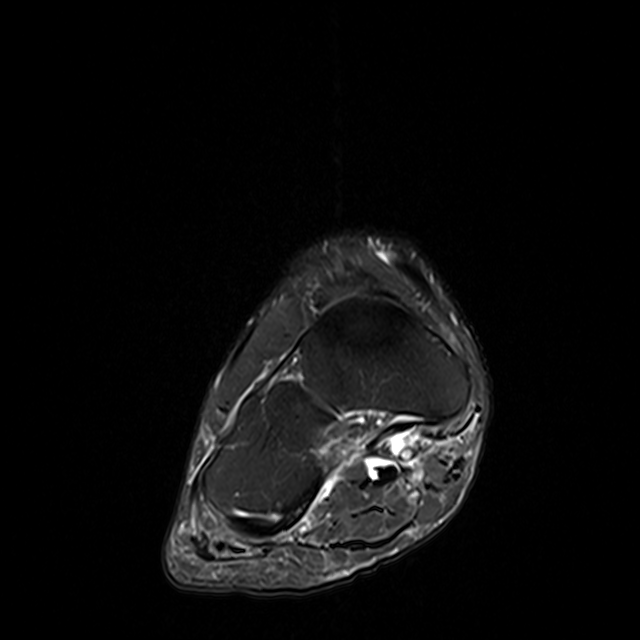
[im 18/35]
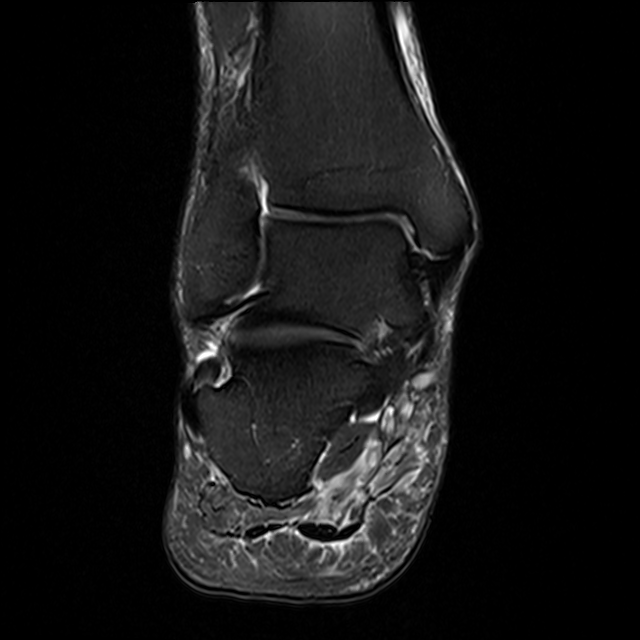
[im 29/35]
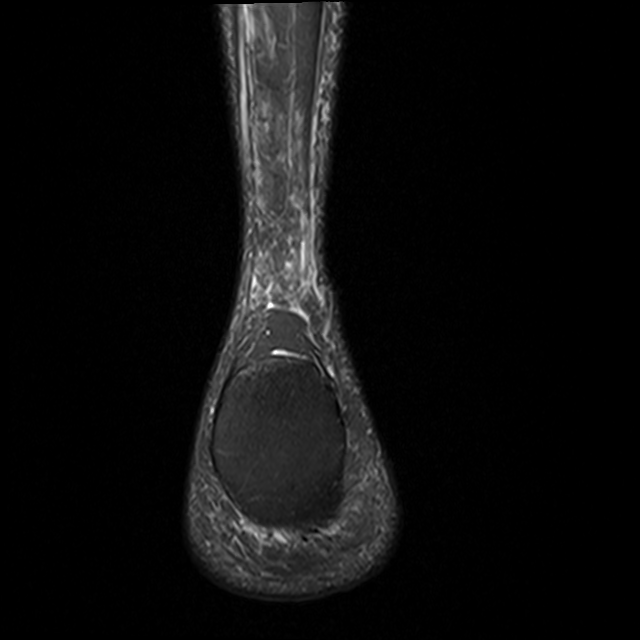

[13 of 40 positions shown; findings below may reference images not displayed]

FINDINGS: TENDONS

Peroneal: Intact.

Posteromedial: Intact. Prominent fluid is seen in the sheath of the
flexor hallucis longus tendon posterior to the distal tibia
compatible with tenosynovitis.

Anterior: Intact.

Achilles: Intact.

Plantar Fascia: Increased T2 signal in both the medial and lateral
cords is much worse in the medial cord. The medial cord is markedly
thickened. Fluid intensity signal in the medial cord at its
attachment to the calcaneus is consistent with near complete
rupture.

LIGAMENTS

Lateral: Intact.

Medial: Intact.

CARTILAGE

Ankle Joint: Negative.

Subtalar Joints/Sinus Tarsi: Negative.

Bones: Normal marrow signal throughout.

Other: None.
IMPRESSION: Plantar fasciitis is severe in the medial cord. The medial cord
appears almost completely ruptured from the calcaneus.

Fluid in the sheath of the flexor hallucis longus tendon posterior
to the distal tibia is compatible with tenosynovitis.

## 2020-08-19 ENCOUNTER — Other Ambulatory Visit (HOSPITAL_COMMUNITY): Payer: 59

## 2020-08-19 ENCOUNTER — Other Ambulatory Visit: Payer: 59 | Admitting: *Deleted

## 2020-08-19 ENCOUNTER — Other Ambulatory Visit: Payer: Self-pay

## 2020-08-19 ENCOUNTER — Encounter (HOSPITAL_COMMUNITY): Payer: Self-pay | Admitting: Radiology

## 2020-08-19 DIAGNOSIS — E782 Mixed hyperlipidemia: Secondary | ICD-10-CM

## 2020-08-19 DIAGNOSIS — I341 Nonrheumatic mitral (valve) prolapse: Secondary | ICD-10-CM

## 2020-08-19 LAB — LIPID PANEL
Chol/HDL Ratio: 2.6 ratio (ref 0.0–5.0)
Cholesterol, Total: 169 mg/dL (ref 100–199)
HDL: 65 mg/dL
LDL Chol Calc (NIH): 87 mg/dL (ref 0–99)
Triglycerides: 91 mg/dL (ref 0–149)
VLDL Cholesterol Cal: 17 mg/dL (ref 5–40)

## 2020-08-19 LAB — BASIC METABOLIC PANEL WITH GFR
BUN/Creatinine Ratio: 13 (ref 10–24)
BUN: 13 mg/dL (ref 8–27)
CO2: 24 mmol/L (ref 20–29)
Calcium: 9.3 mg/dL (ref 8.6–10.2)
Chloride: 101 mmol/L (ref 96–106)
Creatinine, Ser: 0.98 mg/dL (ref 0.76–1.27)
GFR calc Af Amer: 94 mL/min/1.73
GFR calc non Af Amer: 81 mL/min/1.73
Glucose: 96 mg/dL (ref 65–99)
Potassium: 4.2 mmol/L (ref 3.5–5.2)
Sodium: 139 mmol/L (ref 134–144)

## 2020-08-19 LAB — HEPATIC FUNCTION PANEL
ALT: 23 IU/L (ref 0–44)
AST: 25 IU/L (ref 0–40)
Albumin: 4.6 g/dL (ref 3.8–4.8)
Alkaline Phosphatase: 62 IU/L (ref 44–121)
Bilirubin Total: 0.6 mg/dL (ref 0.0–1.2)
Bilirubin, Direct: 0.16 mg/dL (ref 0.00–0.40)
Total Protein: 7.2 g/dL (ref 6.0–8.5)

## 2020-08-19 NOTE — Progress Notes (Unsigned)
Confirmed with Alecia at front desk, patient not present.

## 2020-08-22 ENCOUNTER — Other Ambulatory Visit: Payer: Self-pay

## 2020-08-22 ENCOUNTER — Encounter: Payer: Self-pay | Admitting: Cardiovascular Disease

## 2020-08-22 ENCOUNTER — Ambulatory Visit (HOSPITAL_COMMUNITY): Payer: 59 | Attending: Cardiology

## 2020-08-22 DIAGNOSIS — I341 Nonrheumatic mitral (valve) prolapse: Secondary | ICD-10-CM | POA: Diagnosis not present

## 2020-08-22 LAB — ECHOCARDIOGRAM COMPLETE
Area-P 1/2: 2.21 cm2
S' Lateral: 2.9 cm

## 2020-08-22 NOTE — Progress Notes (Signed)
Cardiology Office Note   Date:  08/23/2020   ID:  Faraz, Ponciano [birth year], MRN [id number]  PCP:  Leanna Battles, MD  Cardiologist:   Mertie Moores, MD   Chief Complaint  Patient presents with  . Mitral Valve Prolapse   1. Hyperlipidemia 2. Mitral valve prolapse    Christopher Tanner is a 65 y.o.  gentleman with a history of hyperlipidemia and mitral valve prolapse. He's done very well from a cardiac standpoint.  He has not had any episodes of chest pain or shortness of breath. He is exercising on a regular basis.  Feb. 5, 2014: He is doing well. He has had some shoulder issues and is having to scale back his exercise.     October 05, 2014:  Christopher Tanner is a 65 y.o. male who presents for his MVP. No CP , no dyspnea.  No symptoms related to MVP,   October 18, 2015:  Doing great.   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  October 31, 2016:  Christopher Tanner is seen today for his yearly visit.  He has a hx of hyperlipidemia  Exercising some .  Spin class on occasion.   Nov 22, 2017:  Christopher Tanner is seen today for follow-up visit.  He has a history of mitral valve prolapse and hyperlipidemia.  Feeling well , no dyspnea, no dizziness,  Goes to spin class.  Hikes, walks, jogs once a week.   Feb. 11, 2021:  Doing well .   Had some leg cramps with rosuvastatin  He is now on rosuvastatin 3 days a week.   Father had his first MI at 51,  Then died at age 24.  Christopher Tanner is doing well  We discussed doing a coronary calcium score.  Feb. 15, 2022:  Christopher Tanner is seen today for follow up of his MVP and HLD . Echo from Feb. 14, shows severe prolapse of his posterior leaflet of the MV with at least moderate MR Coronary calcium score was 2 agatston units - 26th for age / sex matched controls. No dyspnea. Has some numbness in his feet at night  We discussed TEE .  Is doing intermittant fasting   Past Medical History:  Diagnosis Date  . Anal fistula   . Hyperlipidemia   .  Mitral valve prolapse    Last echocardiogram July, 2008. Moderate MVP of the posterior leaflet. Moderate mitral regurgitation. Normal left ventricular systolic function.  . Numbness in feet   . Plantar fasciitis    right    Past Surgical History:  Procedure Laterality Date  . COLONOSCOPY    . hand broken     Left  . KNEE ARTHROSCOPY Left 2014   meniscectomy  . SHOULDER SURGERY Left 12/2016   arthroscopy, biceps tendon repair  . TREATMENT FISTULA ANAL  04/2011     Current Outpatient Medications  Medication Sig Dispense Refill  . aspirin 81 MG tablet Take 81 mg by mouth daily.    Marland Kitchen gabapentin (NEURONTIN) 100 MG capsule Take 100 mg by mouth as needed.    . Misc Natural Products (JOINT SUPPORT PO) Take 1 tablet by mouth 3 (three) times daily.    . Multiple Vitamins-Minerals (MULTIVITAL PERFORMANCE) TABS Take 1 tablet by mouth daily.    . Omega-3 Fatty Acids (FISH OIL PO) Take by mouth daily.    Derrill Memo ON 08/24/2020] rosuvastatin (CRESTOR) 5 MG tablet Take 1 tablet (5 mg total) by mouth 3 (three) times a  week. 40 tablet 3   No current facility-administered medications for this visit.    Allergies:   Ezetimibe-simvastatin, Simvastatin, and Niaspan [niacin]    Social History:  The patient  reports that he has been smoking cigars. He has never used smokeless tobacco. He reports previous alcohol use. He reports that he does not use drugs.   Family History:  The patient's family history includes Diabetes in his mother; Fibromyalgia in his mother; Heart disease in his father.    ROS: Noted in current history, otherwise review of systems is negative.    Physical Exam: Blood pressure 104/70, pulse 66, height 6\' 3"  (1.905 m), weight 193 lb 12.8 oz (87.9 kg).  GEN:  Well nourished, well developed in no acute distress HEENT: Normal NECK: No JVD; No carotid bruits LYMPHATICS: No lymphadenopathy CARDIAC: RRR , 2/6 holosystolic murmur at apex radiating to his axilla  RESPIRATORY:   Clear to auscultation without rales, wheezing or rhonchi  ABDOMEN: Soft, non-tender, non-distended MUSCULOSKELETAL:  No edema; No deformity  SKIN: Warm and dry NEUROLOGIC:  Alert and oriented x 3   EKG:   Feb. 16, 2022:   NSR at 66.  No ST or T wave changes.     Recent Labs: 08/19/2020: ALT 23; BUN 13; Creatinine, Ser 0.98; Potassium 4.2; Sodium 139    Lipid Panel    Component Value Date/Time   CHOL 169 08/19/2020 0755   CHOL 172 09/20/2014 0828   TRIG 91 08/19/2020 0755   TRIG 107 09/20/2014 0828   HDL 65 08/19/2020 0755   HDL 58 09/20/2014 0828   CHOLHDL 2.6 08/19/2020 0755   CHOLHDL 2.1 10/10/2015 0825   VLDL 13 10/10/2015 0825   LDLCALC 87 08/19/2020 0755   LDLCALC 93 09/20/2014 0828      Wt Readings from Last 3 Encounters:  08/23/20 193 lb 12.8 oz (87.9 kg)  08/20/19 210 lb 6.4 oz (95.4 kg)  03/30/19 197 lb 3.2 oz (89.4 kg)      Other studies Reviewed: Additional studies/ records that were reviewed today include: . Review of the above records demonstrates:    ASSESSMENT AND PLAN:  1.  Mitral valve prolapse:    Echo reveals severe prolapse of his posterior leaflet of his mitral valve He has at least moderate MR .   His posterior leaflet is now partially flail - appears to be new / worse compared to his echo in 2018 - slightly worse than echo in 2020.  Will get a TEE for further evaluation  He is very active - rides his [place] 5 days a week.   Hiked this past summer without any difficulty No arrhythmias    2. Hyperlipidemia: His lipid levels look fairly well controlled.  He is tolerating rosuvastatin 5 mg 3 days a week.  He is had leg aches to higher doses of rosuvastatin in the past.  He does have a strong family history of coronary artery disease.  His father had his first myocardial infarction at age 67 and then died at age 63 of another myocardial infarction.   His coronary calcium score is 2. We may consider a slightly more aggressive therapy in the  future. He is stable for now .  Current medicines are reviewed at length with the patient today.  The patient does not have concerns regarding medicines.  The following changes have been made:   Labs/ tests ordered today include:  Orders Placed This Encounter  Procedures  . Basic metabolic panel  . CBC  Disposition:       Mertie Moores, MD  08/23/2020 9:03 AM    [facility] [place], [place], [place]  [address] Phone: [phone]; Fax: [phone]

## 2020-08-22 NOTE — H&P (View-Only) (Signed)
Cardiology Office Note   Date:  08/23/2020   ID:  Christopher Tanner, Christopher Tanner [birth year], MRN [id number]  PCP:  Leanna Battles, MD  Cardiologist:   Mertie Moores, MD   Chief Complaint  Patient presents with  . Mitral Valve Prolapse   1. Hyperlipidemia 2. Mitral valve prolapse    Christopher Tanner is a 65 y.o.  gentleman with a history of hyperlipidemia and mitral valve prolapse. He's done very well from a cardiac standpoint.  He has not had any episodes of chest pain or shortness of breath. He is exercising on a regular basis.  Feb. 5, 2014: He is doing well. He has had some shoulder issues and is having to scale back his exercise.     October 05, 2014:  [name] is a 65 y.o. male who presents for his MVP. No CP , no dyspnea.  No symptoms related to MVP,   October 18, 2015:  Doing great.   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  October 31, 2016:  [name] is seen today for his yearly visit.  He has a hx of hyperlipidemia  Exercising some .  Spin class on occasion.   Nov 22, 2017:  Christopher Tanner is seen today for follow-up visit.  He has a history of mitral valve prolapse and hyperlipidemia.  Feeling well , no dyspnea, no dizziness,  Goes to spin class.  Hikes, walks, jogs once a week.   Feb. 11, 2021:  Doing well .   Had some leg cramps with rosuvastatin  He is now on rosuvastatin 3 days a week.   Father had his first MI at 31,  Then died at age 10.  Christopher Tanner is doing well  We discussed doing a coronary calcium score.  Feb. 15, 2022:  Christopher Tanner is seen today for follow up of his MVP and HLD . Echo from Feb. 14, shows severe prolapse of his posterior leaflet of the MV with at least moderate MR Coronary calcium score was 2 agatston units - 26th for age / sex matched controls. No dyspnea. Has some numbness in his feet at night  We discussed TEE .  Is doing intermittant fasting   Past Medical History:  Diagnosis Date  . Anal fistula   . Hyperlipidemia   .  Mitral valve prolapse    Last echocardiogram July, 2008. Moderate MVP of the posterior leaflet. Moderate mitral regurgitation. Normal left ventricular systolic function.  . Numbness in feet   . Plantar fasciitis    right    Past Surgical History:  Procedure Laterality Date  . COLONOSCOPY    . hand broken     Left  . KNEE ARTHROSCOPY Left 2014   meniscectomy  . SHOULDER SURGERY Left 12/2016   arthroscopy, biceps tendon repair  . TREATMENT FISTULA ANAL  04/2011     Current Outpatient Medications  Medication Sig Dispense Refill  . aspirin 81 MG tablet Take 81 mg by mouth daily.    Marland Kitchen gabapentin (NEURONTIN) 100 MG capsule Take 100 mg by mouth as needed.    . Misc Natural Products (JOINT SUPPORT PO) Take 1 tablet by mouth 3 (three) times daily.    . Multiple Vitamins-Minerals (MULTIVITAL PERFORMANCE) TABS Take 1 tablet by mouth daily.    . Omega-3 Fatty Acids (FISH OIL PO) Take by mouth daily.    Derrill Memo ON 08/24/2020] rosuvastatin (CRESTOR) 5 MG tablet Take 1 tablet (5 mg total) by mouth 3 (three) times a  week. 40 tablet 3   No current facility-administered medications for this visit.    Allergies:   Ezetimibe-simvastatin, Simvastatin, and Niaspan [niacin]    Social History:  The patient  reports that he has been smoking cigars. He has never used smokeless tobacco. He reports previous alcohol use. He reports that he does not use drugs.   Family History:  The patient's family history includes Diabetes in his mother; Fibromyalgia in his mother; Heart disease in his father.    ROS: Noted in current history, otherwise review of systems is negative.    Physical Exam: Blood pressure 104/70, pulse 66, height 6\' 3"  (1.905 m), weight 193 lb 12.8 oz (87.9 kg).  GEN:  Well nourished, well developed in no acute distress HEENT: Normal NECK: No JVD; No carotid bruits LYMPHATICS: No lymphadenopathy CARDIAC: RRR , 2/6 holosystolic murmur at apex radiating to his axilla  RESPIRATORY:   Clear to auscultation without rales, wheezing or rhonchi  ABDOMEN: Soft, non-tender, non-distended MUSCULOSKELETAL:  No edema; No deformity  SKIN: Warm and dry NEUROLOGIC:  Alert and oriented x 3   EKG:   Feb. 16, 2022:   NSR at 66.  No ST or T wave changes.     Recent Labs: 08/19/2020: ALT 23; BUN 13; Creatinine, Ser 0.98; Potassium 4.2; Sodium 139    Lipid Panel    Component Value Date/Time   CHOL 169 08/19/2020 0755   CHOL 172 09/20/2014 0828   TRIG 91 08/19/2020 0755   TRIG 107 09/20/2014 0828   HDL 65 08/19/2020 0755   HDL 58 09/20/2014 0828   CHOLHDL 2.6 08/19/2020 0755   CHOLHDL 2.1 10/10/2015 0825   VLDL 13 10/10/2015 0825   LDLCALC 87 08/19/2020 0755   LDLCALC 93 09/20/2014 0828      Wt Readings from Last 3 Encounters:  08/23/20 193 lb 12.8 oz (87.9 kg)  08/20/19 210 lb 6.4 oz (95.4 kg)  03/30/19 197 lb 3.2 oz (89.4 kg)      Other studies Reviewed: Additional studies/ records that were reviewed today include: . Review of the above records demonstrates:    ASSESSMENT AND PLAN:  1.  Mitral valve prolapse:    Echo reveals severe prolapse of his posterior leaflet of his mitral valve He has at least moderate MR .   His posterior leaflet is now partially flail - appears to be new / worse compared to his echo in 2018 - slightly worse than echo in 2020.  Will get a TEE for further evaluation  He is very active - rides his [place] 5 days a week.   Hiked this past summer without any difficulty No arrhythmias    2. Hyperlipidemia: His lipid levels look fairly well controlled.  He is tolerating rosuvastatin 5 mg 3 days a week.  He is had leg aches to higher doses of rosuvastatin in the past.  He does have a strong family history of coronary artery disease.  His father had his first myocardial infarction at age 95 and then died at age 41 of another myocardial infarction.   His coronary calcium score is 2. We may consider a slightly more aggressive therapy in the  future. He is stable for now .  Current medicines are reviewed at length with the patient today.  The patient does not have concerns regarding medicines.  The following changes have been made:   Labs/ tests ordered today include:  Orders Placed This Encounter  Procedures  . Basic metabolic panel  . CBC  Disposition:       Mertie Moores, MD  08/23/2020 9:03 AM    [facility] [place], [place], [place]  [address] Phone: [phone]; Fax: [phone]

## 2020-08-23 ENCOUNTER — Encounter: Payer: Self-pay | Admitting: Cardiovascular Disease

## 2020-08-23 ENCOUNTER — Ambulatory Visit: Payer: 59 | Admitting: Cardiovascular Disease

## 2020-08-23 ENCOUNTER — Ambulatory Visit (INDEPENDENT_AMBULATORY_CARE_PROVIDER_SITE_OTHER): Payer: 59 | Admitting: Cardiovascular Disease

## 2020-08-23 VITALS — BP 104/70 | HR 66 | Ht 75.0 in | Wt 193.8 lb

## 2020-08-23 DIAGNOSIS — I341 Nonrheumatic mitral (valve) prolapse: Secondary | ICD-10-CM | POA: Diagnosis not present

## 2020-08-23 DIAGNOSIS — E7800 Pure hypercholesterolemia, unspecified: Secondary | ICD-10-CM | POA: Diagnosis not present

## 2020-08-23 MED ORDER — ROSUVASTATIN CALCIUM 5 MG PO TABS: 5.0000 mg | ORAL_TABLET | ORAL | 3 refills | Status: DC

## 2020-08-23 NOTE — Patient Instructions (Signed)
Medication Instructions:  Your physician recommends that you continue on your current medications as directed. Please refer to the Current Medication list given to you today.  *If you need a refill on your cardiac medications before your next appointment, please call your pharmacy*   Lab Work: none If you have labs (blood work) drawn today and your tests are completely normal, you will receive your results only by: Marland Kitchen MyChart Message (if you have MyChart) OR . A paper copy in the mail If you have any lab test that is abnormal or we need to change your treatment, we will call you to review the results.   Testing/Procedures: See below   Follow-Up: At [facility], you and your health needs are our priority.  As part of our continuing mission to provide you with exceptional heart care, we have created designated Provider Care Teams.  These Care Teams include your primary Cardiologist (physician) and Advanced Practice Providers (APPs -  Physician Assistants and Nurse Practitioners) who all work together to provide you with the care you need, when you need it.  We recommend signing up for the patient portal called "MyChart".  Sign up information is provided on this After Visit Summary.  MyChart is used to connect with patients for Virtual Visits (Telemedicine).  Patients are able to view lab/test results, encounter notes, upcoming appointments, etc.  Non-urgent messages can be sent to your provider as well.   To learn more about what you can do with MyChart, go to [email or web address].    Your next appointment:   1 year(s)  The format for your next appointment:   In Person  Provider:   You may see Mertie Moores, MD or one of the following Advanced Practice Providers on your designated Care Team:    Richardson Dopp, PA-C  [place], [place]    Other Instructions You are scheduled for a TEE (Transesophageal Echocardiogram) 09/08/20 with Dr. Acie Fredrickson.  Please arrive at the [facility]  (Main Entrance A) at [facility]: [address] [place], [place] [address] at 7:00 am.  DIET: Nothing to eat or drink after midnight except a sip of water with medications (see medication instructions below)   Labs: BMET, CBC   09/06/2020  Come to the lab at [facility] between the hours of 7:30 am and 4:30 pm. You do not have to be fasting.  Due to recent COVID-19 restrictions implemented by our local and state authorities and in an effort to keep both patients and staff as safe as possible, our hospital system requires COVID-19 testing prior to certain scheduled hospital procedures.  Please go to [place]. [place], [place] [address] on 09/06/20 at 3:05pm  .  This is a drive up testing site.  You will not need to exit your vehicle.  You will not be billed at the time of testing but may receive a bill later depending on your insurance. You must agree to self-quarantine from the time of your testing until the procedure date on 09/08/2020.  This should included staying home with ONLY the people you live with.  Avoid take-out, grocery store shopping or leaving the house for any non-emergent reason.  Failure to have your COVID-19 test done on the date and time you have been scheduled will result in cancellation of your procedure.  Please call our office at [phone] if you have any questions.   You must have a responsible person to drive you home and stay in the waiting  area during your procedure. Failure to do so could result in cancellation.  Bring your insurance cards.  *Special Note: Every effort is made to have your procedure done on time. Occasionally there are emergencies that occur at the hospital that may cause delays. Please be patient if a delay does occur.

## 2020-08-23 NOTE — Addendum Note (Signed)
Addended by: Georgiann Cocker on: 08/23/2020 03:06 PM   Modules accepted: Orders

## 2020-09-01 ENCOUNTER — Encounter: Payer: 59 | Admitting: Gastroenterology

## 2020-09-06 ENCOUNTER — Other Ambulatory Visit: Payer: 59

## 2020-09-06 ENCOUNTER — Other Ambulatory Visit (HOSPITAL_COMMUNITY): Payer: 59

## 2020-09-06 ENCOUNTER — Inpatient Hospital Stay (HOSPITAL_COMMUNITY): Admission: RE | Admit: 2020-09-06 | Payer: 59 | Source: Ambulatory Visit

## 2020-09-06 NOTE — Progress Notes (Signed)
Pt not tested for covid today (09/06/20) due to pt testing + for covid on 07/18/20(Pt will bring proof day of procedure). Based on the guidelines the pt is in the 90 day window to not retest. The pt is still expected to quarantine until their procedure. Therefore, the pt can still have the scheduled procedure.

## 2020-09-08 ENCOUNTER — Ambulatory Visit (HOSPITAL_COMMUNITY): Payer: 59 | Admitting: Anesthesiology

## 2020-09-08 ENCOUNTER — Ambulatory Visit (HOSPITAL_BASED_OUTPATIENT_CLINIC_OR_DEPARTMENT_OTHER)
Admission: RE | Admit: 2020-09-08 | Discharge: 2020-09-08 | Disposition: A | Discharge status: Home / Self Care | Payer: 59 | Source: Ambulatory Visit | Attending: Cardiovascular Disease | Admitting: Cardiovascular Disease

## 2020-09-08 ENCOUNTER — Ambulatory Visit (HOSPITAL_COMMUNITY)
Admission: RE | Admit: 2020-09-08 | Discharge: 2020-09-08 | Disposition: A | Discharge status: Home / Self Care | Payer: 59 | Attending: Cardiovascular Disease | Admitting: Cardiovascular Disease

## 2020-09-08 ENCOUNTER — Encounter (HOSPITAL_COMMUNITY): Payer: Self-pay | Admitting: Cardiovascular Disease

## 2020-09-08 ENCOUNTER — Encounter (HOSPITAL_COMMUNITY)
Admission: RE | Disposition: A | Discharge status: Home / Self Care | Payer: Self-pay | Source: Home / Self Care | Attending: Cardiovascular Disease

## 2020-09-08 DIAGNOSIS — E785 Hyperlipidemia, unspecified: Secondary | ICD-10-CM | POA: Diagnosis not present

## 2020-09-08 DIAGNOSIS — Q211 Atrial septal defect: Secondary | ICD-10-CM

## 2020-09-08 DIAGNOSIS — F17290 Nicotine dependence, other tobacco product, uncomplicated: Secondary | ICD-10-CM | POA: Insufficient documentation

## 2020-09-08 DIAGNOSIS — Z888 Allergy status to other drugs, medicaments and biological substances status: Secondary | ICD-10-CM | POA: Insufficient documentation

## 2020-09-08 DIAGNOSIS — Z8249 Family history of ischemic heart disease and other diseases of the circulatory system: Secondary | ICD-10-CM | POA: Insufficient documentation

## 2020-09-08 DIAGNOSIS — I341 Nonrheumatic mitral (valve) prolapse: Secondary | ICD-10-CM

## 2020-09-08 DIAGNOSIS — Z7982 Long term (current) use of aspirin: Secondary | ICD-10-CM | POA: Insufficient documentation

## 2020-09-08 DIAGNOSIS — I340 Nonrheumatic mitral (valve) insufficiency: Secondary | ICD-10-CM

## 2020-09-08 HISTORY — PX: TEE WITHOUT CARDIOVERSION: SHX5443

## 2020-09-08 HISTORY — PX: BUBBLE STUDY: SHX6837

## 2020-09-08 LAB — ECHO TEE: Radius: 1.1 cm

## 2020-09-08 SURGERY — ECHOCARDIOGRAM, TRANSESOPHAGEAL
Anesthesia: Monitor Anesthesia Care

## 2020-09-08 MED ORDER — SODIUM CHLORIDE 0.9 % IV SOLN: INTRAVENOUS | Status: DC

## 2020-09-08 MED ORDER — DEXMEDETOMIDINE (PRECEDEX) IN NS 20 MCG/5ML (4 MCG/ML) IV SYRINGE
PREFILLED_SYRINGE | INTRAVENOUS | Status: DC | PRN
  Administered 2020-09-08: 8 ug via INTRAVENOUS
  Administered 2020-09-08: 4 ug via INTRAVENOUS
  Administered 2020-09-08: 8 ug via INTRAVENOUS

## 2020-09-08 MED ORDER — PROPOFOL 500 MG/50ML IV EMUL
INTRAVENOUS | Status: DC | PRN
  Administered 2020-09-08: 150 ug/kg/min via INTRAVENOUS

## 2020-09-08 NOTE — Transfer of Care (Signed)
Immediate Anesthesia Transfer of Care Note  Patient: Christopher Tanner  Procedure(s) Performed: TRANSESOPHAGEAL ECHOCARDIOGRAM (TEE) (N/A ) BUBBLE STUDY  Patient Location: Endoscopy Unit  Anesthesia Type:MAC  Level of Consciousness: awake  Airway & Oxygen Therapy: Patient Spontanous Breathing and Patient connected to nasal cannula oxygen  Post-op Assessment: Report given to RN and Post -op Vital signs reviewed and stable  Post vital signs: Reviewed and stable  Last Vitals:  Vitals Value Taken Time  BP    Temp    Pulse    Resp    SpO2      Last Pain:  Vitals:   09/08/20 0754  TempSrc: Temporal  PainSc: 0-No pain         Complications: No complications documented.

## 2020-09-08 NOTE — Anesthesia Preprocedure Evaluation (Addendum)
Anesthesia Evaluation  Patient identified by MRN, date of birth, ID band Patient awake    Reviewed: Allergy & Precautions, NPO status , Patient's Chart, lab work & pertinent test results  Airway Mallampati: II  TM Distance: >3 FB Neck ROM: Full    Dental  (+) Teeth Intact   Pulmonary neg pulmonary ROS, Current Smoker,    Pulmonary exam normal        Cardiovascular + Valvular Problems/Murmurs MVP  Rhythm:Regular Rate:Normal     Neuro/Psych negative neurological ROS  negative psych ROS   GI/Hepatic negative GI ROS, Neg liver ROS,   Endo/Other  negative endocrine ROS  Renal/GU negative Renal ROS  negative genitourinary   Musculoskeletal Right plantar fascitis    Abdominal (+)  Abdomen: soft. Bowel sounds: normal.  Peds  Hematology negative hematology ROS (+)   Anesthesia Other Findings   Reproductive/Obstetrics                            Anesthesia Physical Anesthesia Plan  ASA: II  Anesthesia Plan: MAC   Post-op Pain Management:    Induction: Intravenous  PONV Risk Score and Plan: Propofol infusion  Airway Management Planned: Simple Face Mask, Natural Airway and Nasal Cannula  Additional Equipment: None  Intra-op Plan:   Post-operative Plan:   Informed Consent: I have reviewed the patients History and Physical, chart, labs and discussed the procedure including the risks, benefits and alternatives for the proposed anesthesia with the patient or authorized representative who has indicated his/her understanding and acceptance.     Dental advisory given  Plan Discussed with: CRNA  Anesthesia Plan Comments: (ECHO 02/22: 1. Flail posterior MV leaflet; MR likely at least moderate but may be  underestimated due to eccentric nature of jet; suggest TEE to further  assess.  2. Left ventricular ejection fraction, by estimation, is 60 to 65%. The  left ventricle has normal  function. The left ventricle has no regional  wall motion abnormalities. Left ventricular diastolic parameters are  consistent with Grade I diastolic  dysfunction (impaired relaxation).  3. Right ventricular systolic function is normal. The right ventricular  size is normal.  4. The mitral valve is myxomatous. Moderate mitral valve regurgitation.  No evidence of mitral stenosis. There is severe holosystolic prolapse of  posterior leaflet of the mitral valve.  5. The aortic valve is tricuspid. Aortic valve regurgitation is not  visualized. No aortic stenosis is present.  6. Aortic dilatation noted. There is mild dilatation of the aortic root,  measuring 42 mm.  7. The inferior vena cava is normal in size with greater than 50%  respiratory variability, suggesting right atrial pressure of 3 mmHg. )       Anesthesia Quick Evaluation

## 2020-09-08 NOTE — Interval H&P Note (Signed)
History and Physical Interval Note:  09/08/2020 8:11 AM  Christopher Tanner  has presented today for surgery, with the diagnosis of [place].  The various methods of treatment have been discussed with the patient and family. After consideration of risks, benefits and other options for treatment, the patient has consented to  Procedure(s): TRANSESOPHAGEAL ECHOCARDIOGRAM (TEE) (N/A) as a surgical intervention.  The patient's history has been reviewed, patient examined, no change in status, stable for surgery.  I have reviewed the patient's chart and labs.  Questions were answered to the patient's satisfaction.     Mertie Moores

## 2020-09-08 NOTE — Progress Notes (Signed)
  Echocardiogram Echocardiogram Transesophageal has been performed.  Christopher Tanner 09/08/2020, 9:12 AM

## 2020-09-08 NOTE — Anesthesia Postprocedure Evaluation (Signed)
Anesthesia Post Note  Patient: Christopher Tanner  Procedure(s) Performed: TRANSESOPHAGEAL ECHOCARDIOGRAM (TEE) (N/A ) BUBBLE STUDY     Patient location during evaluation: Endoscopy Anesthesia Type: MAC Level of consciousness: awake and alert Pain management: pain level controlled Vital Signs Assessment: post-procedure vital signs reviewed and stable Respiratory status: spontaneous breathing, nonlabored ventilation, respiratory function stable and patient connected to nasal cannula oxygen Cardiovascular status: stable and blood pressure returned to baseline Postop Assessment: no apparent nausea or vomiting Anesthetic complications: no   No complications documented.  Last Vitals:  Vitals:   09/08/20 0910 09/08/20 0920  BP: (!) 97/58 94/66  Pulse: (!) 58 (!) 56  Resp: 17 15  Temp:    SpO2: 96% 98%    Last Pain:  Vitals:   09/08/20 0920  TempSrc:   PainSc: 0-No pain                 March Rummage Trudie Cervantes

## 2020-09-08 NOTE — Discharge Instructions (Signed)
Transesophageal Echocardiogram Transesophageal echocardiogram (TEE) is a test that uses sound waves to take pictures of your heart. TEE is done by passing a small probe attached to a flexible tube down the part of the body that moves food from your mouth to your stomach (esophagus). The pictures give clear images of your heart. This can help your doctor see if there are problems with your heart. Tell a doctor about:  Any allergies you have.  All medicines you are taking. This includes vitamins, herbs, eye drops, creams, and over-the-counter medicines.  Any problems you or family members have had with anesthetic medicines.  Any blood disorders you have.  Any surgeries you have had.  Any medical conditions you have.  Any swallowing problems.  Whether you have or have had a blockage in the part of the body that moves food from your mouth to your stomach.  Whether you are pregnant or may be pregnant. What are the risks? In general, this is a safe procedure. But, problems may occur, such as:  Damage to nearby structures or organs.  A tear in the part of the body that moves food from your mouth to your stomach.  Irregular heartbeat.  Hoarse voice or trouble swallowing.  Bleeding. What happens before the procedure? Medicines  Ask your doctor about changing or stopping: ? Your normal medicines. ? Vitamins, herbs, and supplements. ? Over-the-counter medicines.  Do not take aspirin or ibuprofen unless you are told to. General instructions  Follow instructions from your doctor about what you cannot eat or drink.  You will take out any dentures or dental retainers.  Plan to have a responsible adult take you home from the hospital or clinic.  Plan to have a responsible adult care for you for the time you are told after you leave the hospital or clinic. This is important. What happens during the procedure?  An IV will be put into one of your veins.  You may be given: ? A  sedative. This medicine helps you relax. ? A medicine to numb the back of your throat. This may be sprayed or gargled.  Your blood pressure, heart rate, and breathing will be watched.  You may be asked to lie on your left side.  A bite block will be placed in your mouth. This keeps you from biting the tube.  The tip of the probe will be placed into the back of your mouth.  You will be asked to swallow.  Your doctor will take pictures of your heart.  The probe and bite block will be taken out after the test is done. The procedure may vary among doctors and hospitals.   What can I expect after the procedure?  You will be monitored until you leave the hospital or clinic. This includes checking your blood pressure, heart rate, breathing rate, and blood oxygen level.  Your throat may feel sore and numb. This will get better over time. You will not be allowed to eat or drink until the numbness has gone away.  It is common to have a sore throat for a day or two.  It is up to you to get the results of your procedure. Ask how to get your results when they are ready. Follow these instructions at home:  If you were given a sedative during your procedure, do not drive or use machines until your doctor says that it is safe.  Return to your normal activities when your doctor says that it is safe.    Keep all follow-up visits. Summary  TEE is a test that uses sound waves to take pictures of your heart.  You will be given a medicine to help you relax.  Do not drive or use machines until your doctor says that it is safe. This information is not intended to replace advice given to you by your health care provider. Make sure you discuss any questions you have with your health care provider. Document Revised: 02/16/2020 Document Reviewed: 02/16/2020 Elsevier Patient Education  2021 [employer].  

## 2020-09-08 NOTE — Anesthesia Procedure Notes (Signed)
Procedure Name: MAC Date/Time: 09/08/2020 8:30 AM Performed by: Lieutenant Diego, CRNA Pre-anesthesia Checklist: Patient identified, Emergency Drugs available, Suction available, Patient being monitored and Timeout performed Patient Re-evaluated:Patient Re-evaluated prior to induction Oxygen Delivery Method: Simple face mask Preoxygenation: Pre-oxygenation with 100% oxygen Induction Type: IV induction

## 2020-09-08 NOTE — CV Procedure (Signed)
    Transesophageal Echocardiogram Note  Christopher Tanner [id number] Aug 17, 1955  Procedure: Transesophageal Echocardiogram Indications: MVP   Procedure Details Consent: Obtained Time Out: Verified patient identification, verified procedure, site/side was marked, verified correct patient position, special equipment/implants available, Radiology Safety Procedures followed,  medications/allergies/relevent history reviewed, required imaging and test results available.  Performed  Medications:  During this procedure the patient is administered a propofol drip ( total of 295 mg IV )  sedation.  The patient's heart rate, blood pressure, and oxygen saturation are monitored continuously during the procedure. The period of conscious sedation is  30  minutes, of which I was present face-to-face 100% of this time.  Left Ventrical:  Normal LV function   Mitral Valve: severe prolapse of the posterior leaflet,  partialy flail .  Moderate - severe MR.   There is no reversal of flow in the PV   Aortic Valve: normal 3 leaflet valve   Tricuspid Valve: normal   Pulmonic Valve: trivial PI   Left Atrium/ Left atrial appendage: no thrombus in the LAA   Atrial septum: no ASD or PFO   Aorta: normal    Complications: No apparent complications Patient did tolerate procedure well.   Thayer Headings, Brooke Bonito., MD, [facility] 09/08/2020, 8:56 AM

## 2020-09-09 ENCOUNTER — Encounter (HOSPITAL_COMMUNITY): Payer: Self-pay | Admitting: Cardiovascular Disease

## 2020-12-27 ENCOUNTER — Encounter: Payer: Self-pay | Admitting: Gastroenterology

## 2021-02-14 ENCOUNTER — Ambulatory Visit (AMBULATORY_SURGERY_CENTER): Payer: 59

## 2021-02-14 ENCOUNTER — Other Ambulatory Visit: Payer: Self-pay

## 2021-02-14 VITALS — Ht 75.0 in | Wt 190.0 lb

## 2021-02-14 DIAGNOSIS — Z1211 Encounter for screening for malignant neoplasm of colon: Secondary | ICD-10-CM

## 2021-02-14 MED ORDER — PEG-KCL-NACL-NASULF-NA ASC-C 100 G PO SOLR: 1.0000 | Freq: Once | ORAL | 0 refills | Status: AC

## 2021-02-14 NOTE — Progress Notes (Signed)
Pre visit completed via phone call; Patient verified name, DOB, and address; No egg or soy allergy known to patient  No issues with past sedation with any surgeries or procedures Patient denies ever being told they had issues or difficulty with intubation  No FH of Malignant Hyperthermia No diet pills per patient No home 02 use per patient  No blood thinners per patient  Pt denies issues with constipation at this time; patient does report use of Metamucil and Magnesium tabs; No A fib or A flutter  EMMI video via [place] 19 guidelines implemented in PV today with Pt and RN  Pt is fully vaccinated for Covid x 2 + boosterS; Coupon given to pt in PV today, Code to Pharmacy and NO PA's for preps discussed with pt in PV today  Discussed with pt there will be an out-of-pocket cost for prep and that varies from $0 to 70 dollars  Due to the COVID-19 pandemic we are asking patients to follow certain guidelines.  Pt aware of COVID protocols and [facility] guidelines

## 2021-03-01 ENCOUNTER — Encounter: Payer: 59 | Admitting: Gastroenterology

## 2021-03-01 ENCOUNTER — Encounter: Payer: Self-pay | Admitting: Gastroenterology

## 2021-03-06 ENCOUNTER — Other Ambulatory Visit: Payer: Self-pay | Admitting: Gastroenterology

## 2021-03-06 ENCOUNTER — Encounter: Payer: Self-pay | Admitting: Gastroenterology

## 2021-03-06 ENCOUNTER — Other Ambulatory Visit: Payer: Self-pay

## 2021-03-06 ENCOUNTER — Ambulatory Visit (AMBULATORY_SURGERY_CENTER): Payer: 59 | Admitting: Gastroenterology

## 2021-03-06 VITALS — BP 110/70 | HR 56 | Temp 97.5°F | Resp 17 | Ht 75.0 in | Wt 190.0 lb

## 2021-03-06 DIAGNOSIS — D128 Benign neoplasm of rectum: Secondary | ICD-10-CM | POA: Diagnosis not present

## 2021-03-06 DIAGNOSIS — D120 Benign neoplasm of cecum: Secondary | ICD-10-CM

## 2021-03-06 DIAGNOSIS — Z1211 Encounter for screening for malignant neoplasm of colon: Secondary | ICD-10-CM | POA: Diagnosis present

## 2021-03-06 DIAGNOSIS — D124 Benign neoplasm of descending colon: Secondary | ICD-10-CM

## 2021-03-06 HISTORY — PX: COLONOSCOPY: SHX174

## 2021-03-06 MED ORDER — SODIUM CHLORIDE 0.9 % IV SOLN: 500.0000 mL | Freq: Once | INTRAVENOUS | Status: AC

## 2021-03-06 NOTE — Progress Notes (Signed)
VS by CW  I have reviewed the patient's medical history in detail and updated the computerized patient record.  

## 2021-03-06 NOTE — Patient Instructions (Signed)
Resume previous diet and continue present medications. Awaiting pathology results. Repeat colonoscopy for surveillance based on pathology results.  YOU HAD AN ENDOSCOPIC PROCEDURE TODAY AT THE [facility]:   Refer to the procedure report that was given to you for any specific questions about what was found during the examination.  If the procedure report does not answer your questions, please call your gastroenterologist to clarify.  If you requested that your care partner not be given the details of your procedure findings, then the procedure report has been included in a sealed envelope for you to review at your convenience later.  YOU SHOULD EXPECT: Some feelings of bloating in the abdomen. Passage of more gas than usual.  Walking can help get rid of the air that was put into your GI tract during the procedure and reduce the bloating. If you had a lower endoscopy (such as a colonoscopy or flexible sigmoidoscopy) you may notice spotting of blood in your stool or on the toilet paper. If you underwent a bowel prep for your procedure, you may not have a normal bowel movement for a few days.  Please Note:  You might notice some irritation and congestion in your nose or some drainage.  This is from the oxygen used during your procedure.  There is no need for concern and it should clear up in a day or so.  SYMPTOMS TO REPORT IMMEDIATELY:  Following lower endoscopy (colonoscopy or flexible sigmoidoscopy):  Excessive amounts of blood in the stool  Significant tenderness or worsening of abdominal pains  Swelling of the abdomen that is new, acute  Fever of 100F or higher  For urgent or emergent issues, a gastroenterologist can be reached at any hour by calling [phone]. Do not use MyChart messaging for urgent concerns.    DIET:  We do recommend a small meal at first, but then you may proceed to your regular diet.  Drink plenty of fluids but you should avoid alcoholic beverages for 24  hours.  ACTIVITY:  You should plan to take it easy for the rest of today and you should NOT DRIVE or use heavy machinery until tomorrow (because of the sedation medicines used during the test).    FOLLOW UP: Our staff will call the number listed on your records 48-72 hours following your procedure to check on you and address any questions or concerns that you may have regarding the information given to you following your procedure. If we do not reach you, we will leave a message.  We will attempt to reach you two times.  During this call, we will ask if you have developed any symptoms of COVID 19. If you develop any symptoms (ie: fever, flu-like symptoms, shortness of breath, cough etc.) before then, please call [phone].  If you test positive for Covid 19 in the 2 weeks post procedure, please call and report this information to us.    If any biopsies were taken you will be contacted by phone or by letter within the next 1-3 weeks.  Please call us at [phone] if you have not heard about the biopsies in 3 weeks.    SIGNATURES/CONFIDENTIALITY: You and/or your care partner have signed paperwork which will be entered into your electronic medical record.  These signatures attest to the fact that that the information above on your After Visit Summary has been reviewed and is understood.  Full responsibility of the confidentiality of this discharge information lies with you and/or your care-partner.  

## 2021-03-06 NOTE — Op Note (Signed)
[facility] Patient Name: Christopher Tanner Procedure Date: 03/06/2021 2:09 PM MRN: [id number] Endoscopist: Ladene Artist , MD Age: 65 Referring MD:  Date of Birth: March 13, 1956 Gender: Male Account #: [id number] Procedure:                Colonoscopy Indications:              Screening for colorectal malignant neoplasm Medicines:                Monitored Anesthesia Care Procedure:                Pre-Anesthesia Assessment:                           - Prior to the procedure, a History and Physical                            was performed, and patient medications and                            allergies were reviewed. The patient's tolerance of                            previous anesthesia was also reviewed. The risks                            and benefits of the procedure and the sedation                            options and risks were discussed with the patient.                            All questions were answered, and informed consent                            was obtained. Prior Anticoagulants: The patient has                            taken no previous anticoagulant or antiplatelet                            agents. ASA Grade Assessment: II - A patient with                            mild systemic disease. After reviewing the risks                            and benefits, the patient was deemed in                            satisfactory condition to undergo the procedure.                           After obtaining informed consent, the colonoscope  was passed under direct vision. Throughout the                            procedure, the patient's blood pressure, pulse, and                            oxygen saturations were monitored continuously. The                            CF [id number] was introduced through the anus                            and advanced to the the cecum, identified by                            appendiceal orifice and  ileocecal valve. The                            ileocecal valve, appendiceal orifice, and rectum                            were photographed. The quality of the bowel                            preparation was excellent. The colonoscopy was                            performed without difficulty. The patient tolerated                            the procedure well. Scope In: 2:23:31 PM Scope Out: 2:40:27 PM Scope Withdrawal Time: 0 hours 14 minutes 40 seconds  Total Procedure Duration: 0 hours 16 minutes 56 seconds  Findings:                 The perianal and digital rectal examinations were                            normal.                           Three sessile polyps were found in the mid rectum,                            descending colon and cecum. The polyps were 6 to 8                            mm in size. These polyps were removed with a cold                            snare. Resection and retrieval were complete.                           External hemorrhoids were found during  retroflexion. The hemorrhoids were small.                           The exam was otherwise without abnormality on                            direct and retroflexion views. Complications:            No immediate complications. Estimated blood loss:                            None. Estimated Blood Loss:     Estimated blood loss: none. Impression:               - Three 6 to 8 mm polyps in the mid rectum, in the                            descending colon and in the cecum, removed with a                            cold snare. Resected and retrieved.                           - External hemorrhoids.                           - The examination was otherwise normal on direct                            and retroflexion views. Recommendation:           - Repeat colonoscopy after studies are complete for                            surveillance based on pathology results.                            - Patient has a contact number available for                            emergencies. The signs and symptoms of potential                            delayed complications were discussed with the                            patient. Return to normal activities tomorrow.                            Written discharge instructions were provided to the                            patient.                           - Resume previous diet.                           -  Continue present medications.                           - Await pathology results. Ladene Artist, MD 03/06/2021 2:43:19 PM This report has been signed electronically.

## 2021-03-06 NOTE — Progress Notes (Signed)
Called to room to assist during endoscopic procedure.  Patient ID and intended procedure confirmed with present staff. Received instructions for my participation in the procedure from the performing physician.  

## 2021-03-06 NOTE — Progress Notes (Signed)
Report given to PACU, vss 

## 2021-03-08 ENCOUNTER — Telehealth: Payer: Self-pay

## 2021-03-08 NOTE — Telephone Encounter (Signed)
  Follow up Call-  Call back number 03/06/2021  Post procedure Call Back phone  # [phone]  Permission to leave phone message Yes  Some recent data might be hidden     Patient questions:  Do you have a fever, pain , or abdominal swelling? No. Pain Score  0 *  Have you tolerated food without any problems? Yes.    Have you been able to return to your normal activities? Yes.    Do you have any questions about your discharge instructions: Diet   No. Medications  No. Follow up visit  No.  Do you have questions or concerns about your Care? No.  Actions: * If pain score is 4 or above: No action needed, pain <4.

## 2021-03-23 ENCOUNTER — Encounter: Payer: Self-pay | Admitting: Gastroenterology

## 2021-07-08 ENCOUNTER — Other Ambulatory Visit: Payer: Self-pay | Admitting: Cardiovascular Disease

## 2021-07-11 NOTE — Telephone Encounter (Signed)
What is the correct instructions for the Crestor medication. There are two different one in the pt. Chart. Please advise.

## 2021-09-18 ENCOUNTER — Telehealth: Payer: Self-pay | Admitting: Cardiovascular Disease

## 2021-09-18 DIAGNOSIS — E782 Mixed hyperlipidemia: Secondary | ICD-10-CM

## 2021-09-18 DIAGNOSIS — I341 Nonrheumatic mitral (valve) prolapse: Secondary | ICD-10-CM

## 2021-09-18 NOTE — Telephone Encounter (Signed)
Called and spoke to patient. He states that he is going to be in [place] during the time that he is scheduled with Dr. Acie Fredrickson on 4/21. Appt with N[facility] changed to 5/1. Echo and labs scheduled for 4/10 before he goes to [place]. Patient appreciated call. ?

## 2021-09-18 NOTE — Telephone Encounter (Signed)
?  Per MyChart scheduling message: ? ?Please confirm that Dr. Acie Fredrickson wants me to do Labs and an echocardiagram prior to office visit.  I'm on an annual basis.   ?

## 2021-10-16 ENCOUNTER — Ambulatory Visit (HOSPITAL_COMMUNITY): Payer: 59 | Attending: Internal Medicine

## 2021-10-16 ENCOUNTER — Other Ambulatory Visit: Payer: 59 | Admitting: *Deleted

## 2021-10-16 DIAGNOSIS — E782 Mixed hyperlipidemia: Secondary | ICD-10-CM

## 2021-10-16 DIAGNOSIS — I341 Nonrheumatic mitral (valve) prolapse: Secondary | ICD-10-CM

## 2021-10-16 LAB — BASIC METABOLIC PANEL
BUN/Creatinine Ratio: 15 (ref 10–24)
BUN: 15 mg/dL (ref 8–27)
CO2: 25 mmol/L (ref 20–29)
Calcium: 9.1 mg/dL (ref 8.6–10.2)
Chloride: 102 mmol/L (ref 96–106)
Creatinine, Ser: 1.02 mg/dL (ref 0.76–1.27)
Glucose: 90 mg/dL (ref 70–99)
Potassium: 4.6 mmol/L (ref 3.5–5.2)
Sodium: 140 mmol/L (ref 134–144)
eGFR: 82 mL/min/{1.73_m2} (ref >59)

## 2021-10-16 LAB — LIPID PANEL
Chol/HDL Ratio: 2.7 ratio (ref 0.0–5.0)
Cholesterol, Total: 181 mg/dL (ref 100–199)
HDL: 66 mg/dL (ref >39)
LDL Chol Calc (NIH): 90 mg/dL (ref 0–99)
Triglycerides: 146 mg/dL (ref 0–149)
VLDL Cholesterol Cal: 25 mg/dL (ref 5–40)

## 2021-10-16 LAB — ECHOCARDIOGRAM COMPLETE
Area-P 1/2: 2.84 cm2
S' Lateral: 2.9 cm

## 2021-10-16 LAB — ALT: ALT: 26 IU/L (ref 0–44)

## 2021-10-27 ENCOUNTER — Ambulatory Visit: Payer: 59 | Admitting: Cardiovascular Disease

## 2021-11-06 ENCOUNTER — Encounter: Payer: Self-pay | Admitting: Cardiovascular Disease

## 2021-11-06 ENCOUNTER — Ambulatory Visit (INDEPENDENT_AMBULATORY_CARE_PROVIDER_SITE_OTHER): Payer: 59 | Admitting: Cardiovascular Disease

## 2021-11-06 VITALS — BP 118/68 | HR 67 | Ht 75.0 in | Wt 198.0 lb

## 2021-11-06 DIAGNOSIS — I341 Nonrheumatic mitral (valve) prolapse: Secondary | ICD-10-CM

## 2021-11-06 DIAGNOSIS — E782 Mixed hyperlipidemia: Secondary | ICD-10-CM

## 2021-11-06 NOTE — Progress Notes (Signed)
? ?Cardiology Office Note ? ? ?Date:  11/06/2021  ? ?ID:  Christopher Tanner, DOB Nov 14, 1955, MRN [id number] ? ?PCP:  Donnajean Lopes, MD  ?Cardiologist:   Mertie Moores, MD  ? ?Chief Complaint  ?Patient presents with  ? Mitral Valve Prolapse  ? ?1. Hyperlipidemia ?2. Mitral valve prolapse ? ? ? ?Christopher Tanner is a 66 y.o.  gentleman with a history of hyperlipidemia and mitral valve prolapse. He's done very well from a cardiac standpoint.    ?He has not had any episodes of chest pain or shortness of breath. He is exercising on a regular basis. ? ?Feb. 5, 2014: ?He is doing well.  He has had some shoulder issues and is having to scale back his exercise.   ?   ?  ?October 05, 2014: ? ?Christopher Tanner is a 66 y.o. male who presents for his MVP. ?No CP , no dyspnea.  ?No symptoms related to MVP,  ? ?October 18, 2015: ? ?Doing great.   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 ?Getting over a head cold several days. ? ?October 31, 2016: ? ?Christopher Tanner is seen today for his yearly visit.  ?He has a hx of hyperlipidemia  ?Exercising some .  Spin class on occasion.  ? ?Nov 22, 2017: ? Christopher Tanner is seen today for follow-up visit.  He has a history of mitral valve prolapse and hyperlipidemia. ? ?Feeling well , no dyspnea, no dizziness,  Goes to spin class.  Hikes, walks, jogs once a week.  ? ?Feb. 11, 2021: ? ?Doing well .   ?Had some leg cramps with rosuvastatin  ?He is now on rosuvastatin 3 days a week.   ?Father had his first MI at 49,  Then died at age 2.  ?Christopher Tanner is doing well  ?We discussed doing a coronary calcium score. ? ?Feb. 15, 2022: ? ?[name] is seen today for follow up of his MVP and HLD . ?Echo from Feb. 14, shows severe prolapse of his posterior leaflet of the MV with at least moderate MR ?Coronary calcium score was 2 agatston units - 26th for age / sex matched controls. ?No dyspnea. ?Has some numbness in his feet at night  ?We discussed TEE . ? ?Is doing intermittant fasting  ? ?Nov 06, 2021: ? ?Christopher Tanner is seen today for follow up of MVP ?Just got back from a 2  week [place] trip [employer] [facility] [employer] [facility], a break out group )  ? ?No CP , no dyspnea  ?Rare episodes of vertigo  ?Still working out on his [employer], is no longer running  ? ?Past Medical History:  ?Diagnosis Date  ? Anal fistula   ? Hyperlipidemia   ? on meds  ? Mitral valve prolapse   ? Last echocardiogram July, 2008. Moderate MVP of the posterior leaflet. Moderate mitral regurgitation. Normal left ventricular systolic function.  ? Numbness in feet   ? Plantar fasciitis   ? right  ? ? ?Past Surgical History:  ?Procedure Laterality Date  ? BUBBLE STUDY  09/08/2020  ? Procedure: BUBBLE STUDY;  Surgeon: Thayer Headings, MD;  Location: [place];  Service: Cardiovascular;;  ? COLONOSCOPY  03/06/2021  ? (DP-hems/normal.  2012), MS at [facility]  ? hand broken Left   ? Left hand bones- 4 different times  ? KNEE ARTHROSCOPY Left 2014  ? meniscectomy  ? SHOULDER SURGERY Left 12/2016  ? arthroscopy, biceps tendon repair  ? TEE WITHOUT CARDIOVERSION N/A 09/08/2020  ? Procedure: TRANSESOPHAGEAL ECHOCARDIOGRAM (TEE);  Surgeon: Acie Fredrickson [name], MD;  Location: [place];  Service: Cardiovascular;  Laterality: N/A;  ? TONSILLECTOMY    ? TREATMENT FISTULA ANAL  04/2011  ? WISDOM TOOTH EXTRACTION    ? ? ? ?Current Outpatient Medications  ?Medication Sig Dispense Refill  ? acetaminophen (TYLENOL) 325 MG tablet as needed.    ? aspirin 81 MG tablet Take 81 mg by mouth daily.    ? carboxymethylcellulose (REFRESH PLUS) 0.5 % SOLN Place 1 drop into both eyes 2 (two) times daily.    ? Coenzyme Q10 200 MG capsule Take 200 mg by mouth daily.    ? ibuprofen (ADVIL) 200 MG tablet as needed.    ? MAGNESIUM GLUCONATE PO Take 200 mg by mouth at bedtime.    ? Multiple Vitamins-Minerals (MULTIVITAL PERFORMANCE) TABS Take 1 tablet by mouth daily.    ? Omega-3 Fatty Acids (FISH OIL) 1200 MG CAPS Take 2,400 mg by mouth daily.    ? psyllium (METAMUCIL SMOOTH TEXTURE) 28 % packet Take 1 packet by mouth at bedtime.    ? rosuvastatin (CRESTOR)  5 MG tablet TAKE 1 TABLET BY MOUTH 3 TIMES A WEEK. 40 tablet 3  ? scopolamine (TRANSDERM-SCOP) 1 MG/3DAYS 1 Patch(s) Topical Every 3 Days PRN    ? sildenafil (REVATIO) 20 MG tablet daily as needed.    ? gabapentin (NEURONTIN) 100 MG capsule Take 100 mg by mouth at bedtime as needed. (Patient not taking: Reported on 11/06/2021)    ? GLUCOSAMINE-CHONDROITIN PO Take 1,500 tablets by mouth 3 (three) times daily. (Patient not taking: Reported on 11/06/2021)    ? nystatin ointment (MYCOSTATIN) Apply topically. Apply topically. (Patient not taking: Reported on 11/06/2021)    ? OVER THE COUNTER MEDICATION Take 2 tablets by mouth daily. Curcumasorb (Patient not taking: Reported on 11/06/2021)    ? ?Current Facility-Administered Medications  ?Medication Dose Route Frequency Provider Last Rate Last Admin  ? 0.9 %  sodium chloride infusion  500 mL Intravenous Once Ladene Artist, MD      ? ? ?Allergies:   Ezetimibe-simvastatin, Simvastatin, and Niaspan [niacin]  ? ? ?Social History:  The patient  reports that he has been smoking cigars. He has never used smokeless tobacco. He reports that he does not currently use alcohol after a past usage of about 10.0 standard drinks per week. He reports that he does not use drugs.  ? ?Family History:  The patient's family history includes Diabetes in his mother; Fibromyalgia in his mother; Heart disease in his father; Uterine cancer in his mother.  ? ? ?ROS: Noted in current history, otherwise review of systems is negative. ? ? ?Physical Exam: ?Blood pressure 118/68, pulse 67, height '6\' 3"'$  (1.905 m), weight 198 lb (89.8 kg), SpO2 98 %. ? ?GEN:  Well nourished, well developed in no acute distress ?HEENT: Normal ?NECK: No JVD; No carotid bruits ?LYMPHATICS: No lymphadenopathy ?CARDIAC: RRR , 3/6 systolic murmur holosystolic  ?RESPIRATORY:  Clear to auscultation without rales, wheezing or rhonchi  ?ABDOMEN: Soft, non-tender, non-distended ?MUSCULOSKELETAL:  No edema; No deformity  ?SKIN: Warm and  dry ?NEUROLOGIC:  Alert and oriented x 3 ? ?EKG:   Nov 06, 2021.  NSR at 67.  Normal ECG ? ? ?Recent Labs: ?10/16/2021: ALT 26; BUN 15; Creatinine, Ser 1.02; Potassium 4.6; Sodium 140  ? ? ?Lipid Panel ?   ?Component Value Date/Time  ? CHOL 181 10/16/2021 0733  ? CHOL 172 09/20/2014 0828  ? TRIG 146 10/16/2021 0733  ? TRIG 107  09/20/2014 0828  ? HDL 66 10/16/2021 0733  ? HDL 58 09/20/2014 0828  ? CHOLHDL 2.7 10/16/2021 0733  ? CHOLHDL 2.1 10/10/2015 0825  ? VLDL 13 10/10/2015 0825  ? [place] 90 10/16/2021 0733  ? [place] 93 09/20/2014 0828  ? ?  ? ?Wt Readings from Last 3 Encounters:  ?11/06/21 198 lb (89.8 kg)  ?03/06/21 190 lb (86.2 kg)  ?02/14/21 190 lb (86.2 kg)  ?  ? ? ?Other studies Reviewed: ?Additional studies/ records that were reviewed today include: . ?Review of the above records demonstrates:  ? ? ?ASSESSMENT AND PLAN: ? ?1.  Mitral valve prolapse:    His valve is stable.  He has a holosystolic murmur.  He is not having any signs or symptoms of worsening mitral valve prolapse and mitral regurgitation. ?We discussed that we did not currently have a mitral valve surgeon here in [place].  I would probably send him to see Gerrit Friends at [facility] if he needs to have something done soon. ? ? ?2. Hyperlipidemia: Lipid levels look great.  Continue Crestor 5 mg 3 times a week ? ?Current medicines are reviewed at length with the patient today.  The patient does not have concerns regarding medicines. ? ?The following changes have been made:  ? ?Labs/ tests ordered today include: ? ?Orders Placed This Encounter  ?Procedures  ? EKG 12-Lead  ? ? ? ?Disposition:    ? ? ? ?Mertie Moores, MD  ?11/06/2021 5:24 PM    ?[facility] ?[place], [place], [place]  [address] ?Phone: [phone]; Fax: [phone]  ?

## 2021-11-06 NOTE — Patient Instructions (Signed)
Medication Instructions:  ?Your physician recommends that you continue on your current medications as directed. Please refer to the Current Medication list given to you today. ? ?*If you need a refill on your cardiac medications before your next appointment, please call your pharmacy* ? ? ?Lab Work: ?None ?If you have labs (blood work) drawn today and your tests are completely normal, you will receive your results only by: ?MyChart Message (if you have MyChart) OR ?A paper copy in the mail ?If you have any lab test that is abnormal or we need to change your treatment, we will call you to review the results. ? ? ?Follow-Up: ?At [facility] you and your health needs are our priority.  As part of our continuing mission to provide you with exceptional heart care, we have created designated Provider Care Teams.  These Care Teams include your primary Cardiologist (physician) and Advanced Practice Providers (APPs -  Physician Assistants and Nurse Practitioners) who all work together to provide you with the care you need, when you need it. ? ? ?Your next appointment:   ?1 year(s) ? ?The format for your next appointment:   ?In Person ? ?Provider:   ?Mertie Moores, MD   ? ? ? ? ?  ?

## 2022-03-19 ENCOUNTER — Encounter: Payer: Self-pay | Admitting: Cardiovascular Disease

## 2022-06-12 ENCOUNTER — Other Ambulatory Visit: Payer: Self-pay | Admitting: Cardiovascular Disease

## 2022-08-25 ENCOUNTER — Other Ambulatory Visit: Payer: Self-pay | Admitting: Cardiovascular Disease

## 2022-11-22 ENCOUNTER — Other Ambulatory Visit: Payer: Self-pay | Admitting: Cardiovascular Disease

## 2023-01-23 HISTORY — PX: OTHER SURGICAL HISTORY: SHX169

## 2023-02-11 ENCOUNTER — Encounter: Payer: Self-pay | Admitting: Cardiovascular Disease

## 2023-02-11 NOTE — Progress Notes (Unsigned)
Cardiology Office Note   Date:  02/12/2023   ID:  Christopher Tanner, Christopher Tanner 01-Oct-1955, MRN [id number]  PCP:  Garlan Fillers, MD  Cardiologist:   Kristeen Miss, MD   Chief Complaint  Patient presents with   mv repair   1. Hyperlipidemia 2. Mitral valve prolapse    Christopher Tanner is a 67 y.o.  gentleman with a history of hyperlipidemia and mitral valve prolapse. He's done very well from a cardiac standpoint.    He has not had any episodes of chest pain or shortness of breath. He is exercising on a regular basis.  Feb. 5, 2014: He is doing well.  He has had some shoulder issues and is having to scale back his exercise.        October 05, 2014:  Christopher Tanner is a 67 y.o. male who presents for his MVP. No CP , no dyspnea.  No symptoms related to MVP,   October 18, 2015:  Doing great.   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  October 31, 2016:  Christopher Tanner is seen today for his yearly visit.  He has a hx of hyperlipidemia  Exercising some .  Spin class on occasion.   Nov 22, 2017:  Christopher Tanner is seen today for follow-up visit.  He has a history of mitral valve prolapse and hyperlipidemia.  Feeling well , no dyspnea, no dizziness,  Goes to spin class.  Hikes, walks, jogs once a week.   Feb. 11, 2021:  Doing well .   Had some leg cramps with rosuvastatin  He is now on rosuvastatin 3 days a week.   Father had his first MI at 79,  Then died at age 24.  Christopher Tanner is doing well  We discussed doing a coronary calcium score.  Feb. 15, 2022:  Christopher Tanner is seen today for follow up of his MVP and HLD . Echo from Feb. 14, shows severe prolapse of his posterior leaflet of the MV with at least moderate MR Coronary calcium score was 2 agatston units - 26th for age / sex matched controls. No dyspnea. Has some numbness in his feet at night  We discussed TEE .  Is doing intermittant fasting   Nov 06, 2021:  Christopher Tanner is seen today for follow up of MVP Just got back from a 2 week [place]  trip [employer] [facility], a break out group )   No CP , no dyspnea  Rare episodes of vertigo  Still working out on his [employer], is no longer running    Aug. 6, 2024 Christopher Tanner has had MV repair at the [facility] ( January 23, 2023)  since his last office visit  He is here to follow up post operative follow up  Had  Divinci robotic repair of his MV  Is on ASA  Has remained in NSR   Had a suture coming through through his skin  The suture was bathed in Betadine and snipped off to the skin.  His other wounds look like they are healing very well.  Will enroll him in cardiac rehab  Wrote for SBE prophylaxix   Past Medical History:  Diagnosis Date   Anal fistula    Hyperlipidemia    on meds   Mitral valve prolapse    Last echocardiogram July, 2008. Moderate MVP of the posterior leaflet. Moderate mitral regurgitation. Normal left ventricular systolic function.   Numbness in feet    Plantar fasciitis  right    Past Surgical History:  Procedure Laterality Date   BUBBLE STUDY  09/08/2020   Procedure: BUBBLE STUDY;  Surgeon: [name], MD;  Location: [facility] ENDOSCOPY;  Service: Cardiovascular;;   COLONOSCOPY  03/06/2021   (DP-hems/normal.  2012), MS at [facility]   hand broken Left    Left hand bones- 4 different times   KNEE ARTHROSCOPY Left 2014   meniscectomy   SHOULDER SURGERY Left 12/2016   arthroscopy, biceps tendon repair   TEE WITHOUT CARDIOVERSION N/A 09/08/2020   Procedure: TRANSESOPHAGEAL ECHOCARDIOGRAM (TEE);  Surgeon: Elease Hashimoto Deloris Ping, MD;  Location: [facility] ENDOSCOPY;  Service: Cardiovascular;  Laterality: N/A;   TONSILLECTOMY     TREATMENT FISTULA ANAL  04/2011   WISDOM TOOTH EXTRACTION       Current Outpatient Medications  Medication Sig Dispense Refill   acetaminophen (TYLENOL) 325 MG tablet as needed.     aspirin 81 MG tablet Take 81 mg by mouth daily.     carboxymethylcellulose (REFRESH PLUS) 0.5 % SOLN Place 1 drop into both eyes 2 (two) times  daily.     ibuprofen (ADVIL) 200 MG tablet as needed.     metoprolol tartrate (LOPRESSOR) 25 MG tablet Take 12.5 mg by mouth 2 (two) times daily.     Multiple Vitamins-Minerals (MULTIVITAL PERFORMANCE) TABS Take 1 tablet by mouth daily.     psyllium (METAMUCIL SMOOTH TEXTURE) 28 % packet Take 1 packet by mouth at bedtime.     rosuvastatin (CRESTOR) 5 MG tablet TAKE 1 TABLET (5 MG TOTAL) BY MOUTH DAILY. 30 tablet 0   sildenafil (REVATIO) 20 MG tablet daily as needed.     Current Facility-Administered Medications  Medication Dose Route Frequency Provider Last Rate Last Admin   0.9 %  sodium chloride infusion  500 mL Intravenous Once Meryl Dare, MD        Allergies:   Ezetimibe-simvastatin, Simvastatin, and Niaspan [niacin]    Social History:  The patient  reports that he has been smoking cigars. He has never used smokeless tobacco. He reports that he does not currently use alcohol after a past usage of about 10.0 standard drinks of alcohol per week. He reports that he does not use drugs.   Family History:  The patient's family history includes Diabetes in his mother; Fibromyalgia in his mother; Heart disease in his father; Uterine cancer in his mother.    ROS: Noted in current history, otherwise review of systems is negative.   Physical Exam: Blood pressure 112/80, pulse 77, height 6\' 3"  (1.905 m), weight 191 lb (86.6 kg), SpO2 97%.       GEN:  Well nourished, well developed in no acute distress HEENT: Normal NECK: No JVD; No carotid bruits LYMPHATICS: No lymphadenopathy CARDIAC: RRR , no murmurs, rubs, gallops Has multiple well-healing scars from his surgery.  He had 2 sites where sutures were coming through the skin.  These were bathed with Betadine and snipped off at the skin. RESPIRATORY:  Clear to auscultation without rales, wheezing or rhonchi  ABDOMEN: Soft, non-tender, non-distended MUSCULOSKELETAL:  No edema; No deformity , he has a wound in his right groin from the  heart-lung machine.  This is minimally swollen.  There is no evidence of infection.  It is healing well. SKIN: Warm and dry NEUROLOGIC:  Alert and oriented x 3   EKG:   EKG Interpretation Date/Time:  Tuesday February 12 2023 13:38:32 EDT Ventricular Rate:  73 PR Interval:  168 QRS Duration:  90  QT Interval:  438 QTC Calculation: 482 R Axis:   68  Text Interpretation: Normal sinus rhythm Prolonged QT No previous ECGs available Confirmed by Kristeen Miss ([id number]) on 02/12/2023 1:43:00 PM     Recent Labs: No results found for requested labs within last 365 days.    Lipid Panel    Component Value Date/Time   CHOL 181 10/16/2021 0733   CHOL 172 09/20/2014 0828   TRIG 146 10/16/2021 0733   TRIG 107 09/20/2014 0828   HDL 66 10/16/2021 0733   HDL 58 09/20/2014 0828   CHOLHDL 2.7 10/16/2021 0733   CHOLHDL 2.1 10/10/2015 0825   VLDL 13 10/10/2015 0825   LDLCALC 90 10/16/2021 0733   LDLCALC 93 09/20/2014 0828      Wt Readings from Last 3 Encounters:  02/12/23 191 lb (86.6 kg)  11/06/21 198 lb (89.8 kg)  03/06/21 190 lb (86.2 kg)      Other studies Reviewed: Additional studies/ records that were reviewed today include: . Review of the above records demonstrates:    ASSESSMENT AND PLAN:  1.  Mitral valve prolapse:     Status post surgical repair at the [facility] clinic.  He is overall doing very well.  He has maintained sinus rhythm.  Will refill his metoprolol. I have given him a prescription for amoxicillin 4 tablets before any dental procedure including cleaning or other procedures.  Will schedule him for cardiac rehab. I will see him again in 2 months for a follow-up visit.  Overall I am very pleased with how he is doing.   2. Hyperlipidemia:   Current medicines are reviewed at length with the patient today.  The patient does not have concerns regarding medicines.  The following changes have been made:   Labs/ tests ordered today include:  Orders Placed This  Encounter  Procedures   EKG 12-Lead     Disposition:       Kristeen Miss, MD  02/12/2023 2:14 PM    [facility] [address] [place], [place], [place]  [address] Phone: [phone]; Fax: [phone]

## 2023-02-12 ENCOUNTER — Ambulatory Visit: Payer: 59 | Attending: Cardiovascular Disease | Admitting: Cardiovascular Disease

## 2023-02-12 ENCOUNTER — Encounter: Payer: Self-pay | Admitting: Cardiovascular Disease

## 2023-02-12 VITALS — BP 112/80 | HR 77 | Ht 75.0 in | Wt 191.0 lb

## 2023-02-12 DIAGNOSIS — E782 Mixed hyperlipidemia: Secondary | ICD-10-CM | POA: Diagnosis not present

## 2023-02-12 DIAGNOSIS — I341 Nonrheumatic mitral (valve) prolapse: Secondary | ICD-10-CM | POA: Diagnosis not present

## 2023-02-12 DIAGNOSIS — Z79899 Other long term (current) drug therapy: Secondary | ICD-10-CM

## 2023-02-12 MED ORDER — AMOXICILLIN 500 MG PO CAPS
ORAL_CAPSULE | ORAL | 4 refills | Status: AC
Start: 2023-02-12 — End: ?

## 2023-02-12 MED ORDER — METOPROLOL TARTRATE 25 MG PO TABS
12.5000 mg | ORAL_TABLET | Freq: Two times a day (BID) | ORAL | 3 refills | Status: DC
Start: 2023-02-12 — End: 2023-04-29

## 2023-02-12 NOTE — Patient Instructions (Signed)
Medication Instructions:  TAKE Amoxicillin 500mg  #4 capsules by mouth one hour prior to dental cleanings/procedures REFILLED Metoprolol 12.5mg   *If you need a refill on your cardiac medications before your next appointment, please call your pharmacy*   Lab Work: CBC, BMET day prior to next visit If you have labs (blood work) drawn today and your tests are completely normal, you will receive your results only by: MyChart Message (if you have MyChart) OR A paper copy in the mail If you have any lab test that is abnormal or we need to change your treatment, we will call you to review the results.   Testing/Procedures: Ambulator referral to cardiac rehabilitation   Follow-Up: At [facility], you and your health needs are our priority.  As part of our continuing mission to provide you with exceptional heart care, we have created designated Provider Care Teams.  These Care Teams include your primary Cardiologist (physician) and Advanced Practice Providers (APPs -  Physician Assistants and Nurse Practitioners) who all work together to provide you with the care you need, when you need it.  Your next appointment:   2 month(s)  Provider:   Kristeen Miss, MD

## 2023-02-18 ENCOUNTER — Encounter: Payer: Self-pay | Admitting: Cardiovascular Disease

## 2023-02-19 ENCOUNTER — Ambulatory Visit: Payer: 59 | Admitting: Cardiovascular Disease

## 2023-02-22 ENCOUNTER — Encounter (HOSPITAL_COMMUNITY): Payer: Self-pay

## 2023-02-22 ENCOUNTER — Telehealth (HOSPITAL_COMMUNITY): Payer: Self-pay

## 2023-02-22 NOTE — Telephone Encounter (Signed)
Called patient to see if he was interested in participating in the Cardiac Rehab Program. Patient stated yes. Patient will come in for orientation on 8/20@830  and will attend the 815 exercise class.   Sent package.

## 2023-02-22 NOTE — Telephone Encounter (Signed)
Pt insurance is active and benefits verified through [facility] Co-pay 0, DED $6,000/$6,000 met, out of pocket $6,000/$6,000 met, co-insurance 10%. no pre-authorization required, Ann/[employer] 02/22/2023@1 :06, REF# [id number]   How many CR sessions are covered? (for ICR)no limit Is this a lifetime maximum or an annual maximum? annual Has the member used any of these services to date? no Is there a time limit (weeks/months) on start of program and/or program completion? no

## 2023-02-26 ENCOUNTER — Encounter (HOSPITAL_COMMUNITY): Payer: Self-pay

## 2023-02-26 ENCOUNTER — Encounter (HOSPITAL_COMMUNITY)
Admission: RE | Admit: 2023-02-26 | Discharge: 2023-02-26 | Disposition: A | Discharge status: Home / Self Care | Payer: 59 | Source: Ambulatory Visit | Attending: Cardiovascular Disease | Admitting: Cardiovascular Disease

## 2023-02-26 VITALS — BP 102/70 | HR 67 | Ht 74.75 in | Wt 195.3 lb

## 2023-02-26 DIAGNOSIS — Z9889 Other specified postprocedural states: Secondary | ICD-10-CM | POA: Diagnosis present

## 2023-02-26 DIAGNOSIS — Z48812 Encounter for surgical aftercare following surgery on the circulatory system: Secondary | ICD-10-CM | POA: Diagnosis not present

## 2023-02-26 NOTE — Progress Notes (Addendum)
Cardiac Individual Treatment Plan  Patient Details  Name: Christopher Tanner MRN: [id number] Date of Birth: 1956/05/07 Referring Provider:   Flowsheet Row INTENSIVE CARDIAC REHAB ORIENT from 02/26/2023 in [facility]  Referring Provider Nahser, Deloris Ping, MD.       Initial Encounter Date:  Flowsheet Row INTENSIVE CARDIAC REHAB ORIENT from 02/26/2023 in [facility]  Date 02/26/23       Visit Diagnosis: 01/23/23 MVR (mitral valve repair) at the [facility]  Patient's Home Medications on Admission:  Current Outpatient Medications:    acetaminophen (TYLENOL) 325 MG tablet, as needed., Disp: , Rfl:    amoxicillin (AMOXIL) 500 MG capsule, Take 4 capsules by mouth one hour prior to dental cleanings/procedures, Disp: 16 capsule, Rfl: 4   aspirin 81 MG tablet, Take 81 mg by mouth daily., Disp: , Rfl:    carboxymethylcellulose (REFRESH PLUS) 0.5 % SOLN, Place 1 drop into both eyes 2 (two) times daily., Disp: , Rfl:    ibuprofen (ADVIL) 200 MG tablet, as needed., Disp: , Rfl:    metoprolol tartrate (LOPRESSOR) 25 MG tablet, Take 0.5 tablets (12.5 mg total) by mouth 2 (two) times daily., Disp: 90 tablet, Rfl: 3   Multiple Vitamins-Minerals (MULTIVITAL PERFORMANCE) TABS, Take 1 tablet by mouth daily., Disp: , Rfl:    psyllium (METAMUCIL SMOOTH TEXTURE) 28 % packet, Take 1 packet by mouth at bedtime., Disp: , Rfl:    rosuvastatin (CRESTOR) 5 MG tablet, TAKE 1 TABLET (5 MG TOTAL) BY MOUTH DAILY. (Patient taking differently: Take 5 mg by mouth every other day.), Disp: 30 tablet, Rfl: 0   sildenafil (REVATIO) 20 MG tablet, daily as needed., Disp: , Rfl:   Current Facility-Administered Medications:    0.9 %  sodium chloride infusion, 500 mL, Intravenous, Once, Meryl Dare, MD  Past Medical History: Past Medical History:  Diagnosis Date   Anal fistula    Hyperlipidemia    on  meds   Mitral valve prolapse    Last echocardiogram July, 2008. Moderate MVP of the posterior leaflet. Moderate mitral regurgitation. Normal left ventricular systolic function.   Numbness in feet    Plantar fasciitis    right    Tobacco Use: Social History   Tobacco Use  Smoking Status Light Smoker   Types: Cigars  Smokeless Tobacco Never  Tobacco Comments   Occasional,  " smokes cigars at weddings    Labs: Review Flowsheet  More data exists      Latest Ref Rng & Units 11/22/2017 02/24/2019 06/03/2019 08/19/2020 10/16/2021  Labs for ITP Cardiac and Pulmonary Rehab  Cholestrol 100 - 199 mg/dL 409  811  914  782  956   LDL (calc) 0 - 99 mg/dL 95  213  92  87  90   HDL-C >39 mg/dL 66  61  62  65  66   Trlycerides 0 - 149 mg/dL 086  578  99  91  469     Details            Capillary Blood Glucose: No results found for: "GLUCAP"   Exercise Target Goals: Exercise Program Goal: Individual exercise prescription set using results from initial 6 min walk test and THRR while considering  patient's activity barriers and safety.   Exercise Prescription Goal: Initial exercise prescription builds to 30-45 minutes a day of aerobic activity, 2-3 days per week.  Home exercise guidelines  will be given to patient during program as part of exercise prescription that the participant will acknowledge.  Activity Barriers & Risk Stratification:  Activity Barriers & Cardiac Risk Stratification - 02/26/23 0901       Activity Barriers & Cardiac Risk Stratification   Activity Barriers Other (comment)    Comments Numbness- both feet, plantar fasciitis- right foot, h/x fx x4 - left hand, left knee arthroscopy/ meniscectomy, left shoulder arthroscopy/ biceps tendon repair.    Cardiac Risk Stratification Low             6 Minute Walk:  6 Minute Walk     Row Name 02/26/23 0930         6 Minute Walk   Phase Initial     Distance 1805 feet     Walk Time 6 minutes     # of Rest Breaks  0     MPH 3.42     METS 4.17     RPE 11     Perceived Dyspnea  0     VO2 Peak 14.61     Symptoms No     Resting HR 67 bpm     Resting BP 102/70     Resting Oxygen Saturation  98 %     Exercise Oxygen Saturation  during 6 min walk 97 %     Max Ex. HR 79 bpm     Max Ex. BP 140/78     2 Minute Post BP 116/78              Oxygen Initial Assessment:   Oxygen Re-Evaluation:   Oxygen Discharge (Final Oxygen Re-Evaluation):   Initial Exercise Prescription:  Initial Exercise Prescription - 02/26/23 1000       Date of Initial Exercise RX and Referring Provider   Date 02/26/23    Referring Provider Nahser, Deloris Ping, MD.    Expected Discharge Date 05/22/23      Bike   Level 3    Watts 60    Minutes 15    METs 4.2      Elliptical   Level 1    Speed 1    Minutes 15    METs 4.2      Prescription Details   Frequency (times per week) 3    Duration Progress to 30 minutes of continuous aerobic without signs/symptoms of physical distress      Intensity   THRR 40-80% of Max Heartrate 62-123    Ratings of Perceived Exertion 11-13    Perceived Dyspnea 0-4      Progression   Progression Continue to progress workloads to maintain intensity without signs/symptoms of physical distress.      Resistance Training   Training Prescription Yes    Weight 5 lbs    Reps 10-15             Perform Capillary Blood Glucose checks as needed.  Exercise Prescription Changes:   Exercise Comments:   Exercise Goals and Review:   Exercise Goals     Row Name 02/26/23 0903             Exercise Goals   Increase Physical Activity Yes       Intervention Provide advice, education, support and counseling about physical activity/exercise needs.;Develop an individualized exercise prescription for aerobic and resistive training based on initial evaluation findings, risk stratification, comorbidities and participant's personal goals.       Expected Outcomes Short Term: Attend  rehab on a regular  basis to increase amount of physical activity.;Long Term: Add in home exercise to make exercise part of routine and to increase amount of physical activity.;Long Term: Exercising regularly at least 3-5 days a week.       Increase Strength and Stamina Yes       Intervention Provide advice, education, support and counseling about physical activity/exercise needs.;Develop an individualized exercise prescription for aerobic and resistive training based on initial evaluation findings, risk stratification, comorbidities and participant's personal goals.       Expected Outcomes Short Term: Increase workloads from initial exercise prescription for resistance, speed, and METs.;Short Term: Perform resistance training exercises routinely during rehab and add in resistance training at home;Long Term: Improve cardiorespiratory fitness, muscular endurance and strength as measured by increased METs and functional capacity ( )       Able to understand and use rate of perceived exertion (RPE) scale Yes       Intervention Provide education and explanation on how to use RPE scale       Expected Outcomes Short Term: Able to use RPE daily in rehab to express subjective intensity level;Long Term:  Able to use RPE to guide intensity level when exercising independently       Knowledge and understanding of Target Heart Rate Range (THRR) Yes       Intervention Provide education and explanation of THRR including how the numbers were predicted and where they are located for reference       Expected Outcomes Short Term: Able to state/look up THRR;Long Term: Able to use THRR to govern intensity when exercising independently;Short Term: Able to use daily as guideline for intensity in rehab       Able to check pulse independently Yes       Intervention Provide education and demonstration on how to check pulse in carotid and radial arteries.;Review the importance of being able to check your own pulse for safety  during independent exercise       Expected Outcomes Short Term: Able to explain why pulse checking is important during independent exercise;Long Term: Able to check pulse independently and accurately       Understanding of Exercise Prescription Yes       Intervention Provide education, explanation, and written materials on patient's individual exercise prescription       Expected Outcomes Short Term: Able to explain program exercise prescription;Long Term: Able to explain home exercise prescription to exercise independently                Exercise Goals Re-Evaluation :   Discharge Exercise Prescription (Final Exercise Prescription Changes):   Nutrition:  Target Goals: Understanding of nutrition guidelines, daily intake of sodium 1500mg , cholesterol 200mg , calories 30% from fat and 7% or less from saturated fats, daily to have 5 or more servings of fruits and vegetables.  Biometrics:  Pre Biometrics - 02/26/23 0815       Pre Biometrics   Waist Circumference 40.25 inches    Hip Circumference 41.75 inches    Waist to Hip Ratio 0.96 %    Triceps Skinfold 12 mm    % Body Fat 25 %    Grip Strength 46 kg    Flexibility 8 in    Single Leg Stand 30 seconds              Nutrition Therapy Plan and Nutrition Goals:   Nutrition Assessments:  MEDIFICTS Score Key: ?70 Need to make dietary changes  40-70 Heart Healthy Diet ? 40 Therapeutic  Level Cholesterol Diet    Picture Your Plate Scores: <78 Unhealthy dietary pattern with much room for improvement. 41-50 Dietary pattern unlikely to meet recommendations for good health and room for improvement. 51-60 More healthful dietary pattern, with some room for improvement.  >60 Healthy dietary pattern, although there may be some specific behaviors that could be improved.    Nutrition Goals Re-Evaluation:   Nutrition Goals Re-Evaluation:   Nutrition Goals Discharge (Final Nutrition Goals  Re-Evaluation):   Psychosocial: Target Goals: Acknowledge presence or absence of significant depression and/or stress, maximize coping skills, provide positive support system. Participant is able to verbalize types and ability to use techniques and skills needed for reducing stress and depression.  Initial Review & Psychosocial Screening:  Initial Psych Review & Screening - 02/26/23 0936       Initial Review   Current issues with None Identified      Family Dynamics   Good Support System? Yes   Turk has his wife for support     Barriers   Psychosocial barriers to participate in program There are no identifiable barriers or psychosocial needs.      Screening Interventions   Interventions Encouraged to exercise             Quality of Life Scores:  Quality of Life - 02/26/23 0859       Quality of Life   Select Quality of Life      Quality of Life Scores   Health/Function Pre 28.7 %    Socioeconomic Pre 28.75 %    Psych/Spiritual Pre 28.93 %    Family Pre 30 %    GLOBAL Pre 28.94 %            Scores of 19 and below usually indicate a poorer quality of life in these areas.  A difference of  2-3 points is a clinically meaningful difference.  A difference of 2-3 points in the total score of the Quality of Life Index has been associated with significant improvement in overall quality of life, self-image, physical symptoms, and general health in studies assessing change in quality of life.  PHQ-9: Review Flowsheet       02/26/2023  Depression screen PHQ 2/9  Decreased Interest 1  Down, Depressed, Hopeless 0  PHQ - 2 Score 1  Altered sleeping 1  Tired, decreased energy 1  Change in appetite 0  Feeling bad or failure about yourself  0  Trouble concentrating 0  Moving slowly or fidgety/restless 0  Suicidal thoughts 0  PHQ-9 Score 3  Difficult doing work/chores Not difficult at all    Details           Interpretation of Total Score  Total Score Depression  Severity:  1-4 = Minimal depression, 5-9 = Mild depression, 10-14 = Moderate depression, 15-19 = Moderately severe depression, 20-27 = Severe depression   Psychosocial Evaluation and Intervention:   Psychosocial Re-Evaluation:   Psychosocial Discharge (Final Psychosocial Re-Evaluation):   Vocational Rehabilitation: Provide vocational rehab assistance to qualifying candidates.   Vocational Rehab Evaluation & Intervention:  Vocational Rehab - 02/26/23 2956       Initial Vocational Rehab Evaluation & Intervention   Assessment shows need for Vocational Rehabilitation No   Marcelis is retired and does not need vocational rehab at this time            Education: Education Goals: Education classes will be provided on a weekly basis, covering required topics. Participant will state understanding/return demonstration of topics  presented.     Core Videos: Exercise    Move It!  Clinical staff conducted group or individual video education with verbal and written material and guidebook.  Patient learns the recommended Pritikin exercise program. Exercise with the goal of living a long, healthy life. Some of the health benefits of exercise include controlled diabetes, healthier blood pressure levels, improved cholesterol levels, improved heart and lung capacity, improved sleep, and better body composition. Everyone should speak with their doctor before starting or changing an exercise routine.  Biomechanical Limitations Clinical staff conducted group or individual video education with verbal and written material and guidebook.  Patient learns how biomechanical limitations can impact exercise and how we can mitigate and possibly overcome limitations to have an impactful and balanced exercise routine.  Body Composition Clinical staff conducted group or individual video education with verbal and written material and guidebook.  Patient learns that body composition (ratio of muscle mass to fat  mass) is a key component to assessing overall fitness, rather than body weight alone. Increased fat mass, especially visceral belly fat, can put [place] at increased risk for metabolic syndrome, type 2 diabetes, heart disease, and even death. It is recommended to combine diet and exercise (cardiovascular and resistance training) to improve your body composition. Seek guidance from your physician and exercise physiologist before implementing an exercise routine.  Exercise Action Plan Clinical staff conducted group or individual video education with verbal and written material and guidebook.  Patient learns the recommended strategies to achieve and enjoy long-term exercise adherence, including variety, self-motivation, self-efficacy, and positive decision making. Benefits of exercise include fitness, good health, weight management, more energy, better sleep, less stress, and overall well-being.  Medical   Heart Disease Risk Reduction Clinical staff conducted group or individual video education with verbal and written material and guidebook.  Patient learns our heart is our most vital organ as it circulates oxygen, nutrients, white blood cells, and hormones throughout the entire body, and carries waste away. Data supports a plant-based eating plan like the Pritikin Program for its effectiveness in slowing progression of and reversing heart disease. The video provides a number of recommendations to address heart disease.   Metabolic Syndrome and Belly Fat  Clinical staff conducted group or individual video education with verbal and written material and guidebook.  Patient learns what metabolic syndrome is, how it leads to heart disease, and how one can reverse it and keep it from coming back. You have metabolic syndrome if you have 3 of the following 5 criteria: abdominal obesity, high blood pressure, high triglycerides, low HDL cholesterol, and high blood sugar.  Hypertension and Heart Disease Clinical staff  conducted group or individual video education with verbal and written material and guidebook.  Patient learns that high blood pressure, or hypertension, is very common in the [place]. Hypertension is largely due to excessive salt intake, but other important risk factors include being overweight, physical inactivity, drinking too much alcohol, smoking, and not eating enough potassium from fruits and vegetables. High blood pressure is a leading risk factor for heart attack, stroke, congestive heart failure, dementia, kidney failure, and premature death. Long-term effects of excessive salt intake include stiffening of the arteries and thickening of heart muscle and organ damage. Recommendations include ways to reduce hypertension and the risk of heart disease.  Diseases of Our Time - Focusing on Diabetes Clinical staff conducted group or individual video education with verbal and written material and guidebook.  Patient learns why the best way to  stop diseases of our time is prevention, through food and other lifestyle changes. Medicine (such as prescription pills and surgeries) is often only a Band-Aid on the problem, not a long-term solution. Most common diseases of our time include obesity, type 2 diabetes, hypertension, heart disease, and cancer. The Pritikin Program is recommended and has been proven to help reduce, reverse, and/or prevent the damaging effects of metabolic syndrome.  Nutrition   Overview of the Pritikin Eating Plan  Clinical staff conducted group or individual video education with verbal and written material and guidebook.  Patient learns about the Pritikin Eating Plan for disease risk reduction. The Pritikin Eating Plan emphasizes a wide variety of unrefined, minimally-processed carbohydrates, like fruits, vegetables, whole grains, and legumes. Go, Caution, and Stop food choices are explained. Plant-based and lean animal proteins are emphasized. Rationale provided for low sodium  intake for blood pressure control, low added sugars for blood sugar stabilization, and low added fats and oils for coronary artery disease risk reduction and weight management.  Calorie Density  Clinical staff conducted group or individual video education with verbal and written material and guidebook.  Patient learns about calorie density and how it impacts the Pritikin Eating Plan. Knowing the characteristics of the food you choose will help you decide whether those foods will lead to weight gain or weight loss, and whether you want to consume more or less of them. Weight loss is usually a side effect of the Pritikin Eating Plan because of its focus on low calorie-dense foods.  Label Reading  Clinical staff conducted group or individual video education with verbal and written material and guidebook.  Patient learns about the Pritikin recommended label reading guidelines and corresponding recommendations regarding calorie density, added sugars, sodium content, and whole grains.  Dining Out - Part 1  Clinical staff conducted group or individual video education with verbal and written material and guidebook.  Patient learns that restaurant meals can be sabotaging because they can be so high in calories, fat, sodium, and/or sugar. Patient learns recommended strategies on how to positively address this and avoid unhealthy pitfalls.  Facts on Fats  Clinical staff conducted group or individual video education with verbal and written material and guidebook.  Patient learns that lifestyle modifications can be just as effective, if not more so, as many medications for lowering your risk of heart disease. A Pritikin lifestyle can help to reduce your risk of inflammation and atherosclerosis (cholesterol build-up, or plaque, in the artery walls). Lifestyle interventions such as dietary choices and physical activity address the cause of atherosclerosis. A review of the types of fats and their impact on blood  cholesterol levels, along with dietary recommendations to reduce fat intake is also included.  Nutrition Action Plan  Clinical staff conducted group or individual video education with verbal and written material and guidebook.  Patient learns how to incorporate Pritikin recommendations into their lifestyle. Recommendations include planning and keeping personal health goals in mind as an important part of their success.  Healthy Mind-Set    Healthy Minds, Bodies, Hearts  Clinical staff conducted group or individual video education with verbal and written material and guidebook.  Patient learns how to identify when they are stressed. Video will discuss the impact of that stress, as well as the many benefits of stress management. Patient will also be introduced to stress management techniques. The way we think, act, and feel has an impact on our hearts.  How Our Thoughts Can Heal Our Hearts  Clinical staff  conducted group or individual video education with verbal and written material and guidebook.  Patient learns that negative thoughts can cause depression and anxiety. This can result in negative lifestyle behavior and serious health problems. Cognitive behavioral therapy is an effective method to help control our thoughts in order to change and improve our emotional outlook.  Additional Videos:  Exercise    Improving Performance  Clinical staff conducted group or individual video education with verbal and written material and guidebook.  Patient learns to use a non-linear approach by alternating intensity levels and lengths of time spent exercising to help burn more calories and lose more body fat. Cardiovascular exercise helps improve heart health, metabolism, hormonal balance, blood sugar control, and recovery from fatigue. Resistance training improves strength, endurance, balance, coordination, reaction time, metabolism, and muscle mass. Flexibility exercise improves circulation, posture, and  balance. Seek guidance from your physician and exercise physiologist before implementing an exercise routine and learn your capabilities and proper form for all exercise.  Introduction to Yoga  Clinical staff conducted group or individual video education with verbal and written material and guidebook.  Patient learns about yoga, a discipline of the coming together of mind, breath, and body. The benefits of yoga include improved flexibility, improved range of motion, better posture and core strength, increased lung function, weight loss, and positive self-image. Yoga's heart health benefits include lowered blood pressure, healthier heart rate, decreased cholesterol and triglyceride levels, improved immune function, and reduced stress. Seek guidance from your physician and exercise physiologist before implementing an exercise routine and learn your capabilities and proper form for all exercise.  Medical   Aging: Enhancing Your Quality of Life  Clinical staff conducted group or individual video education with verbal and written material and guidebook.  Patient learns key strategies and recommendations to stay in good physical health and enhance quality of life, such as prevention strategies, having an advocate, securing a Health Care Proxy and Power of Attorney, and keeping a list of medications and system for tracking them. It also discusses how to avoid risk for bone loss.  Biology of Weight Control  Clinical staff conducted group or individual video education with verbal and written material and guidebook.  Patient learns that weight gain occurs because we consume more calories than we burn (eating more, moving less). Even if your body weight is normal, you may have higher ratios of fat compared to muscle mass. Too much body fat puts you at increased risk for cardiovascular disease, heart attack, stroke, type 2 diabetes, and obesity-related cancers. In addition to exercise, following the Pritikin Eating  Plan can help reduce your risk.  Decoding Lab Results  Clinical staff conducted group or individual video education with verbal and written material and guidebook.  Patient learns that lab test reflects one measurement whose values change over time and are influenced by many factors, including medication, stress, sleep, exercise, food, hydration, pre-existing medical conditions, and more. It is recommended to use the knowledge from this video to become more involved with your lab results and evaluate your numbers to speak with your doctor.   Diseases of Our Time - Overview  Clinical staff conducted group or individual video education with verbal and written material and guidebook.  Patient learns that according to the CDC, 50% to 70% of chronic diseases (such as obesity, type 2 diabetes, elevated lipids, hypertension, and heart disease) are avoidable through lifestyle improvements including healthier food choices, listening to satiety cues, and increased physical activity.  Sleep Disorders Clinical  staff conducted group or individual video education with verbal and written material and guidebook.  Patient learns how good quality and duration of sleep are important to overall health and well-being. Patient also learns about sleep disorders and how they impact health along with recommendations to address them, including discussing with a physician.  Nutrition  Dining Out - Part 2 Clinical staff conducted group or individual video education with verbal and written material and guidebook.  Patient learns how to plan ahead and communicate in order to maximize their dining experience in a healthy and nutritious manner. Included are recommended food choices based on the type of restaurant the patient is visiting.   Fueling a Banker conducted group or individual video education with verbal and written material and guidebook.  There is a strong connection between our food choices  and our health. Diseases like obesity and type 2 diabetes are very prevalent and are in large-part due to lifestyle choices. The Pritikin Eating Plan provides plenty of food and hunger-curbing satisfaction. It is easy to follow, affordable, and helps reduce health risks.  Menu Workshop  Clinical staff conducted group or individual video education with verbal and written material and guidebook.  Patient learns that restaurant meals can sabotage health goals because they are often packed with calories, fat, sodium, and sugar. Recommendations include strategies to plan ahead and to communicate with the manager, chef, or server to help order a healthier meal.  Planning Your Eating Strategy  Clinical staff conducted group or individual video education with verbal and written material and guidebook.  Patient learns about the Pritikin Eating Plan and its benefit of reducing the risk of disease. The Pritikin Eating Plan does not focus on calories. Instead, it emphasizes high-quality, nutrient-rich foods. By knowing the characteristics of the foods, we choose, we can determine their calorie density and make informed decisions.  Targeting Your Nutrition Priorities  Clinical staff conducted group or individual video education with verbal and written material and guidebook.  Patient learns that lifestyle habits have a tremendous impact on disease risk and progression. This video provides eating and physical activity recommendations based on your personal health goals, such as reducing LDL cholesterol, losing weight, preventing or controlling type 2 diabetes, and reducing high blood pressure.  Vitamins and Minerals  Clinical staff conducted group or individual video education with verbal and written material and guidebook.  Patient learns different ways to obtain key vitamins and minerals, including through a recommended healthy diet. It is important to discuss all supplements you take with your  doctor.   Healthy Mind-Set    Smoking Cessation  Clinical staff conducted group or individual video education with verbal and written material and guidebook.  Patient learns that cigarette smoking and tobacco addiction pose a serious health risk which affects millions of people. Stopping smoking will significantly reduce the risk of heart disease, lung disease, and many forms of cancer. Recommended strategies for quitting are covered, including working with your doctor to develop a successful plan.  Culinary   Becoming a Set designer conducted group or individual video education with verbal and written material and guidebook.  Patient learns that cooking at home can be healthy, cost-effective, quick, and puts them in control. Keys to cooking healthy recipes will include looking at your recipe, assessing your equipment needs, planning ahead, making it simple, choosing cost-effective seasonal ingredients, and limiting the use of added fats, salts, and sugars.  Cooking - Breakfast and Snacks  Clinical  staff conducted group or individual video education with verbal and written material and guidebook.  Patient learns how important breakfast is to satiety and nutrition through the entire day. Recommendations include key foods to eat during breakfast to help stabilize blood sugar levels and to prevent overeating at meals later in the day. Planning ahead is also a key component.  Cooking - Educational psychologist conducted group or individual video education with verbal and written material and guidebook.  Patient learns eating strategies to improve overall health, including an approach to cook more at home. Recommendations include thinking of animal protein as a side on your plate rather than center stage and focusing instead on lower calorie dense options like vegetables, fruits, whole grains, and plant-based proteins, such as beans. Making sauces in large quantities to freeze  for later and leaving the skin on your vegetables are also recommended to maximize your experience.  Cooking - Healthy Salads and Dressing Clinical staff conducted group or individual video education with verbal and written material and guidebook.  Patient learns that vegetables, fruits, whole grains, and legumes are the foundations of the Pritikin Eating Plan. Recommendations include how to incorporate each of these in flavorful and healthy salads, and how to create homemade salad dressings. Proper handling of ingredients is also covered. Cooking - Soups and [employer] - Soups and Desserts Clinical staff conducted group or individual video education with verbal and written material and guidebook.  Patient learns that Pritikin soups and desserts make for easy, nutritious, and delicious snacks and meal components that are low in sodium, fat, sugar, and calorie density, while high in vitamins, minerals, and filling fiber. Recommendations include simple and healthy ideas for soups and desserts.   Overview     The Pritikin Solution Program Overview Clinical staff conducted group or individual video education with verbal and written material and guidebook.  Patient learns that the results of the Pritikin Program have been documented in more than 100 articles published in peer-reviewed journals, and the benefits include reducing risk factors for (and, in some cases, even reversing) high cholesterol, high blood pressure, type 2 diabetes, obesity, and more! An overview of the three key pillars of the Pritikin Program will be covered: eating well, doing regular exercise, and having a healthy mind-set.  WORKSHOPS  Exercise: Exercise Basics: Building Your Action Plan Clinical staff led group instruction and group discussion with PowerPoint presentation and patient guidebook. To enhance the learning environment the use of posters, models and videos may be added. At the conclusion of this workshop,  patients will comprehend the difference between physical activity and exercise, as well as the benefits of incorporating both, into their routine. Patients will understand the FITT (Frequency, Intensity, Time, and Type) principle and how to use it to build an exercise action plan. In addition, safety concerns and other considerations for exercise and cardiac rehab will be addressed by the presenter. The purpose of this lesson is to promote a comprehensive and effective weekly exercise routine in order to improve patients' overall level of fitness.   Managing Heart Disease: Your Path to a Healthier Heart Clinical staff led group instruction and group discussion with PowerPoint presentation and patient guidebook. To enhance the learning environment the use of posters, models and videos may be added.At the conclusion of this workshop, patients will understand the anatomy and physiology of the heart. Additionally, they will understand how Pritikin's three pillars impact the risk factors, the progression, and the management of heart  disease.  The purpose of this lesson is to provide a high-level overview of the heart, heart disease, and how the Pritikin lifestyle positively impacts risk factors.  Exercise Biomechanics Clinical staff led group instruction and group discussion with PowerPoint presentation and patient guidebook. To enhance the learning environment the use of posters, models and videos may be added. Patients will learn how the structural parts of their bodies function and how these functions impact their daily activities, movement, and exercise. Patients will learn how to promote a neutral spine, learn how to manage pain, and identify ways to improve their physical movement in order to promote healthy living. The purpose of this lesson is to expose patients to common physical limitations that impact physical activity. Participants will learn practical ways to adapt and manage aches and  pains, and to minimize their effect on regular exercise. Patients will learn how to maintain good posture while sitting, walking, and lifting.  Balance Training and Fall Prevention  Clinical staff led group instruction and group discussion with PowerPoint presentation and patient guidebook. To enhance the learning environment the use of posters, models and videos may be added. At the conclusion of this workshop, patients will understand the importance of their sensorimotor skills (vision, proprioception, and the vestibular system) in maintaining their ability to balance as they age. Patients will apply a variety of balancing exercises that are appropriate for their current level of function. Patients will understand the common causes for poor balance, possible solutions to these problems, and ways to modify their physical environment in order to minimize their fall risk. The purpose of this lesson is to teach patients about the importance of maintaining balance as they age and ways to minimize their risk of falling.  WORKSHOPS   Nutrition:  Fueling a Ship broker led group instruction and group discussion with PowerPoint presentation and patient guidebook. To enhance the learning environment the use of posters, models and videos may be added. Patients will review the foundational principles of the Pritikin Eating Plan and understand what constitutes a serving size in each of the food groups. Patients will also learn Pritikin-friendly foods that are better choices when away from home and review make-ahead meal and snack options. Calorie density will be reviewed and applied to three nutrition priorities: weight maintenance, weight loss, and weight gain. The purpose of this lesson is to reinforce (in a group setting) the key concepts around what patients are recommended to eat and how to apply these guidelines when away from home by planning and selecting Pritikin-friendly options.  Patients will understand how calorie density may be adjusted for different weight management goals.  Mindful Eating  Clinical staff led group instruction and group discussion with PowerPoint presentation and patient guidebook. To enhance the learning environment the use of posters, models and videos may be added. Patients will briefly review the concepts of the Pritikin Eating Plan and the importance of low-calorie dense foods. The concept of mindful eating will be introduced as well as the importance of paying attention to internal hunger signals. Triggers for non-hunger eating and techniques for dealing with triggers will be explored. The purpose of this lesson is to provide patients with the opportunity to review the basic principles of the Pritikin Eating Plan, discuss the value of eating mindfully and how to measure internal cues of hunger and fullness using the Hunger Scale. Patients will also discuss reasons for non-hunger eating and learn strategies to use for controlling emotional eating.  Targeting Your Nutrition Priorities  Clinical staff led group instruction and group discussion with PowerPoint presentation and patient guidebook. To enhance the learning environment the use of posters, models and videos may be added. Patients will learn how to determine their genetic susceptibility to disease by reviewing their family history. Patients will gain insight into the importance of diet as part of an overall healthy lifestyle in mitigating the impact of genetics and other environmental insults. The purpose of this lesson is to provide patients with the opportunity to assess their personal nutrition priorities by looking at their family history, their own health history and current risk factors. Patients will also be able to discuss ways of prioritizing and modifying the Pritikin Eating Plan for their highest risk areas  Menu  Clinical staff led group instruction and group discussion with PowerPoint  presentation and patient guidebook. To enhance the learning environment the use of posters, models and videos may be added. Using menus brought in from [employer], or printed from [employer], patients will apply the Pritikin dining out guidelines that were presented in the [employer] video. Patients will also be able to practice these guidelines in a variety of provided scenarios. The purpose of this lesson is to provide patients with the opportunity to practice hands-on learning of the Pritikin Dining Out guidelines with actual menus and practice scenarios.  Label Reading Clinical staff led group instruction and group discussion with PowerPoint presentation and patient guidebook. To enhance the learning environment the use of posters, models and videos may be added. Patients will review and discuss the Pritikin label reading guidelines presented in Pritikin's Label Reading Educational series video. Using fool labels brought in from local grocery stores and markets, patients will apply the label reading guidelines and determine if the packaged food meet the Pritikin guidelines. The purpose of this lesson is to provide patients with the opportunity to review, discuss, and practice hands-on learning of the Pritikin Label Reading guidelines with actual packaged food labels. Cooking School  Pritikin's [employer] are designed to teach patients ways to prepare quick, simple, and affordable recipes at home. The importance of nutrition's role in chronic disease risk reduction is reflected in its emphasis in the overall Pritikin program. By learning how to prepare essential core Pritikin Eating Plan recipes, patients will increase control over what they eat; be able to customize the flavor of foods without the use of added salt, sugar, or fat; and improve the quality of the food they consume. By learning a set of core recipes which are easily assembled, quickly prepared, and  affordable, patients are more likely to prepare more healthy foods at home. These workshops focus on convenient breakfasts, simple entres, side dishes, and desserts which can be prepared with minimal effort and are consistent with nutrition recommendations for cardiovascular risk reduction. Cooking [employer] are taught by a Armed forces logistics/support/administrative officer (RD) who has been trained by the [employer]. The chef or RD has a clear understanding of the importance of minimizing - if not completely eliminating - added fat, sugar, and sodium in recipes. Throughout the series of Cooking School Workshop sessions, patients will learn about healthy ingredients and efficient methods of cooking to build confidence in their capability to prepare    Cooking School weekly topics:  Adding Flavor- Sodium-Free  Fast and Healthy Breakfasts  Powerhouse Plant-Based Proteins  Satisfying Salads and Dressings  Simple Sides and Sauces  International Cuisine-Spotlight on the [employer] Desserts  [employer]  Efficiency Cooking - Meals in a Snap  Tasty Appetizers and Snacks  Comforting Weekend Breakfasts  One-Pot Wonders   Fast Evening Meals  Landscape architect Your Pritikin Plate  WORKSHOPS   Healthy Mindset (Psychosocial):  Focused Goals, Sustainable Changes Clinical staff led group instruction and group discussion with PowerPoint presentation and patient guidebook. To enhance the learning environment the use of posters, models and videos may be added. Patients will be able to apply effective goal setting strategies to establish at least one personal goal, and then take consistent, meaningful action toward that goal. They will learn to identify common barriers to achieving personal goals and develop strategies to overcome them. Patients will also gain an understanding of how our mind-set can impact our ability to achieve goals and the importance of cultivating a positive  and growth-oriented mind-set. The purpose of this lesson is to provide patients with a deeper understanding of how to set and achieve personal goals, as well as the tools and strategies needed to overcome common obstacles which may arise along the way.  From Head to Heart: The Power of a Healthy Outlook  Clinical staff led group instruction and group discussion with PowerPoint presentation and patient guidebook. To enhance the learning environment the use of posters, models and videos may be added. Patients will be able to recognize and describe the impact of emotions and mood on physical health. They will discover the importance of self-care and explore self-care practices which may work for them. Patients will also learn how to utilize the 4 C's to cultivate a healthier outlook and better manage stress and challenges. The purpose of this lesson is to demonstrate to patients how a healthy outlook is an essential part of maintaining good health, especially as they continue their cardiac rehab journey.  Healthy Sleep for a Healthy Heart Clinical staff led group instruction and group discussion with PowerPoint presentation and patient guidebook. To enhance the learning environment the use of posters, models and videos may be added. At the conclusion of this workshop, patients will be able to demonstrate knowledge of the importance of sleep to overall health, well-being, and quality of life. They will understand the symptoms of, and treatments for, common sleep disorders. Patients will also be able to identify daytime and nighttime behaviors which impact sleep, and they will be able to apply these tools to help manage sleep-related challenges. The purpose of this lesson is to provide patients with a general overview of sleep and outline the importance of quality sleep. Patients will learn about a few of the most common sleep disorders. Patients will also be introduced to the concept of "sleep hygiene," and  discover ways to self-manage certain sleeping problems through simple daily behavior changes. Finally, the workshop will motivate patients by clarifying the links between quality sleep and their goals of heart-healthy living.   Recognizing and Reducing Stress Clinical staff led group instruction and group discussion with PowerPoint presentation and patient guidebook. To enhance the learning environment the use of posters, models and videos may be added. At the conclusion of this workshop, patients will be able to understand the types of stress reactions, differentiate between acute and chronic stress, and recognize the impact that chronic stress has on their health. They will also be able to apply different coping mechanisms, such as reframing negative self-talk. Patients will have the opportunity to practice a variety of stress management techniques, such as deep abdominal breathing, progressive muscle relaxation, and/or guided imagery.  The purpose of  this lesson is to educate patients on the role of stress in their lives and to provide healthy techniques for coping with it.  Learning Barriers/Preferences:  Learning Barriers/Preferences - 02/26/23 1215       Learning Barriers/Preferences   Learning Barriers Sight   wears contacts   Learning Preferences Video;Pictoral             Education Topics:  Knowledge Questionnaire Score:  Knowledge Questionnaire Score - 02/26/23 0848       Knowledge Questionnaire Score   Pre Score 19/24             Core Components/Risk Factors/Patient Goals at Admission:  Personal Goals and Risk Factors at Admission - 02/26/23 0838       Core Components/Risk Factors/Patient Goals on Admission   Tobacco Cessation Yes    Intervention Offer self-teaching materials, assist with locating and accessing local/national Quit Smoking programs, and support quit date choice.    Expected Outcomes Long Term: Complete abstinence from all tobacco products for at  least 12 months from quit date.;Short Term: Will quit all tobacco product use, adhering to prevention of relapse plan.    Lipids Yes    Intervention Provide education and support for participant on nutrition & aerobic/resistive exercise along with prescribed medications to achieve LDL 70mg , HDL >40mg .    Expected Outcomes Short Term: Participant states understanding of desired cholesterol values and is compliant with medications prescribed. Participant is following exercise prescription and nutrition guidelines.;Long Term: Cholesterol controlled with medications as prescribed, with individualized exercise RX and with personalized nutrition plan. Value goals: LDL < 70mg , HDL > 40 mg.             Core Components/Risk Factors/Patient Goals Review:    Core Components/Risk Factors/Patient Goals at Discharge (Final Review):    ITP Comments:  ITP Comments     Row Name 02/26/23 0815           ITP Comments Medical Director- Dr. Armanda Magic, MD. Introduction to the Pritikin Education Program / Intensive Cardiac Rehab. Review of initial orientation folder.                Comments: Tyair attended orientation for the cardiac rehabilitation program on  02/26/2023  to perform initial intake and exercise walk test. He was introduced to the [employer] education and orientation packet was reviewed. Completed 6-minute walk test, measurements, initial ITP, and exercise prescription. Vital signs stable. Telemetry-normal sinus rhythm, asymptomatic.   Service time was from 815 to 950. Artist Pais, MS, ACSM CEP 02/26/2023 1026

## 2023-02-26 NOTE — Progress Notes (Signed)
Cardiac Rehab Medication Review by a Pharmacist  Does the patient  feel that his/her medications are working for him/her?  yes  Has the patient been experiencing any side effects to the medications prescribed?  no  Does the patient measure his/her own blood pressure or blood glucose at home?  no   Does the patient have any problems obtaining medications due to transportation or finances?   no  Understanding of regimen: excellent Understanding of indications: excellent Potential of compliance: excellent    Nurse comments: Ebert is taking his medications as prescribed and has a good understanding of what his medications are for. Vaun has a BP cuff/ monitor he does not check his blood pressure daily.    Arta Bruce [facility] RN 02/26/2023 8:54 AM

## 2023-03-04 ENCOUNTER — Encounter (HOSPITAL_COMMUNITY)
Admission: RE | Admit: 2023-03-04 | Discharge: 2023-03-04 | Disposition: A | Discharge status: Home / Self Care | Payer: 59 | Source: Ambulatory Visit | Attending: Cardiovascular Disease | Admitting: Cardiovascular Disease

## 2023-03-04 ENCOUNTER — Other Ambulatory Visit: Payer: Self-pay | Admitting: Medical Genetics

## 2023-03-04 DIAGNOSIS — Z9889 Other specified postprocedural states: Secondary | ICD-10-CM

## 2023-03-04 DIAGNOSIS — Z006 Encounter for examination for normal comparison and control in clinical research program: Secondary | ICD-10-CM

## 2023-03-04 NOTE — Progress Notes (Signed)
Cardiac Individual Treatment Plan  Patient Details  Name: Christopher Tanner MRN: [id number] Date of Birth: [birth year] Referring Provider:   Flowsheet Row INTENSIVE CARDIAC REHAB ORIENT from 02/26/2023 in [facility]  Referring Provider Nahser, Deloris Ping, MD.       Initial Encounter Date:  Flowsheet Row INTENSIVE CARDIAC REHAB ORIENT from 02/26/2023 in [facility]  Date 02/26/23       Visit Diagnosis: 01/23/23 MVR (mitral valve repair) at the [facility]  Patient's Home Medications on Admission:  Current Outpatient Medications:    acetaminophen (TYLENOL) 325 MG tablet, as needed., Disp: , Rfl:    amoxicillin (AMOXIL) 500 MG capsule, Take 4 capsules by mouth one hour prior to dental cleanings/procedures, Disp: 16 capsule, Rfl: 4   aspirin 81 MG tablet, Take 81 mg by mouth daily., Disp: , Rfl:    carboxymethylcellulose (REFRESH PLUS) 0.5 % SOLN, Place 1 drop into both eyes 2 (two) times daily., Disp: , Rfl:    ibuprofen (ADVIL) 200 MG tablet, as needed., Disp: , Rfl:    metoprolol tartrate (LOPRESSOR) 25 MG tablet, Take 0.5 tablets (12.5 mg total) by mouth 2 (two) times daily., Disp: 90 tablet, Rfl: 3   Multiple Vitamins-Minerals (MULTIVITAL PERFORMANCE) TABS, Take 1 tablet by mouth daily., Disp: , Rfl:    psyllium (METAMUCIL SMOOTH TEXTURE) 28 % packet, Take 1 packet by mouth at bedtime., Disp: , Rfl:    rosuvastatin (CRESTOR) 5 MG tablet, TAKE 1 TABLET (5 MG TOTAL) BY MOUTH DAILY. (Patient taking differently: Take 5 mg by mouth every other day.), Disp: 30 tablet, Rfl: 0   sildenafil (REVATIO) 20 MG tablet, daily as needed., Disp: , Rfl:   Current Facility-Administered Medications:    0.9 %  sodium chloride infusion, 500 mL, Intravenous, Once, Meryl Dare, MD  Past Medical History: Past Medical History:  Diagnosis Date   Anal fistula    Hyperlipidemia    on  meds   Mitral valve prolapse    Last echocardiogram July, 2008. Moderate MVP of the posterior leaflet. Moderate mitral regurgitation. Normal left ventricular systolic function.   Numbness in feet    Plantar fasciitis    right    Tobacco Use: Social History   Tobacco Use  Smoking Status Light Smoker   Types: Cigars  Smokeless Tobacco Never  Tobacco Comments   Occasional,  " smokes cigars at weddings    Labs: Review Flowsheet  More data exists      Latest Ref Rng & Units 11/22/2017 02/24/2019 06/03/2019 08/19/2020 10/16/2021  Labs for ITP Cardiac and Pulmonary Rehab  Cholestrol 100 - 199 mg/dL 347  425  956  387  564   LDL (calc) 0 - 99 mg/dL 95  332  92  87  90   HDL-C >39 mg/dL 66  61  62  65  66   Trlycerides 0 - 149 mg/dL 951  884  99  91  166     Details            Capillary Blood Glucose: No results found for: "GLUCAP"   Exercise Target Goals: Exercise Program Goal: Individual exercise prescription set using results from initial 6 min walk test and THRR while considering  patient's activity barriers and safety.   Exercise Prescription Goal: Initial exercise prescription builds to 30-45 minutes a day of aerobic activity, 2-3 days per week.  Home exercise guidelines  will be given to patient during program as part of exercise prescription that the participant will acknowledge.  Activity Barriers & Risk Stratification:  Activity Barriers & Cardiac Risk Stratification - 02/26/23 0901       Activity Barriers & Cardiac Risk Stratification   Activity Barriers Other (comment)    Comments Numbness- both feet, plantar fasciitis- right foot, h/x fx x4 - left hand, left knee arthroscopy/ meniscectomy, left shoulder arthroscopy/ biceps tendon repair.    Cardiac Risk Stratification Low             6 Minute Walk:  6 Minute Walk     Row Name 02/26/23 0930         6 Minute Walk   Phase Initial     Distance 1805 feet     Walk Time 6 minutes     # of Rest Breaks  0     MPH 3.42     METS 4.17     RPE 11     Perceived Dyspnea  0     VO2 Peak 14.61     Symptoms No     Resting HR 67 bpm     Resting BP 102/70     Resting Oxygen Saturation  98 %     Exercise Oxygen Saturation  during 6 min walk 97 %     Max Ex. HR 79 bpm     Max Ex. BP 140/78     2 Minute Post BP 116/78              Oxygen Initial Assessment:   Oxygen Re-Evaluation:   Oxygen Discharge (Final Oxygen Re-Evaluation):   Initial Exercise Prescription:  Initial Exercise Prescription - 02/26/23 1000       Date of Initial Exercise RX and Referring Provider   Date 02/26/23    Referring Provider Nahser, Deloris Ping, MD.    Expected Discharge Date 05/22/23      Bike   Level 3    Watts 60    Minutes 15    METs 4.2      Elliptical   Level 1    Speed 1    Minutes 15    METs 4.2      Prescription Details   Frequency (times per week) 3    Duration Progress to 30 minutes of continuous aerobic without signs/symptoms of physical distress      Intensity   THRR 40-80% of Max Heartrate 62-123    Ratings of Perceived Exertion 11-13    Perceived Dyspnea 0-4      Progression   Progression Continue to progress workloads to maintain intensity without signs/symptoms of physical distress.      Resistance Training   Training Prescription Yes    Weight 5 lbs    Reps 10-15             Perform Capillary Blood Glucose checks as needed.  Exercise Prescription Changes:   Exercise Comments:   Exercise Goals and Review:   Exercise Goals     Row Name 02/26/23 0903             Exercise Goals   Increase Physical Activity Yes       Intervention Provide advice, education, support and counseling about physical activity/exercise needs.;Develop an individualized exercise prescription for aerobic and resistive training based on initial evaluation findings, risk stratification, comorbidities and participant's personal goals.       Expected Outcomes Short Term: Attend  rehab on a regular  basis to increase amount of physical activity.;Long Term: Add in home exercise to make exercise part of routine and to increase amount of physical activity.;Long Term: Exercising regularly at least 3-5 days a week.       Increase Strength and Stamina Yes       Intervention Provide advice, education, support and counseling about physical activity/exercise needs.;Develop an individualized exercise prescription for aerobic and resistive training based on initial evaluation findings, risk stratification, comorbidities and participant's personal goals.       Expected Outcomes Short Term: Increase workloads from initial exercise prescription for resistance, speed, and METs.;Short Term: Perform resistance training exercises routinely during rehab and add in resistance training at home;Long Term: Improve cardiorespiratory fitness, muscular endurance and strength as measured by increased METs and functional capacity ( )       Able to understand and use rate of perceived exertion (RPE) scale Yes       Intervention Provide education and explanation on how to use RPE scale       Expected Outcomes Short Term: Able to use RPE daily in rehab to express subjective intensity level;Long Term:  Able to use RPE to guide intensity level when exercising independently       Knowledge and understanding of Target Heart Rate Range (THRR) Yes       Intervention Provide education and explanation of THRR including how the numbers were predicted and where they are located for reference       Expected Outcomes Short Term: Able to state/look up THRR;Long Term: Able to use THRR to govern intensity when exercising independently;Short Term: Able to use daily as guideline for intensity in rehab       Able to check pulse independently Yes       Intervention Provide education and demonstration on how to check pulse in carotid and radial arteries.;Review the importance of being able to check your own pulse for safety  during independent exercise       Expected Outcomes Short Term: Able to explain why pulse checking is important during independent exercise;Long Term: Able to check pulse independently and accurately       Understanding of Exercise Prescription Yes       Intervention Provide education, explanation, and written materials on patient's individual exercise prescription       Expected Outcomes Short Term: Able to explain program exercise prescription;Long Term: Able to explain home exercise prescription to exercise independently                Exercise Goals Re-Evaluation :   Discharge Exercise Prescription (Final Exercise Prescription Changes):   Nutrition:  Target Goals: Understanding of nutrition guidelines, daily intake of sodium 1500mg , cholesterol 200mg , calories 30% from fat and 7% or less from saturated fats, daily to have 5 or more servings of fruits and vegetables.  Biometrics:  Pre Biometrics - 02/26/23 0815       Pre Biometrics   Waist Circumference 40.25 inches    Hip Circumference 41.75 inches    Waist to Hip Ratio 0.96 %    Triceps Skinfold 12 mm    % Body Fat 25 %    Grip Strength 46 kg    Flexibility 8 in    Single Leg Stand 30 seconds              Nutrition Therapy Plan and Nutrition Goals:   Nutrition Assessments:  MEDIFICTS Score Key: ?70 Need to make dietary changes  40-70 Heart Healthy Diet ? 40 Therapeutic  Level Cholesterol Diet    Picture Your Plate Scores: <47 Unhealthy dietary pattern with much room for improvement. 41-50 Dietary pattern unlikely to meet recommendations for good health and room for improvement. 51-60 More healthful dietary pattern, with some room for improvement.  >60 Healthy dietary pattern, although there may be some specific behaviors that could be improved.    Nutrition Goals Re-Evaluation:   Nutrition Goals Re-Evaluation:   Nutrition Goals Discharge (Final Nutrition Goals  Re-Evaluation):   Psychosocial: Target Goals: Acknowledge presence or absence of significant depression and/or stress, maximize coping skills, provide positive support system. Participant is able to verbalize types and ability to use techniques and skills needed for reducing stress and depression.  Initial Review & Psychosocial Screening:  Initial Psych Review & Screening - 02/26/23 0936       Initial Review   Current issues with None Identified      Family Dynamics   Good Support System? Yes   Frutoso has his wife for support     Barriers   Psychosocial barriers to participate in program There are no identifiable barriers or psychosocial needs.      Screening Interventions   Interventions Encouraged to exercise             Quality of Life Scores:  Quality of Life - 02/26/23 0859       Quality of Life   Select Quality of Life      Quality of Life Scores   Health/Function Pre 28.7 %    Socioeconomic Pre 28.75 %    Psych/Spiritual Pre 28.93 %    Family Pre 30 %    GLOBAL Pre 28.94 %            Scores of 19 and below usually indicate a poorer quality of life in these areas.  A difference of  2-3 points is a clinically meaningful difference.  A difference of 2-3 points in the total score of the Quality of Life Index has been associated with significant improvement in overall quality of life, self-image, physical symptoms, and general health in studies assessing change in quality of life.  PHQ-9: Review Flowsheet       02/26/2023  Depression screen PHQ 2/9  Decreased Interest 1  Down, Depressed, Hopeless 0  PHQ - 2 Score 1  Altered sleeping 1  Tired, decreased energy 1  Change in appetite 0  Feeling bad or failure about yourself  0  Trouble concentrating 0  Moving slowly or fidgety/restless 0  Suicidal thoughts 0  PHQ-9 Score 3  Difficult doing work/chores Not difficult at all    Details           Interpretation of Total Score  Total Score Depression  Severity:  1-4 = Minimal depression, 5-9 = Mild depression, 10-14 = Moderate depression, 15-19 = Moderately severe depression, 20-27 = Severe depression   Psychosocial Evaluation and Intervention:   Psychosocial Re-Evaluation:   Psychosocial Discharge (Final Psychosocial Re-Evaluation):   Vocational Rehabilitation: Provide vocational rehab assistance to qualifying candidates.   Vocational Rehab Evaluation & Intervention:  Vocational Rehab - 02/26/23 8295       Initial Vocational Rehab Evaluation & Intervention   Assessment shows need for Vocational Rehabilitation No   Lavoy is retired and does not need vocational rehab at this time            Education: Education Goals: Education classes will be provided on a weekly basis, covering required topics. Participant will state understanding/return demonstration of topics  presented.     Core Videos: Exercise    Move It!  Clinical staff conducted group or individual video education with verbal and written material and guidebook.  Patient learns the recommended Pritikin exercise program. Exercise with the goal of living a long, healthy life. Some of the health benefits of exercise include controlled diabetes, healthier blood pressure levels, improved cholesterol levels, improved heart and lung capacity, improved sleep, and better body composition. Everyone should speak with their doctor before starting or changing an exercise routine.  Biomechanical Limitations Clinical staff conducted group or individual video education with verbal and written material and guidebook.  Patient learns how biomechanical limitations can impact exercise and how we can mitigate and possibly overcome limitations to have an impactful and balanced exercise routine.  Body Composition Clinical staff conducted group or individual video education with verbal and written material and guidebook.  Patient learns that body composition (ratio of muscle mass to fat  mass) is a key component to assessing overall fitness, rather than body weight alone. Increased fat mass, especially visceral belly fat, can put [place] at increased risk for metabolic syndrome, type 2 diabetes, heart disease, and even death. It is recommended to combine diet and exercise (cardiovascular and resistance training) to improve your body composition. Seek guidance from your physician and exercise physiologist before implementing an exercise routine.  Exercise Action Plan Clinical staff conducted group or individual video education with verbal and written material and guidebook.  Patient learns the recommended strategies to achieve and enjoy long-term exercise adherence, including variety, self-motivation, self-efficacy, and positive decision making. Benefits of exercise include fitness, good health, weight management, more energy, better sleep, less stress, and overall well-being.  Medical   Heart Disease Risk Reduction Clinical staff conducted group or individual video education with verbal and written material and guidebook.  Patient learns our heart is our most vital organ as it circulates oxygen, nutrients, white blood cells, and hormones throughout the entire body, and carries waste away. Data supports a plant-based eating plan like the Pritikin Program for its effectiveness in slowing progression of and reversing heart disease. The video provides a number of recommendations to address heart disease.   Metabolic Syndrome and Belly Fat  Clinical staff conducted group or individual video education with verbal and written material and guidebook.  Patient learns what metabolic syndrome is, how it leads to heart disease, and how one can reverse it and keep it from coming back. You have metabolic syndrome if you have 3 of the following 5 criteria: abdominal obesity, high blood pressure, high triglycerides, low HDL cholesterol, and high blood sugar.  Hypertension and Heart Disease Clinical staff  conducted group or individual video education with verbal and written material and guidebook.  Patient learns that high blood pressure, or hypertension, is very common in the [place]. Hypertension is largely due to excessive salt intake, but other important risk factors include being overweight, physical inactivity, drinking too much alcohol, smoking, and not eating enough potassium from fruits and vegetables. High blood pressure is a leading risk factor for heart attack, stroke, congestive heart failure, dementia, kidney failure, and premature death. Long-term effects of excessive salt intake include stiffening of the arteries and thickening of heart muscle and organ damage. Recommendations include ways to reduce hypertension and the risk of heart disease.  Diseases of Our Time - Focusing on Diabetes Clinical staff conducted group or individual video education with verbal and written material and guidebook.  Patient learns why the best way to  stop diseases of our time is prevention, through food and other lifestyle changes. Medicine (such as prescription pills and surgeries) is often only a Band-Aid on the problem, not a long-term solution. Most common diseases of our time include obesity, type 2 diabetes, hypertension, heart disease, and cancer. The Pritikin Program is recommended and has been proven to help reduce, reverse, and/or prevent the damaging effects of metabolic syndrome.  Nutrition   Overview of the Pritikin Eating Plan  Clinical staff conducted group or individual video education with verbal and written material and guidebook.  Patient learns about the Pritikin Eating Plan for disease risk reduction. The Pritikin Eating Plan emphasizes a wide variety of unrefined, minimally-processed carbohydrates, like fruits, vegetables, whole grains, and legumes. Go, Caution, and Stop food choices are explained. Plant-based and lean animal proteins are emphasized. Rationale provided for low sodium  intake for blood pressure control, low added sugars for blood sugar stabilization, and low added fats and oils for coronary artery disease risk reduction and weight management.  Calorie Density  Clinical staff conducted group or individual video education with verbal and written material and guidebook.  Patient learns about calorie density and how it impacts the Pritikin Eating Plan. Knowing the characteristics of the food you choose will help you decide whether those foods will lead to weight gain or weight loss, and whether you want to consume more or less of them. Weight loss is usually a side effect of the Pritikin Eating Plan because of its focus on low calorie-dense foods.  Label Reading  Clinical staff conducted group or individual video education with verbal and written material and guidebook.  Patient learns about the Pritikin recommended label reading guidelines and corresponding recommendations regarding calorie density, added sugars, sodium content, and whole grains.  Dining Out - Part 1  Clinical staff conducted group or individual video education with verbal and written material and guidebook.  Patient learns that restaurant meals can be sabotaging because they can be so high in calories, fat, sodium, and/or sugar. Patient learns recommended strategies on how to positively address this and avoid unhealthy pitfalls.  Facts on Fats  Clinical staff conducted group or individual video education with verbal and written material and guidebook.  Patient learns that lifestyle modifications can be just as effective, if not more so, as many medications for lowering your risk of heart disease. A Pritikin lifestyle can help to reduce your risk of inflammation and atherosclerosis (cholesterol build-up, or plaque, in the artery walls). Lifestyle interventions such as dietary choices and physical activity address the cause of atherosclerosis. A review of the types of fats and their impact on blood  cholesterol levels, along with dietary recommendations to reduce fat intake is also included.  Nutrition Action Plan  Clinical staff conducted group or individual video education with verbal and written material and guidebook.  Patient learns how to incorporate Pritikin recommendations into their lifestyle. Recommendations include planning and keeping personal health goals in mind as an important part of their success.  Healthy Mind-Set    Healthy Minds, Bodies, Hearts  Clinical staff conducted group or individual video education with verbal and written material and guidebook.  Patient learns how to identify when they are stressed. Video will discuss the impact of that stress, as well as the many benefits of stress management. Patient will also be introduced to stress management techniques. The way we think, act, and feel has an impact on our hearts.  How Our Thoughts Can Heal Our Hearts  Clinical staff  conducted group or individual video education with verbal and written material and guidebook.  Patient learns that negative thoughts can cause depression and anxiety. This can result in negative lifestyle behavior and serious health problems. Cognitive behavioral therapy is an effective method to help control our thoughts in order to change and improve our emotional outlook.  Additional Videos:  Exercise    Improving Performance  Clinical staff conducted group or individual video education with verbal and written material and guidebook.  Patient learns to use a non-linear approach by alternating intensity levels and lengths of time spent exercising to help burn more calories and lose more body fat. Cardiovascular exercise helps improve heart health, metabolism, hormonal balance, blood sugar control, and recovery from fatigue. Resistance training improves strength, endurance, balance, coordination, reaction time, metabolism, and muscle mass. Flexibility exercise improves circulation, posture, and  balance. Seek guidance from your physician and exercise physiologist before implementing an exercise routine and learn your capabilities and proper form for all exercise.  Introduction to Yoga  Clinical staff conducted group or individual video education with verbal and written material and guidebook.  Patient learns about yoga, a discipline of the coming together of mind, breath, and body. The benefits of yoga include improved flexibility, improved range of motion, better posture and core strength, increased lung function, weight loss, and positive self-image. Yoga's heart health benefits include lowered blood pressure, healthier heart rate, decreased cholesterol and triglyceride levels, improved immune function, and reduced stress. Seek guidance from your physician and exercise physiologist before implementing an exercise routine and learn your capabilities and proper form for all exercise.  Medical   Aging: Enhancing Your Quality of Life  Clinical staff conducted group or individual video education with verbal and written material and guidebook.  Patient learns key strategies and recommendations to stay in good physical health and enhance quality of life, such as prevention strategies, having an advocate, securing a Health Care Proxy and Power of Attorney, and keeping a list of medications and system for tracking them. It also discusses how to avoid risk for bone loss.  Biology of Weight Control  Clinical staff conducted group or individual video education with verbal and written material and guidebook.  Patient learns that weight gain occurs because we consume more calories than we burn (eating more, moving less). Even if your body weight is normal, you may have higher ratios of fat compared to muscle mass. Too much body fat puts you at increased risk for cardiovascular disease, heart attack, stroke, type 2 diabetes, and obesity-related cancers. In addition to exercise, following the Pritikin Eating  Plan can help reduce your risk.  Decoding Lab Results  Clinical staff conducted group or individual video education with verbal and written material and guidebook.  Patient learns that lab test reflects one measurement whose values change over time and are influenced by many factors, including medication, stress, sleep, exercise, food, hydration, pre-existing medical conditions, and more. It is recommended to use the knowledge from this video to become more involved with your lab results and evaluate your numbers to speak with your doctor.   Diseases of Our Time - Overview  Clinical staff conducted group or individual video education with verbal and written material and guidebook.  Patient learns that according to the CDC, 50% to 70% of chronic diseases (such as obesity, type 2 diabetes, elevated lipids, hypertension, and heart disease) are avoidable through lifestyle improvements including healthier food choices, listening to satiety cues, and increased physical activity.  Sleep Disorders Clinical  staff conducted group or individual video education with verbal and written material and guidebook.  Patient learns how good quality and duration of sleep are important to overall health and well-being. Patient also learns about sleep disorders and how they impact health along with recommendations to address them, including discussing with a physician.  Nutrition  Dining Out - Part 2 Clinical staff conducted group or individual video education with verbal and written material and guidebook.  Patient learns how to plan ahead and communicate in order to maximize their dining experience in a healthy and nutritious manner. Included are recommended food choices based on the type of restaurant the patient is visiting.   Fueling a Banker conducted group or individual video education with verbal and written material and guidebook.  There is a strong connection between our food choices  and our health. Diseases like obesity and type 2 diabetes are very prevalent and are in large-part due to lifestyle choices. The Pritikin Eating Plan provides plenty of food and hunger-curbing satisfaction. It is easy to follow, affordable, and helps reduce health risks.  Menu Workshop  Clinical staff conducted group or individual video education with verbal and written material and guidebook.  Patient learns that restaurant meals can sabotage health goals because they are often packed with calories, fat, sodium, and sugar. Recommendations include strategies to plan ahead and to communicate with the manager, chef, or server to help order a healthier meal.  Planning Your Eating Strategy  Clinical staff conducted group or individual video education with verbal and written material and guidebook.  Patient learns about the Pritikin Eating Plan and its benefit of reducing the risk of disease. The Pritikin Eating Plan does not focus on calories. Instead, it emphasizes high-quality, nutrient-rich foods. By knowing the characteristics of the foods, we choose, we can determine their calorie density and make informed decisions.  Targeting Your Nutrition Priorities  Clinical staff conducted group or individual video education with verbal and written material and guidebook.  Patient learns that lifestyle habits have a tremendous impact on disease risk and progression. This video provides eating and physical activity recommendations based on your personal health goals, such as reducing LDL cholesterol, losing weight, preventing or controlling type 2 diabetes, and reducing high blood pressure.  Vitamins and Minerals  Clinical staff conducted group or individual video education with verbal and written material and guidebook.  Patient learns different ways to obtain key vitamins and minerals, including through a recommended healthy diet. It is important to discuss all supplements you take with your  doctor.   Healthy Mind-Set    Smoking Cessation  Clinical staff conducted group or individual video education with verbal and written material and guidebook.  Patient learns that cigarette smoking and tobacco addiction pose a serious health risk which affects millions of people. Stopping smoking will significantly reduce the risk of heart disease, lung disease, and many forms of cancer. Recommended strategies for quitting are covered, including working with your doctor to develop a successful plan.  Culinary   Becoming a Set designer conducted group or individual video education with verbal and written material and guidebook.  Patient learns that cooking at home can be healthy, cost-effective, quick, and puts them in control. Keys to cooking healthy recipes will include looking at your recipe, assessing your equipment needs, planning ahead, making it simple, choosing cost-effective seasonal ingredients, and limiting the use of added fats, salts, and sugars.  Cooking - Breakfast and Snacks  Clinical  staff conducted group or individual video education with verbal and written material and guidebook.  Patient learns how important breakfast is to satiety and nutrition through the entire day. Recommendations include key foods to eat during breakfast to help stabilize blood sugar levels and to prevent overeating at meals later in the day. Planning ahead is also a key component.  Cooking - Educational psychologist conducted group or individual video education with verbal and written material and guidebook.  Patient learns eating strategies to improve overall health, including an approach to cook more at home. Recommendations include thinking of animal protein as a side on your plate rather than center stage and focusing instead on lower calorie dense options like vegetables, fruits, whole grains, and plant-based proteins, such as beans. Making sauces in large quantities to freeze  for later and leaving the skin on your vegetables are also recommended to maximize your experience.  Cooking - Healthy Salads and Dressing Clinical staff conducted group or individual video education with verbal and written material and guidebook.  Patient learns that vegetables, fruits, whole grains, and legumes are the foundations of the Pritikin Eating Plan. Recommendations include how to incorporate each of these in flavorful and healthy salads, and how to create homemade salad dressings. Proper handling of ingredients is also covered. Cooking - Soups and [employer] - Soups and Desserts Clinical staff conducted group or individual video education with verbal and written material and guidebook.  Patient learns that Pritikin soups and desserts make for easy, nutritious, and delicious snacks and meal components that are low in sodium, fat, sugar, and calorie density, while high in vitamins, minerals, and filling fiber. Recommendations include simple and healthy ideas for soups and desserts.   Overview     The Pritikin Solution Program Overview Clinical staff conducted group or individual video education with verbal and written material and guidebook.  Patient learns that the results of the Pritikin Program have been documented in more than 100 articles published in peer-reviewed journals, and the benefits include reducing risk factors for (and, in some cases, even reversing) high cholesterol, high blood pressure, type 2 diabetes, obesity, and more! An overview of the three key pillars of the Pritikin Program will be covered: eating well, doing regular exercise, and having a healthy mind-set.  WORKSHOPS  Exercise: Exercise Basics: Building Your Action Plan Clinical staff led group instruction and group discussion with PowerPoint presentation and patient guidebook. To enhance the learning environment the use of posters, models and videos may be added. At the conclusion of this workshop,  patients will comprehend the difference between physical activity and exercise, as well as the benefits of incorporating both, into their routine. Patients will understand the FITT (Frequency, Intensity, Time, and Type) principle and how to use it to build an exercise action plan. In addition, safety concerns and other considerations for exercise and cardiac rehab will be addressed by the presenter. The purpose of this lesson is to promote a comprehensive and effective weekly exercise routine in order to improve patients' overall level of fitness.   Managing Heart Disease: Your Path to a Healthier Heart Clinical staff led group instruction and group discussion with PowerPoint presentation and patient guidebook. To enhance the learning environment the use of posters, models and videos may be added.At the conclusion of this workshop, patients will understand the anatomy and physiology of the heart. Additionally, they will understand how Pritikin's three pillars impact the risk factors, the progression, and the management of heart  disease.  The purpose of this lesson is to provide a high-level overview of the heart, heart disease, and how the Pritikin lifestyle positively impacts risk factors.  Exercise Biomechanics Clinical staff led group instruction and group discussion with PowerPoint presentation and patient guidebook. To enhance the learning environment the use of posters, models and videos may be added. Patients will learn how the structural parts of their bodies function and how these functions impact their daily activities, movement, and exercise. Patients will learn how to promote a neutral spine, learn how to manage pain, and identify ways to improve their physical movement in order to promote healthy living. The purpose of this lesson is to expose patients to common physical limitations that impact physical activity. Participants will learn practical ways to adapt and manage aches and  pains, and to minimize their effect on regular exercise. Patients will learn how to maintain good posture while sitting, walking, and lifting.  Balance Training and Fall Prevention  Clinical staff led group instruction and group discussion with PowerPoint presentation and patient guidebook. To enhance the learning environment the use of posters, models and videos may be added. At the conclusion of this workshop, patients will understand the importance of their sensorimotor skills (vision, proprioception, and the vestibular system) in maintaining their ability to balance as they age. Patients will apply a variety of balancing exercises that are appropriate for their current level of function. Patients will understand the common causes for poor balance, possible solutions to these problems, and ways to modify their physical environment in order to minimize their fall risk. The purpose of this lesson is to teach patients about the importance of maintaining balance as they age and ways to minimize their risk of falling.  WORKSHOPS   Nutrition:  Fueling a Ship broker led group instruction and group discussion with PowerPoint presentation and patient guidebook. To enhance the learning environment the use of posters, models and videos may be added. Patients will review the foundational principles of the Pritikin Eating Plan and understand what constitutes a serving size in each of the food groups. Patients will also learn Pritikin-friendly foods that are better choices when away from home and review make-ahead meal and snack options. Calorie density will be reviewed and applied to three nutrition priorities: weight maintenance, weight loss, and weight gain. The purpose of this lesson is to reinforce (in a group setting) the key concepts around what patients are recommended to eat and how to apply these guidelines when away from home by planning and selecting Pritikin-friendly options.  Patients will understand how calorie density may be adjusted for different weight management goals.  Mindful Eating  Clinical staff led group instruction and group discussion with PowerPoint presentation and patient guidebook. To enhance the learning environment the use of posters, models and videos may be added. Patients will briefly review the concepts of the Pritikin Eating Plan and the importance of low-calorie dense foods. The concept of mindful eating will be introduced as well as the importance of paying attention to internal hunger signals. Triggers for non-hunger eating and techniques for dealing with triggers will be explored. The purpose of this lesson is to provide patients with the opportunity to review the basic principles of the Pritikin Eating Plan, discuss the value of eating mindfully and how to measure internal cues of hunger and fullness using the Hunger Scale. Patients will also discuss reasons for non-hunger eating and learn strategies to use for controlling emotional eating.  Targeting Your Nutrition Priorities  Clinical staff led group instruction and group discussion with PowerPoint presentation and patient guidebook. To enhance the learning environment the use of posters, models and videos may be added. Patients will learn how to determine their genetic susceptibility to disease by reviewing their family history. Patients will gain insight into the importance of diet as part of an overall healthy lifestyle in mitigating the impact of genetics and other environmental insults. The purpose of this lesson is to provide patients with the opportunity to assess their personal nutrition priorities by looking at their family history, their own health history and current risk factors. Patients will also be able to discuss ways of prioritizing and modifying the Pritikin Eating Plan for their highest risk areas  Menu  Clinical staff led group instruction and group discussion with PowerPoint  presentation and patient guidebook. To enhance the learning environment the use of posters, models and videos may be added. Using menus brought in from [employer], or printed from [employer], patients will apply the Pritikin dining out guidelines that were presented in the [employer] video. Patients will also be able to practice these guidelines in a variety of provided scenarios. The purpose of this lesson is to provide patients with the opportunity to practice hands-on learning of the Pritikin Dining Out guidelines with actual menus and practice scenarios.  Label Reading Clinical staff led group instruction and group discussion with PowerPoint presentation and patient guidebook. To enhance the learning environment the use of posters, models and videos may be added. Patients will review and discuss the Pritikin label reading guidelines presented in Pritikin's Label Reading Educational series video. Using fool labels brought in from local grocery stores and markets, patients will apply the label reading guidelines and determine if the packaged food meet the Pritikin guidelines. The purpose of this lesson is to provide patients with the opportunity to review, discuss, and practice hands-on learning of the Pritikin Label Reading guidelines with actual packaged food labels. Cooking School  Pritikin's [employer] are designed to teach patients ways to prepare quick, simple, and affordable recipes at home. The importance of nutrition's role in chronic disease risk reduction is reflected in its emphasis in the overall Pritikin program. By learning how to prepare essential core Pritikin Eating Plan recipes, patients will increase control over what they eat; be able to customize the flavor of foods without the use of added salt, sugar, or fat; and improve the quality of the food they consume. By learning a set of core recipes which are easily assembled, quickly prepared, and  affordable, patients are more likely to prepare more healthy foods at home. These workshops focus on convenient breakfasts, simple entres, side dishes, and desserts which can be prepared with minimal effort and are consistent with nutrition recommendations for cardiovascular risk reduction. Cooking [employer] are taught by a Armed forces logistics/support/administrative officer (RD) who has been trained by the [employer]. The chef or RD has a clear understanding of the importance of minimizing - if not completely eliminating - added fat, sugar, and sodium in recipes. Throughout the series of Cooking School Workshop sessions, patients will learn about healthy ingredients and efficient methods of cooking to build confidence in their capability to prepare    Cooking School weekly topics:  Adding Flavor- Sodium-Free  Fast and Healthy Breakfasts  Powerhouse Plant-Based Proteins  Satisfying Salads and Dressings  Simple Sides and Sauces  International Cuisine-Spotlight on the [employer] Desserts  [employer]  Efficiency Cooking - Meals in a Snap  Tasty Appetizers and Snacks  Comforting Weekend Breakfasts  One-Pot Wonders   Fast Evening Meals  Landscape architect Your Pritikin Plate  WORKSHOPS   Healthy Mindset (Psychosocial):  Focused Goals, Sustainable Changes Clinical staff led group instruction and group discussion with PowerPoint presentation and patient guidebook. To enhance the learning environment the use of posters, models and videos may be added. Patients will be able to apply effective goal setting strategies to establish at least one personal goal, and then take consistent, meaningful action toward that goal. They will learn to identify common barriers to achieving personal goals and develop strategies to overcome them. Patients will also gain an understanding of how our mind-set can impact our ability to achieve goals and the importance of cultivating a positive  and growth-oriented mind-set. The purpose of this lesson is to provide patients with a deeper understanding of how to set and achieve personal goals, as well as the tools and strategies needed to overcome common obstacles which may arise along the way.  From Head to Heart: The Power of a Healthy Outlook  Clinical staff led group instruction and group discussion with PowerPoint presentation and patient guidebook. To enhance the learning environment the use of posters, models and videos may be added. Patients will be able to recognize and describe the impact of emotions and mood on physical health. They will discover the importance of self-care and explore self-care practices which may work for them. Patients will also learn how to utilize the 4 C's to cultivate a healthier outlook and better manage stress and challenges. The purpose of this lesson is to demonstrate to patients how a healthy outlook is an essential part of maintaining good health, especially as they continue their cardiac rehab journey.  Healthy Sleep for a Healthy Heart Clinical staff led group instruction and group discussion with PowerPoint presentation and patient guidebook. To enhance the learning environment the use of posters, models and videos may be added. At the conclusion of this workshop, patients will be able to demonstrate knowledge of the importance of sleep to overall health, well-being, and quality of life. They will understand the symptoms of, and treatments for, common sleep disorders. Patients will also be able to identify daytime and nighttime behaviors which impact sleep, and they will be able to apply these tools to help manage sleep-related challenges. The purpose of this lesson is to provide patients with a general overview of sleep and outline the importance of quality sleep. Patients will learn about a few of the most common sleep disorders. Patients will also be introduced to the concept of "sleep hygiene," and  discover ways to self-manage certain sleeping problems through simple daily behavior changes. Finally, the workshop will motivate patients by clarifying the links between quality sleep and their goals of heart-healthy living.   Recognizing and Reducing Stress Clinical staff led group instruction and group discussion with PowerPoint presentation and patient guidebook. To enhance the learning environment the use of posters, models and videos may be added. At the conclusion of this workshop, patients will be able to understand the types of stress reactions, differentiate between acute and chronic stress, and recognize the impact that chronic stress has on their health. They will also be able to apply different coping mechanisms, such as reframing negative self-talk. Patients will have the opportunity to practice a variety of stress management techniques, such as deep abdominal breathing, progressive muscle relaxation, and/or guided imagery.  The purpose of  this lesson is to educate patients on the role of stress in their lives and to provide healthy techniques for coping with it.  Learning Barriers/Preferences:  Learning Barriers/Preferences - 02/26/23 1215       Learning Barriers/Preferences   Learning Barriers Sight   wears contacts   Learning Preferences Video;Pictoral             Education Topics:  Knowledge Questionnaire Score:  Knowledge Questionnaire Score - 02/26/23 0848       Knowledge Questionnaire Score   Pre Score 19/24             Core Components/Risk Factors/Patient Goals at Admission:  Personal Goals and Risk Factors at Admission - 02/26/23 0838       Core Components/Risk Factors/Patient Goals on Admission   Tobacco Cessation Yes    Intervention Offer self-teaching materials, assist with locating and accessing local/national Quit Smoking programs, and support quit date choice.    Expected Outcomes Long Term: Complete abstinence from all tobacco products for at  least 12 months from quit date.;Short Term: Will quit all tobacco product use, adhering to prevention of relapse plan.    Lipids Yes    Intervention Provide education and support for participant on nutrition & aerobic/resistive exercise along with prescribed medications to achieve LDL 70mg , HDL >40mg .    Expected Outcomes Short Term: Participant states understanding of desired cholesterol values and is compliant with medications prescribed. Participant is following exercise prescription and nutrition guidelines.;Long Term: Cholesterol controlled with medications as prescribed, with individualized exercise RX and with personalized nutrition plan. Value goals: LDL < 70mg , HDL > 40 mg.             Core Components/Risk Factors/Patient Goals Review:    Core Components/Risk Factors/Patient Goals at Discharge (Final Review):    ITP Comments:  ITP Comments     Row Name 02/26/23 0815           ITP Comments Medical Director- Dr. Armanda Magic, MD. Introduction to the Pritikin Education Program / Intensive Cardiac Rehab. Review of initial orientation folder.                Comments: Pt started cardiac rehab today.  Pt tolerated light exercise without difficulty. VSS, telemetry-Sinus Rhythm, asymptomatic.  Medication list reconciled. Pt denies barriers to medicaiton compliance.  PSYCHOSOCIAL ASSESSMENT:  PHQ-3. Pt exhibits positive coping skills, hopeful outlook with supportive family. No psychosocial needs identified at this time, no psychosocial interventions necessary.    Pt enjoys exercising, reading audio books, travelling and playing golf.   Pt oriented to exercise equipment and routine.    Understanding verbalized. Thayer Headings RN BSN

## 2023-03-06 ENCOUNTER — Encounter (HOSPITAL_COMMUNITY)
Admission: RE | Admit: 2023-03-06 | Discharge: 2023-03-06 | Disposition: A | Discharge status: Home / Self Care | Payer: 59 | Source: Ambulatory Visit | Attending: Cardiovascular Disease | Admitting: Cardiovascular Disease

## 2023-03-06 DIAGNOSIS — Z9889 Other specified postprocedural states: Secondary | ICD-10-CM | POA: Diagnosis not present

## 2023-03-08 ENCOUNTER — Encounter (HOSPITAL_COMMUNITY)
Admission: RE | Admit: 2023-03-08 | Discharge: 2023-03-08 | Disposition: A | Discharge status: Home / Self Care | Payer: 59 | Source: Ambulatory Visit | Attending: Cardiovascular Disease | Admitting: Cardiovascular Disease

## 2023-03-08 ENCOUNTER — Other Ambulatory Visit: Payer: Self-pay | Admitting: Nurse Practitioner

## 2023-03-08 DIAGNOSIS — Z9889 Other specified postprocedural states: Secondary | ICD-10-CM

## 2023-03-08 DIAGNOSIS — I341 Nonrheumatic mitral (valve) prolapse: Secondary | ICD-10-CM

## 2023-03-08 NOTE — Progress Notes (Signed)
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 Area along incision firm denies pain. Will ask onsite provider to assess.   he site is mildly tender, no significant pain, no bruit, bleeding, no signs of infection, but site is raised and firm (he says it has been that way since surgery, but has not improved, surgery was 7/17). thanks!   4 mins PN [name], MD I Remember it being a little bit swollen previously, but I did not think that it felt like to pseudoaneurism. I'm fine getting a duplex scan just to make sure.  1  2 mins EM Joylene Grapes, NP thanks!  2 mins PN [name], MD Thank you for seeing him. Hope you are doing well  2 mins he is still here I just let him know  1  2 mins PN [name], MD Thanks Byrd Hesselbach  2 mins thanks so much  2 mins PN [name], MD Neldon Labella, can you order the duplex scan ?  1  1 min EM Joylene Grapes, NP we've got it covered! thanks!

## 2023-03-13 ENCOUNTER — Encounter (HOSPITAL_COMMUNITY): Payer: 59

## 2023-03-15 ENCOUNTER — Encounter (HOSPITAL_COMMUNITY): Payer: 59

## 2023-03-18 ENCOUNTER — Encounter (HOSPITAL_COMMUNITY): Payer: 59

## 2023-03-18 ENCOUNTER — Encounter (HOSPITAL_COMMUNITY)
Admission: RE | Admit: 2023-03-18 | Discharge: 2023-03-18 | Disposition: A | Discharge status: Home / Self Care | Payer: 59 | Source: Ambulatory Visit | Attending: Cardiovascular Disease | Admitting: Cardiovascular Disease

## 2023-03-18 ENCOUNTER — Inpatient Hospital Stay (HOSPITAL_COMMUNITY)
Admission: RE | Admit: 2023-03-18 | Discharge: 2023-03-18 | Disposition: A | Discharge status: Home / Self Care | Payer: 59 | Source: Ambulatory Visit

## 2023-03-18 ENCOUNTER — Other Ambulatory Visit (HOSPITAL_COMMUNITY)
Admission: RE | Admit: 2023-03-18 | Discharge: 2023-03-18 | Disposition: A | Discharge status: Home / Self Care | Payer: 59 | Source: Ambulatory Visit | Attending: Oncology | Admitting: Oncology

## 2023-03-18 DIAGNOSIS — E782 Mixed hyperlipidemia: Secondary | ICD-10-CM | POA: Diagnosis not present

## 2023-03-18 DIAGNOSIS — Z9889 Other specified postprocedural states: Secondary | ICD-10-CM | POA: Diagnosis present

## 2023-03-18 DIAGNOSIS — Z006 Encounter for examination for normal comparison and control in clinical research program: Secondary | ICD-10-CM | POA: Insufficient documentation

## 2023-03-18 DIAGNOSIS — I341 Nonrheumatic mitral (valve) prolapse: Secondary | ICD-10-CM | POA: Diagnosis not present

## 2023-03-18 DIAGNOSIS — Z48812 Encounter for surgical aftercare following surgery on the circulatory system: Secondary | ICD-10-CM | POA: Diagnosis not present

## 2023-03-19 ENCOUNTER — Ambulatory Visit (HOSPITAL_COMMUNITY)
Admission: RE | Admit: 2023-03-19 | Discharge: 2023-03-19 | Disposition: A | Discharge status: Home / Self Care | Payer: 59 | Source: Ambulatory Visit | Attending: Cardiology | Admitting: Cardiology

## 2023-03-19 DIAGNOSIS — R1903 Right lower quadrant abdominal swelling, mass and lump: Secondary | ICD-10-CM

## 2023-03-19 DIAGNOSIS — Z9889 Other specified postprocedural states: Secondary | ICD-10-CM | POA: Insufficient documentation

## 2023-03-19 DIAGNOSIS — I341 Nonrheumatic mitral (valve) prolapse: Secondary | ICD-10-CM | POA: Insufficient documentation

## 2023-03-20 ENCOUNTER — Encounter (HOSPITAL_COMMUNITY)
Admission: RE | Admit: 2023-03-20 | Discharge: 2023-03-20 | Disposition: A | Discharge status: Home / Self Care | Payer: 59 | Source: Ambulatory Visit | Attending: Cardiovascular Disease | Admitting: Cardiovascular Disease

## 2023-03-20 ENCOUNTER — Encounter (HOSPITAL_COMMUNITY): Payer: 59

## 2023-03-20 DIAGNOSIS — I341 Nonrheumatic mitral (valve) prolapse: Secondary | ICD-10-CM | POA: Diagnosis not present

## 2023-03-20 DIAGNOSIS — Z9889 Other specified postprocedural states: Secondary | ICD-10-CM

## 2023-03-21 ENCOUNTER — Telehealth: Payer: Self-pay

## 2023-03-21 ENCOUNTER — Ambulatory Visit (INDEPENDENT_AMBULATORY_CARE_PROVIDER_SITE_OTHER): Payer: 59 | Admitting: Physician Assistant

## 2023-03-21 VITALS — BP 132/87 | HR 69 | Temp 98.1°F | Resp 18 | Ht 74.75 in | Wt 200.6 lb

## 2023-03-21 DIAGNOSIS — T8189XA Other complications of procedures, not elsewhere classified, initial encounter: Secondary | ICD-10-CM | POA: Diagnosis not present

## 2023-03-21 MED ORDER — SULFAMETHOXAZOLE-TRIMETHOPRIM 800-160 MG PO TABS: 1.0000 | ORAL_TABLET | Freq: Two times a day (BID) | ORAL | 0 refills | Status: AC

## 2023-03-21 NOTE — Progress Notes (Signed)
Office Note     CC:  follow up Requesting Provider:  Garlan Fillers, MD  HPI: Christopher Tanner is a 67 y.o. (1956/01/05) male who presents for evaluation of slow to heal surgical wounds of the right chest status post robotic mitral valve repair performed at the [facility] clinic on 01/23/23.  He reports redness surrounding slow to heal incisions as well as purulent drainage.  He states his cardiologist has removed some suture material during an office visit this week.  He denies any fevers, chills, nausea/vomiting.  He also has a fluid collection in his right groin related to same surgery.  He had a duplex on 03/19/2023 which was negative for pseudoaneurysm however did have a 5 x 5 cm avascular collection consistent with a seroma.  He denies any wound dehiscence or drainage in this area.  He has not taken any antibiotics.  Otherwise, he states his recovery has been going well after surgery.   Past Medical History:  Diagnosis Date   Anal fistula    Hyperlipidemia    on meds   Mitral valve prolapse    Last echocardiogram July, 2008. Moderate MVP of the posterior leaflet. Moderate mitral regurgitation. Normal left ventricular systolic function.   Numbness in feet    Plantar fasciitis    right    Past Surgical History:  Procedure Laterality Date   BUBBLE STUDY  09/08/2020   Procedure: BUBBLE STUDY;  Surgeon: [name], MD;  Location: [facility] ENDOSCOPY;  Service: Cardiovascular;;   COLONOSCOPY  03/06/2021   (DP-hems/normal.  2012), MS at [facility]   hand broken Left    Left hand bones- 4 different times   KNEE ARTHROSCOPY Left 2014   meniscectomy   s/p Robotic Mitral Valve Repair  01/23/2023   At the [facility]   SHOULDER SURGERY Left 12/2016   arthroscopy, biceps tendon repair   TEE WITHOUT CARDIOVERSION N/A 09/08/2020   Procedure: TRANSESOPHAGEAL ECHOCARDIOGRAM (TEE);  Surgeon: Elease Hashimoto Deloris Ping, MD;  Location: [facility] ENDOSCOPY;  Service: Cardiovascular;  Laterality: N/A;    TONSILLECTOMY     TREATMENT FISTULA ANAL  04/2011   WISDOM TOOTH EXTRACTION      Social History   Socioeconomic History   Marital status: Married    Spouse name: [name]   Number of children: 2   Years of education: Attorney   Highest education level: Professional school degree (e.g., MD, DDS, DVM, JD)  Occupational History    Comment: patent attorney  Tobacco Use   Smoking status: Light Smoker    Types: Cigars   Smokeless tobacco: Never   Tobacco comments:    Occasional,  " smokes cigars at weddings  Vaping Use   Vaping status: Never Used  Substance and Sexual Activity   Alcohol use: Yes    Comment: occasional wine, beer liquor   Drug use: No   Sexual activity: Yes  Other Topics Concern   Not on file  Social History Narrative   Lives with wife   Caffeine- 1-2 a day   Social Determinants of Health   Financial Resource Strain: Not on file  Food Insecurity: No Food Insecurity (01/24/2023)   Received [facility]   Hunger Vital Sign    Worried About Running Out of Food in the Last Year: Never true    Ran Out of Food in the Last Year: Never true  Transportation Needs: No Transportation Needs (01/24/2023)   Received from [facility]    Lack  of Transportation (Medical): No    Lack of Transportation (Non-Medical): No  Physical Activity: Not on file  Stress: Not on file  Social Connections: Unknown (11/18/2021)   Received from [facility]   Social Network    Social Network: Not on file  Intimate Partner Violence: Unknown (10/11/2021)   Received from [facility]   HITS    Physically Hurt: Not on file    Insult or Talk Down To: Not on file    Threaten Physical Harm: Not on file    Scream or Curse: Not on file    Family History  Problem Relation Age of Onset   Uterine cancer Mother    Diabetes Mother    Fibromyalgia Mother    Heart disease Father    Colon polyps Neg Hx    Colon cancer Neg Hx     Esophageal cancer Neg Hx    Stomach cancer Neg Hx    Rectal cancer Neg Hx     Current Outpatient Medications  Medication Sig Dispense Refill   acetaminophen (TYLENOL) 325 MG tablet as needed.     aspirin 81 MG tablet Take 81 mg by mouth daily.     carboxymethylcellulose (REFRESH PLUS) 0.5 % SOLN Place 1 drop into both eyes 2 (two) times daily.     ibuprofen (ADVIL) 200 MG tablet as needed.     metoprolol tartrate (LOPRESSOR) 25 MG tablet Take 0.5 tablets (12.5 mg total) by mouth 2 (two) times daily. 90 tablet 3   Multiple Vitamins-Minerals (MULTIVITAL PERFORMANCE) TABS Take 1 tablet by mouth daily.     psyllium (METAMUCIL SMOOTH TEXTURE) 28 % packet Take 1 packet by mouth at bedtime.     rosuvastatin (CRESTOR) 5 MG tablet TAKE 1 TABLET (5 MG TOTAL) BY MOUTH DAILY. (Patient taking differently: Take 5 mg by mouth every other day.) 30 tablet 0   sildenafil (REVATIO) 20 MG tablet daily as needed.     sulfamethoxazole-trimethoprim (BACTRIM DS) 800-160 MG tablet Take 1 tablet by mouth 2 (two) times daily for 7 days. 14 tablet 0   amoxicillin (AMOXIL) 500 MG capsule Take 4 capsules by mouth one hour prior to dental cleanings/procedures (Patient not taking: Reported on 03/21/2023) 16 capsule 4   Current Facility-Administered Medications  Medication Dose Route Frequency Provider Last Rate Last Admin   0.9 %  sodium chloride infusion  500 mL Intravenous Once Meryl Dare, MD        Allergies  Allergen Reactions   Ezetimibe-Simvastatin     Stopped due to publicity about zetia   Simvastatin     Memory Loss   Niaspan [Niacin] Other (See Comments)    flushing     REVIEW OF SYSTEMS:   [X]  denotes positive finding, [ ]  denotes negative finding Cardiac  Comments:  Chest pain or chest pressure:    Shortness of breath upon exertion:    Short of breath when lying flat:    Irregular heart rhythm:        Vascular    Pain in calf, thigh, or hip brought on by ambulation:    Pain in feet  at night that wakes you up from your sleep:     Blood clot in your veins:    Leg swelling:         Pulmonary    Oxygen at home:    Productive cough:     Wheezing:         Neurologic  Sudden weakness in arms or legs:     Sudden numbness in arms or legs:     Sudden onset of difficulty speaking or slurred speech:    Temporary loss of vision in one eye:     Problems with dizziness:         Gastrointestinal    Blood in stool:     Vomited blood:         Genitourinary    Burning when urinating:     Blood in urine:        Psychiatric    Major depression:         Hematologic    Bleeding problems:    Problems with blood clotting too easily:        Skin    Rashes or ulcers:        Constitutional    Fever or chills:      PHYSICAL EXAMINATION:  Vitals:   03/21/23 1415  BP: 132/87  Pulse: 69  Resp: 18  Temp: 98.1 F (36.7 C)  TempSrc: Temporal  SpO2: 97%  Weight: 200 lb 9.6 oz (91 kg)  Height: 6' 2.75" (1.899 m)    General:  WDWN in NAD; vital signs documented above Gait: Not observed HENT: WNL, normocephalic Pulmonary: normal non-labored breathing , without Rales, rhonchi,  wheezing Cardiac: regular HR Skin: without rashes Vascular Exam/Pulses: Symmetrical radial pulses Extremities: Right chest with 2 notable areas of surgical wound dehiscence with minimal purulent drainage with manipulation; tender to touch; exposed suture material; wound bed is superficial; no other areas of fluctuance  Right groin with palpable fluid collection; incision completely healed with no areas of redness or surrounding erythema Musculoskeletal: no muscle wasting or atrophy  Neurologic: A&O X 3 Psychiatric:  The pt has Normal affect.   Non-Invasive Vascular Imaging:   Arterial pseudoaneurysm duplex on 03/19/2023 negative for pseudoaneurysm; 5 x 5 cm avascular fluid collection consistent with seroma    ASSESSMENT/PLAN:: 67 y.o. male here for evaluation of slow to heal surgical  wounds of the right chest as well as the right groin related to recent robotic mitral valve repair.   -Mr. Elim Piel is a 67 year old male who underwent robotic mitral valve repair at the [facility] clinic on 01/23/2023.  Incisions of the right chest have been slow to heal and he was referred to our office for evaluation with concern for infection.  On exam he appears to have an allergic reaction to suture material.  All suture material was removed and is dictated in a separate procedure note below.  Right groin pseudoaneurysm duplex was negative for pseudoaneurysm.  He does have a palpable fluid collection which was a 5 x 5 cm avascular fluid collection most likely seroma based on duplex pictures.  Incision has completely healed and there is no surrounding erythema.  I asked him to use a warm compress and light massage to help speed reabsorption in this area.  No indication for surgical intervention at this time.  I have started him on a week of Bactrim.  He can follow-up with his cardiologist as scheduled.  Should his wounds worsen or fail to heal, he can call our office for a follow-up visit.  Procedure note: On exam there were 2 areas of superficial incision dehiscence.  Wounds were manipulated to express all purulent material.  The cephalad most incision was probed and had a tunneling channel connecting to poles of the incision.  The skin bridge between these wounds was resected.  All  exposed suture material was removed from the wound bed.  The lower incision was probed to about 1 cm in depth.  This wound was widened using blunt and sharp dissection.  All suture material was removed from the wound bed.  Both wounds were bandaged with a dry gauze and paper tape.   Emilie Rutter, PA-C [facility] [phone]  Clinic MD:   Randie Heinz

## 2023-03-21 NOTE — Telephone Encounter (Signed)
Pat with [facility] called stating that Dr. Elease Hashimoto spoke with Dr. Chestine Spore regarding a groin seroma. Pt has several incisions that are reddened and oozing a milky drainage.  Confirmed add-on with Dr. Chestine Spore for evaluation today in clinic. Pt called and added to schedule. Confirmed understanding.

## 2023-03-21 NOTE — Telephone Encounter (Signed)
Nahser, Deloris Ping, MD  to Me  Lars Mage, RN     03/21/23  8:40 AM Good morning.  Will you see if [facility] would be able to work him into an office visit today . I discussed the case with Dr. Chestine Spore by phone yesterday but at this point, any MD or APP would work.  They are all great providers  PN  Call placed to [facility] and message left on clinical team voicemail regarding scheduling appointment today.  Left message to call office.

## 2023-03-22 ENCOUNTER — Encounter (HOSPITAL_COMMUNITY)
Admission: RE | Admit: 2023-03-22 | Discharge: 2023-03-22 | Disposition: A | Discharge status: Home / Self Care | Payer: 59 | Source: Ambulatory Visit | Attending: Cardiovascular Disease | Admitting: Cardiovascular Disease

## 2023-03-22 ENCOUNTER — Encounter (HOSPITAL_COMMUNITY): Payer: 59

## 2023-03-22 DIAGNOSIS — I341 Nonrheumatic mitral (valve) prolapse: Secondary | ICD-10-CM | POA: Diagnosis not present

## 2023-03-22 DIAGNOSIS — Z9889 Other specified postprocedural states: Secondary | ICD-10-CM

## 2023-03-25 ENCOUNTER — Encounter (HOSPITAL_COMMUNITY)
Admission: RE | Admit: 2023-03-25 | Discharge: 2023-03-25 | Disposition: A | Discharge status: Home / Self Care | Payer: 59 | Source: Ambulatory Visit | Attending: Cardiovascular Disease | Admitting: Cardiovascular Disease

## 2023-03-25 DIAGNOSIS — I341 Nonrheumatic mitral (valve) prolapse: Secondary | ICD-10-CM | POA: Diagnosis not present

## 2023-03-25 DIAGNOSIS — Z9889 Other specified postprocedural states: Secondary | ICD-10-CM

## 2023-03-27 ENCOUNTER — Encounter (HOSPITAL_COMMUNITY)
Admission: RE | Admit: 2023-03-27 | Discharge: 2023-03-27 | Disposition: A | Discharge status: Home / Self Care | Payer: 59 | Source: Ambulatory Visit | Attending: Cardiovascular Disease | Admitting: Cardiovascular Disease

## 2023-03-27 DIAGNOSIS — Z9889 Other specified postprocedural states: Secondary | ICD-10-CM

## 2023-03-27 DIAGNOSIS — I341 Nonrheumatic mitral (valve) prolapse: Secondary | ICD-10-CM | POA: Diagnosis not present

## 2023-03-29 ENCOUNTER — Encounter (HOSPITAL_COMMUNITY)
Admission: RE | Admit: 2023-03-29 | Discharge: 2023-03-29 | Disposition: A | Discharge status: Home / Self Care | Payer: 59 | Source: Ambulatory Visit | Attending: Cardiovascular Disease | Admitting: Cardiovascular Disease

## 2023-03-29 DIAGNOSIS — Z9889 Other specified postprocedural states: Secondary | ICD-10-CM

## 2023-03-29 DIAGNOSIS — I341 Nonrheumatic mitral (valve) prolapse: Secondary | ICD-10-CM | POA: Diagnosis not present

## 2023-04-01 ENCOUNTER — Encounter (HOSPITAL_COMMUNITY)
Admission: RE | Admit: 2023-04-01 | Discharge: 2023-04-01 | Disposition: A | Discharge status: Home / Self Care | Payer: 59 | Source: Ambulatory Visit | Attending: Cardiovascular Disease | Admitting: Cardiovascular Disease

## 2023-04-01 DIAGNOSIS — I341 Nonrheumatic mitral (valve) prolapse: Secondary | ICD-10-CM | POA: Diagnosis not present

## 2023-04-01 DIAGNOSIS — Z9889 Other specified postprocedural states: Secondary | ICD-10-CM

## 2023-04-02 LAB — HELIX MOLECULAR SCREEN: Genetic Analysis Overall Interpretation: NEGATIVE

## 2023-04-02 NOTE — Progress Notes (Signed)
Cardiac Individual Treatment Plan  Patient Details  Name: Christopher Tanner MRN: [id number] Date of Birth: Mar 23, 1956 Referring Provider:   Flowsheet Row INTENSIVE CARDIAC REHAB ORIENT from 02/26/2023 in [facility]  Referring Provider Tanner, Christopher Ping, MD.       Initial Encounter Date:  Flowsheet Row INTENSIVE CARDIAC REHAB ORIENT from 02/26/2023 in [facility]  Date 02/26/23       Visit Diagnosis: 01/23/23 MVR (mitral valve repair) at the [facility]  Patient's Home Medications on Admission:  Current Outpatient Medications:    acetaminophen (TYLENOL) 325 MG tablet, as needed., Disp: , Rfl:    amoxicillin (AMOXIL) 500 MG capsule, Take 4 capsules by mouth one hour prior to dental cleanings/procedures (Patient not taking: Reported on 03/21/2023), Disp: 16 capsule, Rfl: 4   aspirin 81 MG tablet, Take 81 mg by mouth daily., Disp: , Rfl:    carboxymethylcellulose (REFRESH PLUS) 0.5 % SOLN, Place 1 drop into both eyes 2 (two) times daily., Disp: , Rfl:    ibuprofen (ADVIL) 200 MG tablet, as needed., Disp: , Rfl:    metoprolol tartrate (LOPRESSOR) 25 MG tablet, Take 0.5 tablets (12.5 mg total) by mouth 2 (two) times daily., Disp: 90 tablet, Rfl: 3   Multiple Vitamins-Minerals (MULTIVITAL PERFORMANCE) TABS, Take 1 tablet by mouth daily., Disp: , Rfl:    psyllium (METAMUCIL SMOOTH TEXTURE) 28 % packet, Take 1 packet by mouth at bedtime., Disp: , Rfl:    rosuvastatin (CRESTOR) 5 MG tablet, TAKE 1 TABLET (5 MG TOTAL) BY MOUTH DAILY. (Patient taking differently: Take 5 mg by mouth every other day.), Disp: 30 tablet, Rfl: 0   sildenafil (REVATIO) 20 MG tablet, daily as needed., Disp: , Rfl:   Current Facility-Administered Medications:    0.9 %  sodium chloride infusion, 500 mL, Intravenous, Once, Christopher Dare, MD  Past Medical History: Past Medical History:  Diagnosis Date    Anal fistula    Hyperlipidemia    on meds   Mitral valve prolapse    Last echocardiogram July, 2008. Moderate MVP of the posterior leaflet. Moderate mitral regurgitation. Normal left ventricular systolic function.   Numbness in feet    Plantar fasciitis    right    Tobacco Use: Social History   Tobacco Use  Smoking Status Light Smoker   Types: Cigars  Smokeless Tobacco Never  Tobacco Comments   Occasional,  " smokes cigars at weddings    Labs: Review Flowsheet  More data exists      Latest Ref Rng & Units 11/22/2017 02/24/2019 06/03/2019 08/19/2020 10/16/2021  Labs for ITP Cardiac and Pulmonary Rehab  Cholestrol 100 - 199 mg/dL 416  606  301  601  093   LDL (calc) 0 - 99 mg/dL 95  235  92  87  90   HDL-C >39 mg/dL 66  61  62  65  66   Trlycerides 0 - 149 mg/dL 573  220  99  91  254     Details            Capillary Blood Glucose: No results found for: "GLUCAP"   Exercise Target Goals: Exercise Program Goal: Individual exercise prescription set using results from initial 6 min walk test and THRR while considering  patient's activity barriers and safety.   Exercise Prescription Goal: Initial exercise prescription builds to 30-45 minutes a day of aerobic activity, 2-3 days  per week.  Home exercise guidelines will be given to patient during program as part of exercise prescription that the participant will acknowledge.  Activity Barriers & Risk Stratification:  Activity Barriers & Cardiac Risk Stratification - 02/26/23 0901       Activity Barriers & Cardiac Risk Stratification   Activity Barriers Other (comment)    Comments Numbness- both feet, plantar fasciitis- right foot, h/x fx x4 - left hand, left knee arthroscopy/ meniscectomy, left shoulder arthroscopy/ biceps tendon repair.    Cardiac Risk Stratification Low             6 Minute Walk:  6 Minute Walk     Row Name 02/26/23 0930         6 Minute Walk   Phase Initial     Distance 1805 feet      Walk Time 6 minutes     # of Rest Breaks 0     MPH 3.42     METS 4.17     RPE 11     Perceived Dyspnea  0     VO2 Peak 14.61     Symptoms No     Resting HR 67 bpm     Resting BP 102/70     Resting Oxygen Saturation  98 %     Exercise Oxygen Saturation  during 6 min walk 97 %     Max Ex. HR 79 bpm     Max Ex. BP 140/78     2 Minute Post BP 116/78              Oxygen Initial Assessment:   Oxygen Re-Evaluation:   Oxygen Discharge (Final Oxygen Re-Evaluation):   Initial Exercise Prescription:  Initial Exercise Prescription - 02/26/23 1000       Date of Initial Exercise RX and Referring Provider   Date 02/26/23    Referring Provider Tanner, Christopher Ping, MD.    Expected Discharge Date 05/22/23      Bike   Level 3    Watts 60    Minutes 15    METs 4.2      Elliptical   Level 1    Speed 1    Minutes 15    METs 4.2      Prescription Details   Frequency (times per week) 3    Duration Progress to 30 minutes of continuous aerobic without signs/symptoms of physical distress      Intensity   THRR 40-80% of Max Heartrate 62-123    Ratings of Perceived Exertion 11-13    Perceived Dyspnea 0-4      Progression   Progression Continue to progress workloads to maintain intensity without signs/symptoms of physical distress.      Resistance Training   Training Prescription Yes    Weight 5 lbs    Reps 10-15             Perform Capillary Blood Glucose checks as needed.  Exercise Prescription Changes:   Exercise Prescription Changes     Row Name 03/04/23 1200 03/18/23 1200 04/01/23 1100         Response to Exercise   Blood Pressure (Admit) 102/62 112/70 130/70     Blood Pressure (Exercise) 138/78 138/7 132/68     Blood Pressure (Exit) 104/60 94/54 106/64     Heart Rate (Admit) 68 bpm 78 bpm 69 bpm     Heart Rate (Exercise) 103 bpm 116 bpm 100 bpm     Heart Rate (Exit) 102  bpm 86 bpm 78 bpm     Rating of Perceived Exertion (Exercise) 12 13 11       Perceived Dyspnea (Exercise) 0 -- --     Symptoms none None None     Comments pt first day Reviewed METs Discussed METs, goals, and home ExRx with pt. Pt voiced understanding of METs. [name]'s goals are to return to cycling and hiking as he has started his cycling and hiking again. He has not had signs/symptoms when cycling or hiking. He monitors his heart rate while doing both and is not going out of THR. Rhodney is meeting exercise guidlines as he cycles, walks, or hikes 5-7 days/wk for at least 30 min/day. Discussed safetly guidelines and weather restrictions. Ariz voiced understanding. Peak METs 7.8.     Duration Continue with 30 min of aerobic exercise without signs/symptoms of physical distress. Continue with 30 min of aerobic exercise without signs/symptoms of physical distress. Continue with 30 min of aerobic exercise without signs/symptoms of physical distress.     Intensity THRR unchanged THRR unchanged THRR unchanged       Progression   Progression Continue to progress workloads to maintain intensity without signs/symptoms of physical distress. Continue to progress workloads to maintain intensity without signs/symptoms of physical distress. Continue to progress workloads to maintain intensity without signs/symptoms of physical distress.     Average METs 4.4 6.8 6.5       Resistance Training   Training Prescription Yes Yes Yes     Weight 5 5 5  lbs     Reps 10-15 10-15 10-15     Time -- 10 Minutes 10 Minutes       Interval Training   Interval Training -- No No       Bike   Level 3 3 3      Watts 73 117 75     Minutes 15 15 15      METs 4.4 6.3 7.8       Elliptical   Level 1 2 2      Speed 1 2 2      Minutes 15 15 15      METs -- 7.4 6.5       Home Exercise Plan   Plans to continue exercise at -- -- Home (comment)     Frequency -- -- Add 2 additional days to program exercise sessions.     Initial Home Exercises Provided -- -- 04/01/23              Exercise Comments:    Exercise Comments     Row Name 03/04/23 1218 03/18/23 1213 04/01/23 1111       Exercise Comments Pt did very well his first day exercising, pt averaged 4.4 METs w/o any abnormal signs or symptoms. Will continue to progress patient as tolerated. Reviewed METs, pt is progressing well; peak METs 7.4 Discussed METs, goals, and home ExRx with pt. Pt voiced understanding of METs. Saad's goals are to return to cycling and hiking as he has started his cycling and hiking again. He has not had signs/symptoms when cycling or hiking. He monitors his heart rate while doing both and is not going out of THR. Eino is meeting exercise guidlines as he cycles, walks, or hikes 5-7 days/wk for at least 30 min/day. Discussed safetly guidelines and weather restrictions. Cordale voiced understanding. Peak METs 7.8.              Exercise Goals and Review:   Exercise Goals     Row  Name 02/26/23 0903             Exercise Goals   Increase Physical Activity Yes       Intervention Provide advice, education, support and counseling about physical activity/exercise needs.;Develop an individualized exercise prescription for aerobic and resistive training based on initial evaluation findings, risk stratification, comorbidities and participant's personal goals.       Expected Outcomes Short Term: Attend rehab on a regular basis to increase amount of physical activity.;Long Term: Add in home exercise to make exercise part of routine and to increase amount of physical activity.;Long Term: Exercising regularly at least 3-5 days a week.       Increase Strength and Stamina Yes       Intervention Provide advice, education, support and counseling about physical activity/exercise needs.;Develop an individualized exercise prescription for aerobic and resistive training based on initial evaluation findings, risk stratification, comorbidities and participant's personal goals.       Expected Outcomes Short Term: Increase workloads from  initial exercise prescription for resistance, speed, and METs.;Short Term: Perform resistance training exercises routinely during rehab and add in resistance training at home;Long Term: Improve cardiorespiratory fitness, muscular endurance and strength as measured by increased METs and functional capacity ( )       Able to understand and use rate of perceived exertion (RPE) scale Yes       Intervention Provide education and explanation on how to use RPE scale       Expected Outcomes Short Term: Able to use RPE daily in rehab to express subjective intensity level;Long Term:  Able to use RPE to guide intensity level when exercising independently       Knowledge and understanding of Target Heart Rate Range (THRR) Yes       Intervention Provide education and explanation of THRR including how the numbers were predicted and where they are located for reference       Expected Outcomes Short Term: Able to state/look up THRR;Long Term: Able to use THRR to govern intensity when exercising independently;Short Term: Able to use daily as guideline for intensity in rehab       Able to check pulse independently Yes       Intervention Provide education and demonstration on how to check pulse in carotid and radial arteries.;Review the importance of being able to check your own pulse for safety during independent exercise       Expected Outcomes Short Term: Able to explain why pulse checking is important during independent exercise;Long Term: Able to check pulse independently and accurately       Understanding of Exercise Prescription Yes       Intervention Provide education, explanation, and written materials on patient's individual exercise prescription       Expected Outcomes Short Term: Able to explain program exercise prescription;Long Term: Able to explain home exercise prescription to exercise independently                Exercise Goals Re-Evaluation :  Exercise Goals Re-Evaluation     Row Name  03/04/23 1214 04/01/23 1105           Exercise Goal Re-Evaluation   Exercise Goals Review Increase Physical Activity;Knowledge and understanding of Target Heart Rate Range (THRR);Increase Strength and Stamina;Able to understand and use rate of perceived exertion (RPE) scale;Understanding of Exercise Prescription;Able to understand and use Dyspnea scale Increase Physical Activity;Knowledge and understanding of Target Heart Rate Range (THRR);Increase Strength and Stamina;Able to understand and use rate of perceived  exertion (RPE) scale;Understanding of Exercise Prescription;Able to understand and use Dyspnea scale      Comments Pt first day in program, pt averaged 4.4 METs during his first day in program. He tolerated exercise without any abnormal s/s. Pt was educated on THRR and RPE. Discussed METs, goals, and home ExRx with pt. Pt voiced understanding of METs. Harmon's goals are to return to cycling and hiking as he has started his cycling and hiking again. He has not had signs/symptoms when cycling or hiking. He monitors his heart rate while doing both and is not going out of THR. Jshawn is meeting exercise guidlines as he cycles, walks, or hikes 5-7 days/wk for at least 30 min/day. Discussed safetly guidelines and weather restrictions. Johannes voiced understanding. Peak METs 7.8.      Expected Outcomes Will continue to progress pt through program while increasing resistance in the absence of any abnormal s/s. Will continue to progress pt through program while increasing resistance in the absence of any abnormal s/s.               Discharge Exercise Prescription (Final Exercise Prescription Changes):  Exercise Prescription Changes - 04/01/23 1100       Response to Exercise   Blood Pressure (Admit) 130/70    Blood Pressure (Exercise) 132/68    Blood Pressure (Exit) 106/64    Heart Rate (Admit) 69 bpm    Heart Rate (Exercise) 100 bpm    Heart Rate (Exit) 78 bpm    Rating of Perceived Exertion  (Exercise) 11    Symptoms None    Comments Discussed METs, goals, and home ExRx with pt. Pt voiced understanding of METs. Deyvi's goals are to return to cycling and hiking as he has started his cycling and hiking again. He has not had signs/symptoms when cycling or hiking. He monitors his heart rate while doing both and is not going out of THR. French is meeting exercise guidlines as he cycles, walks, or hikes 5-7 days/wk for at least 30 min/day. Discussed safetly guidelines and weather restrictions. Mauel voiced understanding. Peak METs 7.8.    Duration Continue with 30 min of aerobic exercise without signs/symptoms of physical distress.    Intensity THRR unchanged      Progression   Progression Continue to progress workloads to maintain intensity without signs/symptoms of physical distress.    Average METs 6.5      Resistance Training   Training Prescription Yes    Weight 5 lbs    Reps 10-15    Time 10 Minutes      Interval Training   Interval Training No      Bike   Level 3    Watts 75    Minutes 15    METs 7.8      Elliptical   Level 2    Speed 2    Minutes 15    METs 6.5      Home Exercise Plan   Plans to continue exercise at Home (comment)    Frequency Add 2 additional days to program exercise sessions.    Initial Home Exercises Provided 04/01/23             Nutrition:  Target Goals: Understanding of nutrition guidelines, daily intake of sodium 1500mg , cholesterol 200mg , calories 30% from fat and 7% or less from saturated fats, daily to have 5 or more servings of fruits and vegetables.  Biometrics:  Pre Biometrics - 02/26/23 0815       Pre Biometrics  Waist Circumference 40.25 inches    Hip Circumference 41.75 inches    Waist to Hip Ratio 0.96 %    Triceps Skinfold 12 mm    % Body Fat 25 %    Grip Strength 46 kg    Flexibility 8 in    Single Leg Stand 30 seconds              Nutrition Therapy Plan and Nutrition Goals:  Nutrition Therapy & Goals  - 03/04/23 1005       Nutrition Therapy   Diet heart healthy diet    Drug/Food Interactions Statins/Certain Fruits      Personal Nutrition Goals   Nutrition Goal Patient to identify strategies for reducing cardiovascular risk by attending the [employer] education and nutrition series weekly.    Personal Goal #2 Patient to improve diet quality by using the plate method as a guide for meal planning to include lean protein/plant protein, fruits, vegetables, whole grains, nonfat dairy as part of a well-balanced diet.    Personal Goal #3 Patient to reduce sodium to 2000mg  per day    Comments Gaby has medical history of mitral valve prolapse s/p surgical repair at the [facility] and hyperlipidemia. Patient will benefit from participation in intensive cardiac rehab for nutrition, exercise, and lifestyle modification.      Intervention Plan   Intervention Prescribe, educate and counsel regarding individualized specific dietary modifications aiming towards targeted core components such as weight, hypertension, lipid management, diabetes, heart failure and other comorbidities.;Nutrition handout(s) given to patient.    Expected Outcomes Short Term Goal: Understand basic principles of dietary content, such as calories, fat, sodium, cholesterol and nutrients.;Long Term Goal: Adherence to prescribed nutrition plan.             Nutrition Assessments:  Nutrition Assessments - 03/06/23 1629       Rate Your Plate Scores   Pre Score 61            MEDIFICTS Score Key: >=70 Need to make dietary changes  40-70 Heart Healthy Diet <= 40 Therapeutic Level Cholesterol Diet   Flowsheet Row INTENSIVE CARDIAC REHAB from 03/06/2023 in [facility]  Picture Your Plate Total Score on Admission 61      Picture Your Plate Scores: <16 Unhealthy dietary pattern with much room for improvement. 41-50 Dietary pattern unlikely to meet  recommendations for good health and room for improvement. 51-60 More healthful dietary pattern, with some room for improvement.  >60 Healthy dietary pattern, although there may be some specific behaviors that could be improved.    Nutrition Goals Re-Evaluation:  Nutrition Goals Re-Evaluation     Row Name 03/04/23 1005             Goals   Current Weight 195 lb 5.2 oz (88.6 kg)       Comment lipids WNL-LDL 90       Expected Outcome Xzaveon has medical history of mitral valve prolapse s/p surgical repair at the [facility]  and hyperlipidemia. Patient will benefit from participation in intensive cardiac rehab for nutrition, exercise, and lifestyle modification.                Nutrition Goals Re-Evaluation:  Nutrition Goals Re-Evaluation     Row Name 03/04/23 1005             Goals   Current Weight 195 lb 5.2 oz (88.6 kg)       Comment lipids  WNL-LDL 90       Expected Outcome Zaylon has medical history of mitral valve prolapse s/p surgical repair at the [facility]  and hyperlipidemia. Patient will benefit from participation in intensive cardiac rehab for nutrition, exercise, and lifestyle modification.                Nutrition Goals Discharge (Final Nutrition Goals Re-Evaluation):  Nutrition Goals Re-Evaluation - 03/04/23 1005       Goals   Current Weight 195 lb 5.2 oz (88.6 kg)    Comment lipids WNL-LDL 90    Expected Outcome Savyon has medical history of mitral valve prolapse s/p surgical repair at the [facility]  and hyperlipidemia. Patient will benefit from participation in intensive cardiac rehab for nutrition, exercise, and lifestyle modification.             Psychosocial: Target Goals: Acknowledge presence or absence of significant depression and/or stress, maximize coping skills, provide positive support system. Participant is able to verbalize types and ability to use techniques and skills needed for reducing stress and  depression.  Initial Review & Psychosocial Screening:  Initial Psych Review & Screening - 02/26/23 0936       Initial Review   Current issues with None Identified      Family Dynamics   Good Support System? Yes   Uri has his wife for support     Barriers   Psychosocial barriers to participate in program There are no identifiable barriers or psychosocial needs.      Screening Interventions   Interventions Encouraged to exercise             Quality of Life Scores:  Quality of Life - 02/26/23 0859       Quality of Life   Select Quality of Life      Quality of Life Scores   Health/Function Pre 28.7 %    Socioeconomic Pre 28.75 %    Psych/Spiritual Pre 28.93 %    Family Pre 30 %    GLOBAL Pre 28.94 %            Scores of 19 and below usually indicate a poorer quality of life in these areas.  A difference of  2-3 points is a clinically meaningful difference.  A difference of 2-3 points in the total score of the Quality of Life Index has been associated with significant improvement in overall quality of life, self-image, physical symptoms, and general health in studies assessing change in quality of life.  PHQ-9: Review Flowsheet       02/26/2023  Depression screen PHQ 2/9  Decreased Interest 1  Down, Depressed, Hopeless 0  PHQ - 2 Score 1  Altered sleeping 1  Tired, decreased energy 1  Change in appetite 0  Feeling bad or failure about yourself  0  Trouble concentrating 0  Moving slowly or fidgety/restless 0  Suicidal thoughts 0  PHQ-9 Score 3  Difficult doing work/chores Not difficult at all    Details           Interpretation of Total Score  Total Score Depression Severity:  1-4 = Minimal depression, 5-9 = Mild depression, 10-14 = Moderate depression, 15-19 = Moderately severe depression, 20-27 = Severe depression   Psychosocial Evaluation and Intervention:   Psychosocial Re-Evaluation:  Psychosocial Re-Evaluation     Row Name 03/05/23 0849  04/02/23 1015           Psychosocial Re-Evaluation   Current issues with None Identified None Identified  Interventions Encouraged to attend Cardiac Rehabilitation for the exercise Encouraged to attend Cardiac Rehabilitation for the exercise      Continue Psychosocial Services  No Follow up required No Follow up required               Psychosocial Discharge (Final Psychosocial Re-Evaluation):  Psychosocial Re-Evaluation - 04/02/23 1015       Psychosocial Re-Evaluation   Current issues with None Identified    Interventions Encouraged to attend Cardiac Rehabilitation for the exercise    Continue Psychosocial Services  No Follow up required             Vocational Rehabilitation: Provide vocational rehab assistance to qualifying candidates.   Vocational Rehab Evaluation & Intervention:  Vocational Rehab - 02/26/23 4742       Initial Vocational Rehab Evaluation & Intervention   Assessment shows need for Vocational Rehabilitation No   Tkai is retired and does not need vocational rehab at this time            Education: Education Goals: Education classes will be provided on a weekly basis, covering required topics. Participant will state understanding/return demonstration of topics presented.    Education     Row Name 03/04/23 1100     Education   Cardiac Education Topics [employer]   Select Core Videos     Core Videos   Educator Dietitian   Select Nutrition   Nutrition Overview of the [employer] Eating Plan   Instruction Review Code 1- Verbalizes Understanding   Class Start Time 0815   Class Stop Time 0850   Class Time Calculation (min) 35 min    Row Name 03/06/23 1100     Education   Cardiac Education Topics [facility]   Orthoptist   Educator Dietitian   Weekly Topic Fast and Healthy Breakfasts   Instruction Review Code 1- Verbalizes Understanding   Class Start Time 0815   Class Stop Time 0847   Class Time  Calculation (min) 32 min    Row Name 03/08/23 0800     Education   Cardiac Education Topics [employer]   Nurse, children's   Educator Exercise Physiologist   Select Psychosocial   Psychosocial Healthy Minds, Bodies, Hearts   Instruction Review Code 1- Verbalizes Understanding   Class Start Time 0813   Class Stop Time 0845   Class Time Calculation (min) 32 min    Row Name 03/18/23 0900     Education   Cardiac Education Topics [employer]   Glass blower/designer Nutrition   Nutrition Workshop Label Reading   Instruction Review Code 1- Verbalizes Understanding   Class Start Time 0815   Class Stop Time 0904   Class Time Calculation (min) 49 min    Row Name 03/20/23 1100     Education   Cardiac Education Topics [employer]   Customer service manager   Weekly Topic [employer] Desserts   Instruction Review Code 1- Verbalizes Understanding   Class Start Time 0820   Class Stop Time 0900   Class Time Calculation (min) 40 min    Row Name 03/22/23 1000     Education   Cardiac Education Topics [employer]in   Nurse, children's   Educator Dietitian   Nutrition Other  Label Reading  Instruction Review Code 1- Verbalizes Understanding   Class Start Time 0815   Class Stop Time 0900   Class Time Calculation (min) 45 min    Row Name 03/25/23 1100     Education   Cardiac Education Topics Pritikin   [employer] Workshops     Workshops   Educator Exercise Physiologist   [facility] Psychosocial   Psychosocial Workshop Recognizing and Reducing Stress   Instruction Review Code 1- Verbalizes Understanding   Class Start Time 0815   Class Stop Time 0900   Class Time Calculation (min) 45 min    Row Name 03/27/23 1000     Education   Cardiac Education Topics Pritikin   Orthoptist   Educator Dietitian   Weekly Topic Tasty Appetizers and Snacks    Instruction Review Code 1- Verbalizes Understanding   Class Start Time 0815   Class Stop Time 0900   Class Time Calculation (min) 45 min    Row Name 03/29/23 1300     Education   Cardiac Education Topics Pritikin   Nurse, children's Exercise Physiologist   [facility] Nutrition   Nutrition Calorie Density   Instruction Review Code 1- Verbalizes Understanding   Class Start Time [phone]   Class Stop Time 0900   Class Time Calculation (min) 44 min            Core Videos: Exercise    Move It!  Clinical staff conducted group or individual video education with verbal and written material and guidebook.  Patient learns the recommended Pritikin exercise program. Exercise with the goal of living a long, healthy life. Some of the health benefits of exercise include controlled diabetes, healthier blood pressure levels, improved cholesterol levels, improved heart and lung capacity, improved sleep, and better body composition. Everyone should speak with their doctor before starting or changing an exercise routine.  Biomechanical Limitations Clinical staff conducted group or individual video education with verbal and written material and guidebook.  Patient learns how biomechanical limitations can impact exercise and how we can mitigate and possibly overcome limitations to have an impactful and balanced exercise routine.  Body Composition Clinical staff conducted group or individual video education with verbal and written material and guidebook.  Patient learns that body composition (ratio of muscle mass to fat mass) is a key component to assessing overall fitness, rather than body weight alone. Increased fat mass, especially visceral belly fat, can put [place] at increased risk for metabolic syndrome, type 2 diabetes, heart disease, and even death. It is recommended to combine diet and exercise (cardiovascular and resistance training) to improve your body composition. Seek  guidance from your physician and exercise physiologist before implementing an exercise routine.  Exercise Action Plan Clinical staff conducted group or individual video education with verbal and written material and guidebook.  Patient learns the recommended strategies to achieve and enjoy long-term exercise adherence, including variety, self-motivation, self-efficacy, and positive decision making. Benefits of exercise include fitness, good health, weight management, more energy, better sleep, less stress, and overall well-being.  Medical   Heart Disease Risk Reduction Clinical staff conducted group or individual video education with verbal and written material and guidebook.  Patient learns our heart is our most vital organ as it circulates oxygen, nutrients, white blood cells, and hormones throughout the entire body, and carries waste away. Data supports a plant-based eating plan like the Pritikin Program for its effectiveness in slowing  progression of and reversing heart disease. The video provides a number of recommendations to address heart disease.   Metabolic Syndrome and Belly Fat  Clinical staff conducted group or individual video education with verbal and written material and guidebook.  Patient learns what metabolic syndrome is, how it leads to heart disease, and how one can reverse it and keep it from coming back. You have metabolic syndrome if you have 3 of the following 5 criteria: abdominal obesity, high blood pressure, high triglycerides, low HDL cholesterol, and high blood sugar.  Hypertension and Heart Disease Clinical staff conducted group or individual video education with verbal and written material and guidebook.  Patient learns that high blood pressure, or hypertension, is very common in the [place]. Hypertension is largely due to excessive salt intake, but other important risk factors include being overweight, physical inactivity, drinking too much alcohol, smoking, and  not eating enough potassium from fruits and vegetables. High blood pressure is a leading risk factor for heart attack, stroke, congestive heart failure, dementia, kidney failure, and premature death. Long-term effects of excessive salt intake include stiffening of the arteries and thickening of heart muscle and organ damage. Recommendations include ways to reduce hypertension and the risk of heart disease.  Diseases of Our Time - Focusing on Diabetes Clinical staff conducted group or individual video education with verbal and written material and guidebook.  Patient learns why the best way to stop diseases of our time is prevention, through food and other lifestyle changes. Medicine (such as prescription pills and surgeries) is often only a Band-Aid on the problem, not a long-term solution. Most common diseases of our time include obesity, type 2 diabetes, hypertension, heart disease, and cancer. The Pritikin Program is recommended and has been proven to help reduce, reverse, and/or prevent the damaging effects of metabolic syndrome.  Nutrition   Overview of the Pritikin Eating Plan  Clinical staff conducted group or individual video education with verbal and written material and guidebook.  Patient learns about the Pritikin Eating Plan for disease risk reduction. The Pritikin Eating Plan emphasizes a wide variety of unrefined, minimally-processed carbohydrates, like fruits, vegetables, whole grains, and legumes. Go, Caution, and Stop food choices are explained. Plant-based and lean animal proteins are emphasized. Rationale provided for low sodium intake for blood pressure control, low added sugars for blood sugar stabilization, and low added fats and oils for coronary artery disease risk reduction and weight management.  Calorie Density  Clinical staff conducted group or individual video education with verbal and written material and guidebook.  Patient learns about calorie density and how it impacts  the Pritikin Eating Plan. Knowing the characteristics of the food you choose will help you decide whether those foods will lead to weight gain or weight loss, and whether you want to consume more or less of them. Weight loss is usually a side effect of the Pritikin Eating Plan because of its focus on low calorie-dense foods.  Label Reading  Clinical staff conducted group or individual video education with verbal and written material and guidebook.  Patient learns about the Pritikin recommended label reading guidelines and corresponding recommendations regarding calorie density, added sugars, sodium content, and whole grains.  Dining Out - Part 1  Clinical staff conducted group or individual video education with verbal and written material and guidebook.  Patient learns that restaurant meals can be sabotaging because they can be so high in calories, fat, sodium, and/or sugar. Patient learns recommended strategies on how to positively address  this and avoid unhealthy pitfalls.  Facts on Fats  Clinical staff conducted group or individual video education with verbal and written material and guidebook.  Patient learns that lifestyle modifications can be just as effective, if not more so, as many medications for lowering your risk of heart disease. A Pritikin lifestyle can help to reduce your risk of inflammation and atherosclerosis (cholesterol build-up, or plaque, in the artery walls). Lifestyle interventions such as dietary choices and physical activity address the cause of atherosclerosis. A review of the types of fats and their impact on blood cholesterol levels, along with dietary recommendations to reduce fat intake is also included.  Nutrition Action Plan  Clinical staff conducted group or individual video education with verbal and written material and guidebook.  Patient learns how to incorporate Pritikin recommendations into their lifestyle. Recommendations include planning and keeping personal  health goals in mind as an important part of their success.  Healthy Mind-Set    Healthy Minds, Bodies, Hearts  Clinical staff conducted group or individual video education with verbal and written material and guidebook.  Patient learns how to identify when they are stressed. Video will discuss the impact of that stress, as well as the many benefits of stress management. Patient will also be introduced to stress management techniques. The way we think, act, and feel has an impact on our hearts.  How Our Thoughts Can Heal Our Hearts  Clinical staff conducted group or individual video education with verbal and written material and guidebook.  Patient learns that negative thoughts can cause depression and anxiety. This can result in negative lifestyle behavior and serious health problems. Cognitive behavioral therapy is an effective method to help control our thoughts in order to change and improve our emotional outlook.  Additional Videos:  Exercise    Improving Performance  Clinical staff conducted group or individual video education with verbal and written material and guidebook.  Patient learns to use a non-linear approach by alternating intensity levels and lengths of time spent exercising to help burn more calories and lose more body fat. Cardiovascular exercise helps improve heart health, metabolism, hormonal balance, blood sugar control, and recovery from fatigue. Resistance training improves strength, endurance, balance, coordination, reaction time, metabolism, and muscle mass. Flexibility exercise improves circulation, posture, and balance. Seek guidance from your physician and exercise physiologist before implementing an exercise routine and learn your capabilities and proper form for all exercise.  Introduction to Yoga  Clinical staff conducted group or individual video education with verbal and written material and guidebook.  Patient learns about yoga, a discipline of the coming  together of mind, breath, and body. The benefits of yoga include improved flexibility, improved range of motion, better posture and core strength, increased lung function, weight loss, and positive self-image. Yoga's heart health benefits include lowered blood pressure, healthier heart rate, decreased cholesterol and triglyceride levels, improved immune function, and reduced stress. Seek guidance from your physician and exercise physiologist before implementing an exercise routine and learn your capabilities and proper form for all exercise.  Medical   Aging: Enhancing Your Quality of Life  Clinical staff conducted group or individual video education with verbal and written material and guidebook.  Patient learns key strategies and recommendations to stay in good physical health and enhance quality of life, such as prevention strategies, having an advocate, securing a Health Care Proxy and Power of Attorney, and keeping a list of medications and system for tracking them. It also discusses how to avoid risk for bone  loss.  Biology of Weight Control  Clinical staff conducted group or individual video education with verbal and written material and guidebook.  Patient learns that weight gain occurs because we consume more calories than we burn (eating more, moving less). Even if your body weight is normal, you may have higher ratios of fat compared to muscle mass. Too much body fat puts you at increased risk for cardiovascular disease, heart attack, stroke, type 2 diabetes, and obesity-related cancers. In addition to exercise, following the Pritikin Eating Plan can help reduce your risk.  Decoding Lab Results  Clinical staff conducted group or individual video education with verbal and written material and guidebook.  Patient learns that lab test reflects one measurement whose values change over time and are influenced by many factors, including medication, stress, sleep, exercise, food, hydration,  pre-existing medical conditions, and more. It is recommended to use the knowledge from this video to become more involved with your lab results and evaluate your numbers to speak with your doctor.   Diseases of Our Time - Overview  Clinical staff conducted group or individual video education with verbal and written material and guidebook.  Patient learns that according to the CDC, 50% to 70% of chronic diseases (such as obesity, type 2 diabetes, elevated lipids, hypertension, and heart disease) are avoidable through lifestyle improvements including healthier food choices, listening to satiety cues, and increased physical activity.  Sleep Disorders Clinical staff conducted group or individual video education with verbal and written material and guidebook.  Patient learns how good quality and duration of sleep are important to overall health and well-being. Patient also learns about sleep disorders and how they impact health along with recommendations to address them, including discussing with a physician.  Nutrition  Dining Out - Part 2 Clinical staff conducted group or individual video education with verbal and written material and guidebook.  Patient learns how to plan ahead and communicate in order to maximize their dining experience in a healthy and nutritious manner. Included are recommended food choices based on the type of restaurant the patient is visiting.   Fueling a Banker conducted group or individual video education with verbal and written material and guidebook.  There is a strong connection between our food choices and our health. Diseases like obesity and type 2 diabetes are very prevalent and are in large-part due to lifestyle choices. The Pritikin Eating Plan provides plenty of food and hunger-curbing satisfaction. It is easy to follow, affordable, and helps reduce health risks.  Menu Workshop  Clinical staff conducted group or individual video education  with verbal and written material and guidebook.  Patient learns that restaurant meals can sabotage health goals because they are often packed with calories, fat, sodium, and sugar. Recommendations include strategies to plan ahead and to communicate with the manager, chef, or server to help order a healthier meal.  Planning Your Eating Strategy  Clinical staff conducted group or individual video education with verbal and written material and guidebook.  Patient learns about the Pritikin Eating Plan and its benefit of reducing the risk of disease. The Pritikin Eating Plan does not focus on calories. Instead, it emphasizes high-quality, nutrient-rich foods. By knowing the characteristics of the foods, we choose, we can determine their calorie density and make informed decisions.  Targeting Your Nutrition Priorities  Clinical staff conducted group or individual video education with verbal and written material and guidebook.  Patient learns that lifestyle habits have a tremendous impact on disease  risk and progression. This video provides eating and physical activity recommendations based on your personal health goals, such as reducing LDL cholesterol, losing weight, preventing or controlling type 2 diabetes, and reducing high blood pressure.  Vitamins and Minerals  Clinical staff conducted group or individual video education with verbal and written material and guidebook.  Patient learns different ways to obtain key vitamins and minerals, including through a recommended healthy diet. It is important to discuss all supplements you take with your doctor.   Healthy Mind-Set    Smoking Cessation  Clinical staff conducted group or individual video education with verbal and written material and guidebook.  Patient learns that cigarette smoking and tobacco addiction pose a serious health risk which affects millions of people. Stopping smoking will significantly reduce the risk of heart disease, lung disease,  and many forms of cancer. Recommended strategies for quitting are covered, including working with your doctor to develop a successful plan.  Culinary   Becoming a Set designer conducted group or individual video education with verbal and written material and guidebook.  Patient learns that cooking at home can be healthy, cost-effective, quick, and puts them in control. Keys to cooking healthy recipes will include looking at your recipe, assessing your equipment needs, planning ahead, making it simple, choosing cost-effective seasonal ingredients, and limiting the use of added fats, salts, and sugars.  Cooking - Breakfast and Snacks  Clinical staff conducted group or individual video education with verbal and written material and guidebook.  Patient learns how important breakfast is to satiety and nutrition through the entire day. Recommendations include key foods to eat during breakfast to help stabilize blood sugar levels and to prevent overeating at meals later in the day. Planning ahead is also a key component.  Cooking - Educational psychologist conducted group or individual video education with verbal and written material and guidebook.  Patient learns eating strategies to improve overall health, including an approach to cook more at home. Recommendations include thinking of animal protein as a side on your plate rather than center stage and focusing instead on lower calorie dense options like vegetables, fruits, whole grains, and plant-based proteins, such as beans. Making sauces in large quantities to freeze for later and leaving the skin on your vegetables are also recommended to maximize your experience.  Cooking - Healthy Salads and Dressing Clinical staff conducted group or individual video education with verbal and written material and guidebook.  Patient learns that vegetables, fruits, whole grains, and legumes are the foundations of the Pritikin Eating Plan.  Recommendations include how to incorporate each of these in flavorful and healthy salads, and how to create homemade salad dressings. Proper handling of ingredients is also covered. Cooking - Soups and [employer] - Soups and Desserts Clinical staff conducted group or individual video education with verbal and written material and guidebook.  Patient learns that Pritikin soups and desserts make for easy, nutritious, and delicious snacks and meal components that are low in sodium, fat, sugar, and calorie density, while high in vitamins, minerals, and filling fiber. Recommendations include simple and healthy ideas for soups and desserts.   Overview     The Pritikin Solution Program Overview Clinical staff conducted group or individual video education with verbal and written material and guidebook.  Patient learns that the results of the Pritikin Program have been documented in more than 100 articles published in peer-reviewed journals, and the benefits include reducing risk factors for (and,  in some cases, even reversing) high cholesterol, high blood pressure, type 2 diabetes, obesity, and more! An overview of the three key pillars of the Pritikin Program will be covered: eating well, doing regular exercise, and having a healthy mind-set.  WORKSHOPS  Exercise: Exercise Basics: Building Your Action Plan Clinical staff led group instruction and group discussion with PowerPoint presentation and patient guidebook. To enhance the learning environment the use of posters, models and videos may be added. At the conclusion of this workshop, patients will comprehend the difference between physical activity and exercise, as well as the benefits of incorporating both, into their routine. Patients will understand the FITT (Frequency, Intensity, Time, and Type) principle and how to use it to build an exercise action plan. In addition, safety concerns and other considerations for exercise and cardiac rehab  will be addressed by the presenter. The purpose of this lesson is to promote a comprehensive and effective weekly exercise routine in order to improve patients' overall level of fitness.   Managing Heart Disease: Your Path to a Healthier Heart Clinical staff led group instruction and group discussion with PowerPoint presentation and patient guidebook. To enhance the learning environment the use of posters, models and videos may be added.At the conclusion of this workshop, patients will understand the anatomy and physiology of the heart. Additionally, they will understand how Pritikin's three pillars impact the risk factors, the progression, and the management of heart disease.  The purpose of this lesson is to provide a high-level overview of the heart, heart disease, and how the Pritikin lifestyle positively impacts risk factors.  Exercise Biomechanics Clinical staff led group instruction and group discussion with PowerPoint presentation and patient guidebook. To enhance the learning environment the use of posters, models and videos may be added. Patients will learn how the structural parts of their bodies function and how these functions impact their daily activities, movement, and exercise. Patients will learn how to promote a neutral spine, learn how to manage pain, and identify ways to improve their physical movement in order to promote healthy living. The purpose of this lesson is to expose patients to common physical limitations that impact physical activity. Participants will learn practical ways to adapt and manage aches and pains, and to minimize their effect on regular exercise. Patients will learn how to maintain good posture while sitting, walking, and lifting.  Balance Training and Fall Prevention  Clinical staff led group instruction and group discussion with PowerPoint presentation and patient guidebook. To enhance the learning environment the use of posters, models and videos  may be added. At the conclusion of this workshop, patients will understand the importance of their sensorimotor skills (vision, proprioception, and the vestibular system) in maintaining their ability to balance as they age. Patients will apply a variety of balancing exercises that are appropriate for their current level of function. Patients will understand the common causes for poor balance, possible solutions to these problems, and ways to modify their physical environment in order to minimize their fall risk. The purpose of this lesson is to teach patients about the importance of maintaining balance as they age and ways to minimize their risk of falling.  WORKSHOPS   Nutrition:  Fueling a Ship broker led group instruction and group discussion with PowerPoint presentation and patient guidebook. To enhance the learning environment the use of posters, models and videos may be added. Patients will review the foundational principles of the Pritikin Eating Plan and understand what constitutes a serving size in  each of the food groups. Patients will also learn Pritikin-friendly foods that are better choices when away from home and review make-ahead meal and snack options. Calorie density will be reviewed and applied to three nutrition priorities: weight maintenance, weight loss, and weight gain. The purpose of this lesson is to reinforce (in a group setting) the key concepts around what patients are recommended to eat and how to apply these guidelines when away from home by planning and selecting Pritikin-friendly options. Patients will understand how calorie density may be adjusted for different weight management goals.  Mindful Eating  Clinical staff led group instruction and group discussion with PowerPoint presentation and patient guidebook. To enhance the learning environment the use of posters, models and videos may be added. Patients will briefly review the concepts of the Pritikin  Eating Plan and the importance of low-calorie dense foods. The concept of mindful eating will be introduced as well as the importance of paying attention to internal hunger signals. Triggers for non-hunger eating and techniques for dealing with triggers will be explored. The purpose of this lesson is to provide patients with the opportunity to review the basic principles of the Pritikin Eating Plan, discuss the value of eating mindfully and how to measure internal cues of hunger and fullness using the Hunger Scale. Patients will also discuss reasons for non-hunger eating and learn strategies to use for controlling emotional eating.  Targeting Your Nutrition Priorities Clinical staff led group instruction and group discussion with PowerPoint presentation and patient guidebook. To enhance the learning environment the use of posters, models and videos may be added. Patients will learn how to determine their genetic susceptibility to disease by reviewing their family history. Patients will gain insight into the importance of diet as part of an overall healthy lifestyle in mitigating the impact of genetics and other environmental insults. The purpose of this lesson is to provide patients with the opportunity to assess their personal nutrition priorities by looking at their family history, their own health history and current risk factors. Patients will also be able to discuss ways of prioritizing and modifying the Pritikin Eating Plan for their highest risk areas  Menu  Clinical staff led group instruction and group discussion with PowerPoint presentation and patient guidebook. To enhance the learning environment the use of posters, models and videos may be added. Using menus brought in from [employer], or printed from [employer], patients will apply the Pritikin dining out guidelines that were presented in the [employer] video. Patients will also be able to practice these guidelines  in a variety of provided scenarios. The purpose of this lesson is to provide patients with the opportunity to practice hands-on learning of the Pritikin Dining Out guidelines with actual menus and practice scenarios.  Label Reading Clinical staff led group instruction and group discussion with PowerPoint presentation and patient guidebook. To enhance the learning environment the use of posters, models and videos may be added. Patients will review and discuss the Pritikin label reading guidelines presented in [employer]'s Label Reading Educational series video. Using fool labels brought in from local grocery stores and markets, patients will apply the label reading guidelines and determine if the packaged food meet the Pritikin guidelines. The purpose of this lesson is to provide patients with the opportunity to review, discuss, and practice hands-on learning of the Pritikin Label Reading guidelines with actual packaged food labels. Cooking School  P[employer]'s [employer] are designed to teach patients ways to prepare quick, simple,  and affordable recipes at home. The importance of nutrition's role in chronic disease risk reduction is reflected in its emphasis in the overall Pritikin program. By learning how to prepare essential core Pritikin Eating Plan recipes, patients will increase control over what they eat; be able to customize the flavor of foods without the use of added salt, sugar, or fat; and improve the quality of the food they consume. By learning a set of core recipes which are easily assembled, quickly prepared, and affordable, patients are more likely to prepare more healthy foods at home. These workshops focus on convenient breakfasts, simple entres, side dishes, and desserts which can be prepared with minimal effort and are consistent with nutrition recommendations for cardiovascular risk reduction. Cooking [employer] are taught by a Armed forces logistics/support/administrative officer (RD) who has  been trained by the [employer]. The chef or RD has a clear understanding of the importance of minimizing - if not completely eliminating - added fat, sugar, and sodium in recipes. Throughout the series of Cooking School Workshop sessions, patients will learn about healthy ingredients and efficient methods of cooking to build confidence in their capability to prepare    Cooking School weekly topics:  Adding Flavor- Sodium-Free  Fast and Healthy Breakfasts  Powerhouse Plant-Based Proteins  Satisfying Salads and Dressings  Simple Sides and Sauces  International Cuisine-Spotlight on the [employer] Zones  Delicious Desserts  Savory Soups  [employer] - Meals in a Astronomer Appetizers and Snacks  Comforting Weekend Breakfasts  One-Pot Wonders   Fast Evening Meals  Landscape architect Your Pritikin Plate  WORKSHOPS   Healthy Mindset (Psychosocial):  Focused Goals, Sustainable Changes Clinical staff led group instruction and group discussion with PowerPoint presentation and patient guidebook. To enhance the learning environment the use of posters, models and videos may be added. Patients will be able to apply effective goal setting strategies to establish at least one personal goal, and then take consistent, meaningful action toward that goal. They will learn to identify common barriers to achieving personal goals and develop strategies to overcome them. Patients will also gain an understanding of how our mind-set can impact our ability to achieve goals and the importance of cultivating a positive and growth-oriented mind-set. The purpose of this lesson is to provide patients with a deeper understanding of how to set and achieve personal goals, as well as the tools and strategies needed to overcome common obstacles which may arise along the way.  From Head to Heart: The Power of a Healthy Outlook  Clinical staff led group instruction and group discussion with  PowerPoint presentation and patient guidebook. To enhance the learning environment the use of posters, models and videos may be added. Patients will be able to recognize and describe the impact of emotions and mood on physical health. They will discover the importance of self-care and explore self-care practices which may work for them. Patients will also learn how to utilize the 4 C's to cultivate a healthier outlook and better manage stress and challenges. The purpose of this lesson is to demonstrate to patients how a healthy outlook is an essential part of maintaining good health, especially as they continue their cardiac rehab journey.  Healthy Sleep for a Healthy Heart Clinical staff led group instruction and group discussion with PowerPoint presentation and patient guidebook. To enhance the learning environment the use of posters, models and videos may be added. At the conclusion of this workshop, patients will be able to  demonstrate knowledge of the importance of sleep to overall health, well-being, and quality of life. They will understand the symptoms of, and treatments for, common sleep disorders. Patients will also be able to identify daytime and nighttime behaviors which impact sleep, and they will be able to apply these tools to help manage sleep-related challenges. The purpose of this lesson is to provide patients with a general overview of sleep and outline the importance of quality sleep. Patients will learn about a few of the most common sleep disorders. Patients will also be introduced to the concept of "sleep hygiene," and discover ways to self-manage certain sleeping problems through simple daily behavior changes. Finally, the workshop will motivate patients by clarifying the links between quality sleep and their goals of heart-healthy living.   Recognizing and Reducing Stress Clinical staff led group instruction and group discussion with PowerPoint presentation and patient guidebook. To  enhance the learning environment the use of posters, models and videos may be added. At the conclusion of this workshop, patients will be able to understand the types of stress reactions, differentiate between acute and chronic stress, and recognize the impact that chronic stress has on their health. They will also be able to apply different coping mechanisms, such as reframing negative self-talk. Patients will have the opportunity to practice a variety of stress management techniques, such as deep abdominal breathing, progressive muscle relaxation, and/or guided imagery.  The purpose of this lesson is to educate patients on the role of stress in their lives and to provide healthy techniques for coping with it.  Learning Barriers/Preferences:  Learning Barriers/Preferences - 02/26/23 1215       Learning Barriers/Preferences   Learning Barriers Sight   wears contacts   Learning Preferences Video;Pictoral             Education Topics:  Knowledge Questionnaire Score:  Knowledge Questionnaire Score - 02/26/23 0848       Knowledge Questionnaire Score   Pre Score 19/24             Core Components/Risk Factors/Patient Goals at Admission:  Personal Goals and Risk Factors at Admission - 02/26/23 0838       Core Components/Risk Factors/Patient Goals on Admission   Tobacco Cessation Yes    Intervention Offer self-teaching materials, assist with locating and accessing local/national Quit Smoking programs, and support quit date choice.    Expected Outcomes Long Term: Complete abstinence from all tobacco products for at least 12 months from quit date.;Short Term: Will quit all tobacco product use, adhering to prevention of relapse plan.    Lipids Yes    Intervention Provide education and support for participant on nutrition & aerobic/resistive exercise along with prescribed medications to achieve LDL 70mg , HDL >40mg .    Expected Outcomes Short Term: Participant states understanding of  desired cholesterol values and is compliant with medications prescribed. Participant is following exercise prescription and nutrition guidelines.;Long Term: Cholesterol controlled with medications as prescribed, with individualized exercise RX and with personalized nutrition plan. Value goals: LDL < 70mg , HDL > 40 mg.             Core Components/Risk Factors/Patient Goals Review:   Goals and Risk Factor Review     Row Name 03/05/23 0851 04/02/23 1016           Core Components/Risk Factors/Patient Goals Review   Personal Goals Review Weight Management/Obesity;Lipids Weight Management/Obesity;Lipids      Review Long started cardiac rehab on 03/04/23. Quanell did well wiht exercise. Vital signs  were stable. Vonn is doing well with  exercise. Vital signs have been  stable. Dressing dry  intact to right chest.      Expected Outcomes Aristide will continue to participate in cardiac rehab for exercise, nutrition and lifestyle modifications Aristotle will continue to participate in cardiac rehab for exercise, nutrition and lifestyle modifications               Core Components/Risk Factors/Patient Goals at Discharge (Final Review):   Goals and Risk Factor Review - 04/02/23 1016       Core Components/Risk Factors/Patient Goals Review   Personal Goals Review Weight Management/Obesity;Lipids    Review Raelyn is doing well with  exercise. Vital signs have been  stable. Dressing dry  intact to right chest.    Expected Outcomes Maxey will continue to participate in cardiac rehab for exercise, nutrition and lifestyle modifications             ITP Comments:  ITP Comments     Row Name 02/26/23 0815 03/05/23 0847 04/02/23 1014       ITP Comments Medical Director- Dr. Armanda Magic, MD. Introduction to the Pritikin Education Program / Intensive Cardiac Rehab. Review of initial orientation folder. 30 Day ITP REview. Jonas started cardiac rehab on 03/04/23 and did well with exercise. 30 Day ITP REview. Reddick  has good attendance and participation with exercise at cardiac rehab..              Comments: See ITP comments.Thayer Headings RN BSN

## 2023-04-03 ENCOUNTER — Encounter (HOSPITAL_COMMUNITY)
Admission: RE | Admit: 2023-04-03 | Discharge: 2023-04-03 | Disposition: A | Discharge status: Home / Self Care | Payer: 59 | Source: Ambulatory Visit | Attending: Cardiovascular Disease | Admitting: Cardiovascular Disease

## 2023-04-03 DIAGNOSIS — I341 Nonrheumatic mitral (valve) prolapse: Secondary | ICD-10-CM | POA: Diagnosis not present

## 2023-04-03 DIAGNOSIS — Z9889 Other specified postprocedural states: Secondary | ICD-10-CM

## 2023-04-05 ENCOUNTER — Encounter (HOSPITAL_COMMUNITY)
Admission: RE | Admit: 2023-04-05 | Discharge: 2023-04-05 | Disposition: A | Discharge status: Home / Self Care | Payer: 59 | Source: Ambulatory Visit | Attending: Cardiovascular Disease | Admitting: Cardiovascular Disease

## 2023-04-05 DIAGNOSIS — Z9889 Other specified postprocedural states: Secondary | ICD-10-CM

## 2023-04-05 DIAGNOSIS — I341 Nonrheumatic mitral (valve) prolapse: Secondary | ICD-10-CM | POA: Diagnosis not present

## 2023-04-05 NOTE — Progress Notes (Signed)
Christopher Tanner 67 y.o. male  Christopher Tanner is motivated to make lifestyle changes to aid with intensive cardiac rehab. Patient has medical history of mitral valve repair at the [facility], hyperlipidemia. He typically eats a diet rich in fruits/vegetables, whole grains, and lean protein. He does practice intermittent fasting 1-2 days per week reducing calories to ~600kcals to aid with weight maintenance. He does eat out on a regular basis and consume alcohol socially. He continues crestor and LDL remains <100. He has questions regarding weight loss/calorie needs, dietary goals for cardiac health, intermittent fasting, etc. He is interested in weight loss of ~5-10#.  RMR: [id number] ((x1.3-1.5) [id number]  BMI, Weight: 88.2kg BMI 24.43  Labs: cholesterol 181, triglycerides 146, LDL 90, HDL 66   Nutrition Diagnosis (In action) Food-and nutrition-related knowledge deficit related to lack of exposure to information as related to diagnosis of hyperlipidemia, mitral valve repair.   Nutrition Intervention Pt's individual nutrition plan reviewed with pt. Benefits of adopting Heart Healthy diet discussed.  Continue client-centered nutrition education by RD, as part of interdisciplinary care.  Monitor/Evaluation: Patient reports motivation to make lifestyle changes for adherence to heart healthy diet recommendation and weight management. We discussed current weight/BMI, strategies for weight loss, fiber intake, and most recent labs. Answered questions regarding sodium intake and intermittent fasting. Handouts/notes given. Patient amicable to RD suggestions and verbalizes understanding. Will follow-up as needed.   50 minutes spent in review of topics related to a heart healthy diet including sodium intake, blood sugar control, weight management, label reading, hydration, and fiber intake. Start Time: 8:10 End Time: 9:00  Goal(s) Patient to limit saturated fat and trans fat and prioritize fish, poultry,  nonfat dairy, and vegetarian protein sources 2.   Patient to increase fiber intake (25-35g/day) through whole grains, fruits, and vegetables. Patient to limit refined carbohydrates, simple sugars. 3.   Patient to prioritize water and other zero calorie alternatives; limit/eliminate any sugar beverages. Continue to be mindful of calories from alcohol. 4. Patient to identify high sodium food choices and reduce sodium intake to <2300 mg 6. Patient to identify strategies and food quantities to achieve weight/weight gain of 0.5-2.0# per week. May consider intermittent fasting (16:8 schedule), tracking calories/macro-nutrients, and using the plate method as a guide for meal planning.   Plan:  Will provide client-centered nutrition education as part of interdisciplinary care Monitor and evaluate progress toward nutrition goal with team. Patient given the option to attend Pritikin education, cooking workshops, and educational videos   Christopher Reitter [place], MS, RDN, LDN

## 2023-04-08 ENCOUNTER — Encounter: Payer: Self-pay | Admitting: Podiatry

## 2023-04-08 ENCOUNTER — Ambulatory Visit (INDEPENDENT_AMBULATORY_CARE_PROVIDER_SITE_OTHER): Payer: 59

## 2023-04-08 ENCOUNTER — Ambulatory Visit (INDEPENDENT_AMBULATORY_CARE_PROVIDER_SITE_OTHER): Payer: 59 | Admitting: Podiatry

## 2023-04-08 ENCOUNTER — Encounter (HOSPITAL_COMMUNITY)
Admission: RE | Admit: 2023-04-08 | Discharge: 2023-04-08 | Disposition: A | Discharge status: Home / Self Care | Payer: 59 | Source: Ambulatory Visit | Attending: Cardiovascular Disease | Admitting: Cardiovascular Disease

## 2023-04-08 VITALS — BP 122/77 | HR 65

## 2023-04-08 DIAGNOSIS — I341 Nonrheumatic mitral (valve) prolapse: Secondary | ICD-10-CM | POA: Diagnosis not present

## 2023-04-08 DIAGNOSIS — Z9889 Other specified postprocedural states: Secondary | ICD-10-CM

## 2023-04-08 DIAGNOSIS — M722 Plantar fascial fibromatosis: Secondary | ICD-10-CM

## 2023-04-08 MED ORDER — TRIAMCINOLONE ACETONIDE 10 MG/ML IJ SUSP
10.0000 mg | Freq: Once | INTRAMUSCULAR | Status: AC
Start: 2023-04-08 — End: 2023-04-08
  Administered 2023-04-08: 10 mg via INTRA_ARTICULAR

## 2023-04-08 NOTE — Progress Notes (Signed)
Subjective:   Patient ID: Christopher Tanner, male   DOB: 67 y.o.   MRN: [id number]   HPI Patient presents severe pain in the left plantar heel at the insertional point tendon calcaneus with inflammation fluid around the medial band several weeks duration and had a valve replacement 10 weeks ago and has been doing a different exercise pattern.  Patient does not smoke likes to be active   Review of Systems  All other systems reviewed and are negative.       Objective:  Physical Exam Vitals and nursing note reviewed.  Constitutional:      Appearance: He is well-developed.  Pulmonary:     Effort: Pulmonary effort is normal.  Musculoskeletal:        General: Normal range of motion.  Skin:    General: Skin is warm.  Neurological:     Mental Status: He is alert.     Neurovascular status intact muscle strength adequate range of motion within normal limits with patient noted to have exquisite discomfort in the medial and central band of the left plantar fascia painful when pressed with good digital perfusion well oriented Assessment:  Acute plantar fasciitis left with change in activity levels over the last 10 weeks     Plan:  H&P x-rays reviewed sterile prep injected the fascia at insertion 3 mg Kenalog 5 mg Xylocaine instructed on stretch therapy patient's discharge will be seen back as needed  X-rays indicate small spur no indication stress fracture arthritis

## 2023-04-10 ENCOUNTER — Encounter (HOSPITAL_COMMUNITY)
Admission: RE | Admit: 2023-04-10 | Discharge: 2023-04-10 | Disposition: A | Discharge status: Home / Self Care | Payer: 59 | Source: Ambulatory Visit | Attending: Cardiovascular Disease | Admitting: Cardiovascular Disease

## 2023-04-10 DIAGNOSIS — Z9889 Other specified postprocedural states: Secondary | ICD-10-CM | POA: Diagnosis present

## 2023-04-10 DIAGNOSIS — Z5189 Encounter for other specified aftercare: Secondary | ICD-10-CM | POA: Diagnosis not present

## 2023-04-12 ENCOUNTER — Encounter (HOSPITAL_COMMUNITY)
Admission: RE | Admit: 2023-04-12 | Discharge: 2023-04-12 | Disposition: A | Discharge status: Home / Self Care | Payer: 59 | Source: Ambulatory Visit | Attending: Cardiovascular Disease | Admitting: Cardiovascular Disease

## 2023-04-12 DIAGNOSIS — Z9889 Other specified postprocedural states: Secondary | ICD-10-CM

## 2023-04-15 ENCOUNTER — Encounter (HOSPITAL_COMMUNITY): Payer: 59

## 2023-04-15 ENCOUNTER — Telehealth (HOSPITAL_COMMUNITY): Payer: Self-pay

## 2023-04-15 NOTE — Telephone Encounter (Signed)
-----   Message from Kristeen Miss sent at 04/15/2023 10:11 AM EDT ----- Regarding: RE: THRR INC and HIIT OK to increase target HR as requested   PN ----- Message ----- From: Harrie Jeans Sent: 04/15/2023   8:33 AM EDT To: [name], MD Subject: Melvern Banker and HIIT                              CARDIAC REHAB PHASE 2  Christopher Tanner has been in rehab approximately 6 weeks and is doing well.  Blood pressure is within normal limits. He is now interested in HIIT training intervals on the elliptical.  He states, on his own he will occasionally exceed exercise Target Heart Rate Range(THRR) of 62-123 bpm (40%-80% of age predicted max HR).  If no GXT is planned for the near future and MD agrees, request to increase THR to 40% -90% of age predicted max HR 62-138 bpm. Let me know if these changes to his exercise prescription sound agreeable.   Thanks for your assistance,  Harrie Jeans ACSM-CEP 04/15/2023 8:30 AM

## 2023-04-17 ENCOUNTER — Encounter (HOSPITAL_COMMUNITY): Payer: 59

## 2023-04-18 ENCOUNTER — Other Ambulatory Visit: Payer: 59

## 2023-04-19 ENCOUNTER — Ambulatory Visit: Payer: 59 | Admitting: Cardiovascular Disease

## 2023-04-19 ENCOUNTER — Encounter (HOSPITAL_COMMUNITY): Payer: 59

## 2023-04-22 ENCOUNTER — Encounter (HOSPITAL_COMMUNITY): Payer: 59

## 2023-04-24 ENCOUNTER — Ambulatory Visit: Payer: 59 | Admitting: Podiatry

## 2023-04-24 ENCOUNTER — Encounter (HOSPITAL_COMMUNITY)
Admission: RE | Admit: 2023-04-24 | Discharge: 2023-04-24 | Disposition: A | Discharge status: Home / Self Care | Payer: 59 | Source: Ambulatory Visit | Attending: Cardiovascular Disease | Admitting: Cardiovascular Disease

## 2023-04-24 ENCOUNTER — Ambulatory Visit: Payer: 59 | Attending: Cardiovascular Disease

## 2023-04-24 DIAGNOSIS — E782 Mixed hyperlipidemia: Secondary | ICD-10-CM

## 2023-04-24 DIAGNOSIS — I341 Nonrheumatic mitral (valve) prolapse: Secondary | ICD-10-CM

## 2023-04-24 DIAGNOSIS — Z79899 Other long term (current) drug therapy: Secondary | ICD-10-CM

## 2023-04-24 DIAGNOSIS — Z9889 Other specified postprocedural states: Secondary | ICD-10-CM

## 2023-04-25 LAB — BASIC METABOLIC PANEL
BUN/Creatinine Ratio: 15 (ref 10–24)
BUN: 16 mg/dL (ref 8–27)
CO2: 25 mmol/L (ref 20–29)
Calcium: 9.1 mg/dL (ref 8.6–10.2)
Chloride: 99 mmol/L (ref 96–106)
Creatinine, Ser: 1.07 mg/dL (ref 0.76–1.27)
Glucose: 84 mg/dL (ref 70–99)
Potassium: 4.8 mmol/L (ref 3.5–5.2)
Sodium: 137 mmol/L (ref 134–144)
eGFR: 76 mL/min/{1.73_m2} (ref >59)

## 2023-04-25 LAB — CBC
Hematocrit: 46.8 % (ref 37.5–51.0)
Hemoglobin: 15.1 g/dL (ref 13.0–17.7)
MCH: 28.5 pg (ref 26.6–33.0)
MCHC: 32.3 g/dL (ref 31.5–35.7)
MCV: 89 fL (ref 79–97)
Platelets: 226 10*3/uL (ref 150–450)
RBC: 5.29 x10E6/uL (ref 4.14–5.80)
RDW: 14.4 % (ref 11.6–15.4)
WBC: 6.7 10*3/uL (ref 3.4–10.8)

## 2023-04-25 NOTE — Progress Notes (Signed)
Cardiology Office Note   Date:  04/29/2023   ID:  Christopher Tanner, Christopher Tanner 06-Dec-1955, MRN [id number]  PCP:  Garlan Fillers, MD  Cardiologist:   Kristeen Miss, MD   Chief Complaint  Patient presents with   mitral valve repair    1. Hyperlipidemia 2. Mitral valve prolapse    Christopher Tanner is a 67 y.o.  gentleman with a history of hyperlipidemia and mitral valve prolapse. He's done very well from a cardiac standpoint.    He has not had any episodes of chest pain or shortness of breath. He is exercising on a regular basis.  Feb. 5, 2014: He is doing well.  He has had some shoulder issues and is having to scale back his exercise.        October 05, 2014:  Christopher Tanner is a 67 y.o. male who presents for his MVP. No CP , no dyspnea.  No symptoms related to MVP,   October 18, 2015:  Doing great.   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 Getting over a head cold several days.  October 31, 2016:  Christopher Tanner is seen today for his yearly visit.  He has a hx of hyperlipidemia  Exercising some .  Spin class on occasion.   Nov 22, 2017:  Christopher Tanner is seen today for follow-up visit.  He has a history of mitral valve prolapse and hyperlipidemia.  Feeling well , no dyspnea, no dizziness,  Goes to spin class.  Hikes, walks, jogs once a week.   Feb. 11, 2021:  Doing well .   Had some leg cramps with rosuvastatin  He is now on rosuvastatin 3 days a week.   Father had his first MI at 73,  Then died at age 80.  Christopher Tanner is doing well  We discussed doing a coronary calcium score.  Feb. 15, 2022:  Christopher Tanner is seen today for follow up of his MVP and HLD . Echo from Feb. 14, shows severe prolapse of his posterior leaflet of the MV with at least moderate MR Coronary calcium score was 2 agatston units - 26th for age / sex matched controls. No dyspnea. Has some numbness in his feet at night  We discussed TEE .  Is doing intermittant fasting   Nov 06, 2021:  Christopher Tanner is seen today for follow up of MVP Just got back from a 2  week [place] [employer] [facility], a break out group )   No CP , no dyspnea  Rare episodes of vertigo  Still working out on his [place], is no longer running    Aug. 6, 2024 Christopher Tanner has had MV repair at the [facility] ( January 23, 2023)  since his last office visit  He is here to follow up post operative follow up  Had  Divinci robotic repair of his MV  Is on ASA  Has remained in NSR   Had a suture coming through through his skin  The suture was bathed in Betadine and snipped off to the skin.  His other wounds look like they are healing very well.  Will enroll him in cardiac rehab  Wrote for SBE prophylaxis  Oct. 21, 2024 Christopher Tanner is seen for follow up of his MV repair  He had a seroma in his right groin  suture material was removed from his chest wounds   Feeling well    Past Medical History:  Diagnosis Date   Anal fistula    Hyperlipidemia  on meds   Mitral valve prolapse    Last echocardiogram July, 2008. Moderate MVP of the posterior leaflet. Moderate mitral regurgitation. Normal left ventricular systolic function.   Numbness in feet    Plantar fasciitis    right    Past Surgical History:  Procedure Laterality Date   BUBBLE STUDY  09/08/2020   Procedure: BUBBLE STUDY;  Surgeon: [name], MD;  Location: [facility] ENDOSCOPY;  Service: Cardiovascular;;   COLONOSCOPY  03/06/2021   (DP-hems/normal.  2012), MS at [facility]   hand broken Left    Left hand bones- 4 different times   KNEE ARTHROSCOPY Left 2014   meniscectomy   s/p Robotic Mitral Valve Repair  01/23/2023   At the [facility]   SHOULDER SURGERY Left 12/2016   arthroscopy, biceps tendon repair   TEE WITHOUT CARDIOVERSION N/A 09/08/2020   Procedure: TRANSESOPHAGEAL ECHOCARDIOGRAM (TEE);  Surgeon: Elease Hashimoto Deloris Ping, MD;  Location: [facility] ENDOSCOPY;  Service: Cardiovascular;  Laterality: N/A;   TONSILLECTOMY     TREATMENT FISTULA ANAL  04/2011   WISDOM TOOTH EXTRACTION       Current  Outpatient Medications  Medication Sig Dispense Refill   acetaminophen (TYLENOL) 325 MG tablet as needed.     aspirin 81 MG tablet Take 81 mg by mouth daily.     carboxymethylcellulose (REFRESH PLUS) 0.5 % SOLN Place 1 drop into both eyes 2 (two) times daily.     ibuprofen (ADVIL) 200 MG tablet as needed.     metoprolol tartrate (LOPRESSOR) 25 MG tablet Take 0.5 tablets (12.5 mg total) by mouth 2 (two) times daily. 90 tablet 3   Multiple Vitamins-Minerals (MULTIVITAL PERFORMANCE) TABS Take 1 tablet by mouth daily.     psyllium (METAMUCIL SMOOTH TEXTURE) 28 % packet Take 1 packet by mouth at bedtime.     rosuvastatin (CRESTOR) 5 MG tablet TAKE 1 TABLET (5 MG TOTAL) BY MOUTH DAILY. (Patient taking differently: Take 5 mg by mouth every other day.) 30 tablet 0   sildenafil (REVATIO) 20 MG tablet daily as needed.     amoxicillin (AMOXIL) 500 MG capsule Take 4 capsules by mouth one hour prior to dental cleanings/procedures (Patient not taking: Reported on 03/21/2023) 16 capsule 4   Current Facility-Administered Medications  Medication Dose Route Frequency Provider Last Rate Last Admin   0.9 %  sodium chloride infusion  500 mL Intravenous Once Meryl Dare, MD        Allergies:   Ezetimibe-simvastatin, Simvastatin, and Niaspan [niacin]    Social History:  The patient  reports that he has been smoking cigars. He has never used smokeless tobacco. He reports current alcohol use. He reports that he does not use drugs.   Family History:  The patient's family history includes Diabetes in his mother; Fibromyalgia in his mother; Heart disease in his father; Uterine cancer in his mother.    ROS: Noted in current history, otherwise review of systems is negative.   Physical Exam: Blood pressure 102/62, pulse 80, height 6\' 2"  (1.88 m), weight 189 lb 9.6 oz (86 kg), SpO2 97%.       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  Minimal tenderness.  SKIN: Warm and dry NEUROLOGIC:  Alert and oriented x 3   EKG:         Recent Labs: 04/24/2023: BUN 16; Creatinine, Ser 1.07; Hemoglobin 15.1; Platelets 226; Potassium 4.8; Sodium 137    Lipid Panel    Component Value Date/Time   CHOL 181 10/16/2021 0733   CHOL 172 09/20/2014 0828   TRIG 146 10/16/2021 0733   TRIG 107 09/20/2014 0828   HDL 66 10/16/2021 0733   HDL 58 09/20/2014 0828   CHOLHDL 2.7 10/16/2021 0733   CHOLHDL 2.1 10/10/2015 0825   VLDL 13 10/10/2015 0825   LDLCALC 90 10/16/2021 0733   LDLCALC 93 09/20/2014 0828      Wt Readings from Last 3 Encounters:  04/29/23 189 lb 9.6 oz (86 kg)  03/21/23 200 lb 9.6 oz (91 kg)  02/26/23 195 lb 5.2 oz (88.6 kg)      Other studies Reviewed: Additional studies/ records that were reviewed today include: . Review of the above records demonstrates:    ASSESSMENT AND PLAN:  1.  Mitral valve prolapse:     Status post surgical repair at the [facility] clinic.  He is overall doing very well.  He has maintained sinus rhythm.  Will refill his metoprolol.  He has a small to medium sized seroma in his right groin associated with his previous heart lung bypass access.  He was seen by vascular surgery but they did not want to drain the wound.  They suggested that he could have it drained in interventional radiology.  At this point I think that the wound is stable and the seroma will eventually resolve on its own.  He will give [place] a call if it has any changes. Will DC metoprolol.  Get an echocardiogram to see what his baseline post valve repair looks like.  I will see him again in 6 months.     2. Hyperlipidemia: Continue  rosuvastatin 5 mg 3 days a week.  Start coenzyme every 10 once a day.  Current medicines are reviewed at length with the patient today.  The patient does not have concerns regarding medicines.  The following changes have been made:   Labs/ tests ordered today include:  No orders of the defined types were placed in this encounter.    Disposition:       Kristeen Miss, MD  04/29/2023 8:18 AM    [facility] [address] [place], [place], [place]  [address] Phone: [phone]; Fax: [phone]

## 2023-04-26 ENCOUNTER — Encounter (HOSPITAL_COMMUNITY)
Admission: RE | Admit: 2023-04-26 | Discharge: 2023-04-26 | Disposition: A | Discharge status: Home / Self Care | Payer: 59 | Source: Ambulatory Visit | Attending: Cardiovascular Disease | Admitting: Cardiovascular Disease

## 2023-04-26 DIAGNOSIS — Z9889 Other specified postprocedural states: Secondary | ICD-10-CM | POA: Diagnosis not present

## 2023-04-26 NOTE — Progress Notes (Signed)
Cardiac Individual Treatment Plan  Patient Details  Name: Christopher Tanner MRN: [id number] Date of Birth: 1956-03-01 Referring Provider:   Flowsheet Row INTENSIVE CARDIAC REHAB ORIENT from 02/26/2023 in [facility]  Referring Provider Nahser, Deloris Ping, MD.       Initial Encounter Date:  Flowsheet Row INTENSIVE CARDIAC REHAB ORIENT from 02/26/2023 in [facility]  Date 02/26/23       Visit Diagnosis: 01/23/23 MVR (mitral valve repair) at the [facility]  Patient's Home Medications on Admission:  Current Outpatient Medications:    acetaminophen (TYLENOL) 325 MG tablet, as needed., Disp: , Rfl:    amoxicillin (AMOXIL) 500 MG capsule, Take 4 capsules by mouth one hour prior to dental cleanings/procedures (Patient not taking: Reported on 03/21/2023), Disp: 16 capsule, Rfl: 4   aspirin 81 MG tablet, Take 81 mg by mouth daily., Disp: , Rfl:    carboxymethylcellulose (REFRESH PLUS) 0.5 % SOLN, Place 1 drop into both eyes 2 (two) times daily., Disp: , Rfl:    ibuprofen (ADVIL) 200 MG tablet, as needed., Disp: , Rfl:    metoprolol tartrate (LOPRESSOR) 25 MG tablet, Take 0.5 tablets (12.5 mg total) by mouth 2 (two) times daily., Disp: 90 tablet, Rfl: 3   Multiple Vitamins-Minerals (MULTIVITAL PERFORMANCE) TABS, Take 1 tablet by mouth daily., Disp: , Rfl:    psyllium (METAMUCIL SMOOTH TEXTURE) 28 % packet, Take 1 packet by mouth at bedtime., Disp: , Rfl:    rosuvastatin (CRESTOR) 5 MG tablet, TAKE 1 TABLET (5 MG TOTAL) BY MOUTH DAILY. (Patient taking differently: Take 5 mg by mouth every other day.), Disp: 30 tablet, Rfl: 0   sildenafil (REVATIO) 20 MG tablet, daily as needed., Disp: , Rfl:   Current Facility-Administered Medications:    0.9 %  sodium chloride infusion, 500 mL, Intravenous, Once, Christopher Dare, MD  Past Medical History: Past Medical History:  Diagnosis Date    Anal fistula    Hyperlipidemia    on meds   Mitral valve prolapse    Last echocardiogram July, 2008. Moderate MVP of the posterior leaflet. Moderate mitral regurgitation. Normal left ventricular systolic function.   Numbness in feet    Plantar fasciitis    right    Tobacco Use: Social History   Tobacco Use  Smoking Status Light Smoker   Types: Cigars  Smokeless Tobacco Never  Tobacco Comments   Occasional,  " smokes cigars at weddings    Labs: Review Flowsheet  More data exists      Latest Ref Rng & Units 11/22/2017 02/24/2019 06/03/2019 08/19/2020 10/16/2021  Labs for ITP Cardiac and Pulmonary Rehab  Cholestrol 100 - 199 mg/dL 244  010  272  536  644   LDL (calc) 0 - 99 mg/dL 95  034  92  87  90   HDL-C >39 mg/dL 66  61  62  65  66   Trlycerides 0 - 149 mg/dL 742  595  99  91  638     Details            Capillary Blood Glucose: No results found for: "GLUCAP"   Exercise Target Goals: Exercise Program Goal: Individual exercise prescription set using results from initial 6 min walk test and THRR while considering  patient's activity barriers and safety.   Exercise Prescription Goal: Initial exercise prescription builds to 30-45 minutes a day of aerobic activity, 2-3 days  per week.  Home exercise guidelines will be given to patient during program as part of exercise prescription that the participant will acknowledge.  Activity Barriers & Risk Stratification:  Activity Barriers & Cardiac Risk Stratification - 02/26/23 0901       Activity Barriers & Cardiac Risk Stratification   Activity Barriers Other (comment)    Comments Numbness- both feet, plantar fasciitis- right foot, h/x fx x4 - left hand, left knee arthroscopy/ meniscectomy, left shoulder arthroscopy/ biceps tendon repair.    Cardiac Risk Stratification Low             6 Minute Walk:  6 Minute Walk     Row Name 02/26/23 0930         6 Minute Walk   Phase Initial     Distance 1805 feet      Walk Time 6 minutes     # of Rest Breaks 0     MPH 3.42     METS 4.17     RPE 11     Perceived Dyspnea  0     VO2 Peak 14.61     Symptoms No     Resting HR 67 bpm     Resting BP 102/70     Resting Oxygen Saturation  98 %     Exercise Oxygen Saturation  during 6 min walk 97 %     Max Ex. HR 79 bpm     Max Ex. BP 140/78     2 Minute Post BP 116/78              Oxygen Initial Assessment:   Oxygen Re-Evaluation:   Oxygen Discharge (Final Oxygen Re-Evaluation):   Initial Exercise Prescription:  Initial Exercise Prescription - 02/26/23 1000       Date of Initial Exercise RX and Referring Provider   Date 02/26/23    Referring Provider Nahser, Deloris Ping, MD.    Expected Discharge Date 05/22/23      Bike   Level 3    Watts 60    Minutes 15    METs 4.2      Elliptical   Level 1    Speed 1    Minutes 15    METs 4.2      Prescription Details   Frequency (times per week) 3    Duration Progress to 30 minutes of continuous aerobic without signs/symptoms of physical distress      Intensity   THRR 40-80% of Max Heartrate 62-123    Ratings of Perceived Exertion 11-13    Perceived Dyspnea 0-4      Progression   Progression Continue to progress workloads to maintain intensity without signs/symptoms of physical distress.      Resistance Training   Training Prescription Yes    Weight 5 lbs    Reps 10-15             Perform Capillary Blood Glucose checks as needed.  Exercise Prescription Changes:   Exercise Prescription Changes     Row Name 03/04/23 1200 03/18/23 1200 04/01/23 1100 04/26/23 1100       Response to Exercise   Blood Pressure (Admit) 102/62 112/70 130/70 110/70    Blood Pressure (Exercise) 138/78 138/7 132/68 --    Blood Pressure (Exit) 104/60 94/54 106/64 114/79    Heart Rate (Admit) 68 bpm 78 bpm 69 bpm 75 bpm    Heart Rate (Exercise) 103 bpm 116 bpm 100 bpm 134 bpm    Heart Rate (  Exit) 102 bpm 86 bpm 78 bpm 84 bpm    Rating of  Perceived Exertion (Exercise) 12 13 11 13     Perceived Dyspnea (Exercise) 0 -- -- --    Symptoms none None None None    Comments pt first day Reviewed METs Discussed METs, goals, and home ExRx with pt. Pt voiced understanding of METs. [name]'s goals are to return to cycling and hiking as he has started his cycling and hiking again. He has not had signs/symptoms when cycling or hiking. He monitors his heart rate while doing both and is not going out of THR. Lanorris is meeting exercise guidlines as he cycles, walks, or hikes 5-7 days/wk for at least 30 min/day. Discussed safetly guidelines and weather restrictions. Calogero voiced understanding. Peak METs 7.8. Reviewed METs and goals    Duration Continue with 30 min of aerobic exercise without signs/symptoms of physical distress. Continue with 30 min of aerobic exercise without signs/symptoms of physical distress. Continue with 30 min of aerobic exercise without signs/symptoms of physical distress. Continue with 30 min of aerobic exercise without signs/symptoms of physical distress.    Intensity THRR unchanged THRR unchanged THRR unchanged THRR New      Progression   Progression Continue to progress workloads to maintain intensity without signs/symptoms of physical distress. Continue to progress workloads to maintain intensity without signs/symptoms of physical distress. Continue to progress workloads to maintain intensity without signs/symptoms of physical distress. Continue to progress workloads to maintain intensity without signs/symptoms of physical distress.    Average METs 4.4 6.8 6.5 7.25      Resistance Training   Training Prescription Yes Yes Yes Yes    Weight 5 5 5  lbs 7 lbs    Reps 10-15 10-15 10-15 10-15    Time -- 10 Minutes 10 Minutes 10 Minutes      Interval Training   Interval Training -- No No Yes    Equipment -- -- -- Elliptical;Bike    Comments -- -- -- HIIT      Bike   Level 3 3 3 6     Watts 73 117 75 --    Minutes 15 15 15 15      METs 4.4 6.3 7.8 8.2      Elliptical   Level 1 2 2 5     Speed 1 2 2 5     Minutes 15 15 15 15     METs -- 7.4 6.5 8.9      Home Exercise Plan   Plans to continue exercise at -- -- Home (comment) Home (comment)    Frequency -- -- Add 2 additional days to program exercise sessions. Add 2 additional days to program exercise sessions.    Initial Home Exercises Provided -- -- 04/01/23 04/01/23             Exercise Comments:   Exercise Comments     Row Name 03/04/23 1218 03/18/23 1213 04/01/23 1111 04/26/23 0845     Exercise Comments Pt did very well his first day exercising, pt averaged 4.4 METs w/o any abnormal signs or symptoms. Will continue to progress patient as tolerated. Reviewed METs, pt is progressing well; peak METs 7.4 Discussed METs, goals, and home ExRx with pt. Pt voiced understanding of METs. Nour's goals are to return to cycling and hiking as he has started his cycling and hiking again. He has not had signs/symptoms when cycling or hiking. He monitors his heart rate while doing both and is not going out of THR. Ree Kida  is meeting exercise guidlines as he cycles, walks, or hikes 5-7 days/wk for at least 30 min/day. Discussed safetly guidelines and weather restrictions. Anthuan voiced understanding. Peak METs 7.8. Reviewed METs and goals. Pt is happy with progress. Will see MD next week and is hoping to be taken off his blood pressure medication. no modalitiy changes at this time.             Exercise Goals and Review:   Exercise Goals     Row Name 02/26/23 0903             Exercise Goals   Increase Physical Activity Yes       Intervention Provide advice, education, support and counseling about physical activity/exercise needs.;Develop an individualized exercise prescription for aerobic and resistive training based on initial evaluation findings, risk stratification, comorbidities and participant's personal goals.       Expected Outcomes Short Term: Attend rehab on a  regular basis to increase amount of physical activity.;Long Term: Add in home exercise to make exercise part of routine and to increase amount of physical activity.;Long Term: Exercising regularly at least 3-5 days a week.       Increase Strength and Stamina Yes       Intervention Provide advice, education, support and counseling about physical activity/exercise needs.;Develop an individualized exercise prescription for aerobic and resistive training based on initial evaluation findings, risk stratification, comorbidities and participant's personal goals.       Expected Outcomes Short Term: Increase workloads from initial exercise prescription for resistance, speed, and METs.;Short Term: Perform resistance training exercises routinely during rehab and add in resistance training at home;Long Term: Improve cardiorespiratory fitness, muscular endurance and strength as measured by increased METs and functional capacity ( )       Able to understand and use rate of perceived exertion (RPE) scale Yes       Intervention Provide education and explanation on how to use RPE scale       Expected Outcomes Short Term: Able to use RPE daily in rehab to express subjective intensity level;Long Term:  Able to use RPE to guide intensity level when exercising independently       Knowledge and understanding of Target Heart Rate Range (THRR) Yes       Intervention Provide education and explanation of THRR including how the numbers were predicted and where they are located for reference       Expected Outcomes Short Term: Able to state/look up THRR;Long Term: Able to use THRR to govern intensity when exercising independently;Short Term: Able to use daily as guideline for intensity in rehab       Able to check pulse independently Yes       Intervention Provide education and demonstration on how to check pulse in carotid and radial arteries.;Review the importance of being able to check your own pulse for safety during  independent exercise       Expected Outcomes Short Term: Able to explain why pulse checking is important during independent exercise;Long Term: Able to check pulse independently and accurately       Understanding of Exercise Prescription Yes       Intervention Provide education, explanation, and written materials on patient's individual exercise prescription       Expected Outcomes Short Term: Able to explain program exercise prescription;Long Term: Able to explain home exercise prescription to exercise independently                Exercise Goals Re-Evaluation :  Exercise Goals Re-Evaluation     Row Name 03/04/23 1214 04/01/23 1105 04/26/23 0842         Exercise Goal Re-Evaluation   Exercise Goals Review Increase Physical Activity;Knowledge and understanding of Target Heart Rate Range (THRR);Increase Strength and Stamina;Able to understand and use rate of perceived exertion (RPE) scale;Understanding of Exercise Prescription;Able to understand and use Dyspnea scale Increase Physical Activity;Knowledge and understanding of Target Heart Rate Range (THRR);Increase Strength and Stamina;Able to understand and use rate of perceived exertion (RPE) scale;Understanding of Exercise Prescription;Able to understand and use Dyspnea scale Increase Physical Activity;Knowledge and understanding of Target Heart Rate Range (THRR);Increase Strength and Stamina;Able to understand and use rate of perceived exertion (RPE) scale;Understanding of Exercise Prescription;Able to understand and use Dyspnea scale     Comments Pt first day in program, pt averaged 4.4 METs during his first day in program. He tolerated exercise without any abnormal s/s. Pt was educated on THRR and RPE. Discussed METs, goals, and home ExRx with pt. Pt voiced understanding of METs. Uzair's goals are to return to cycling and hiking as he has started his cycling and hiking again. He has not had signs/symptoms when cycling or hiking. He monitors his  heart rate while doing both and is not going out of THR. Rakeem is meeting exercise guidlines as he cycles, walks, or hikes 5-7 days/wk for at least 30 min/day. Discussed safetly guidelines and weather restrictions. Shammah voiced understanding. Peak METs 7.8. Reviewed METs and goals. Pt is making good progresss and we have started HIIT here in the program. Pt is hiking, cycling at home. Pt has Peak METs of 8.9.     Expected Outcomes Will continue to progress pt through program while increasing resistance in the absence of any abnormal s/s. Will continue to progress pt through program while increasing resistance in the absence of any abnormal s/s. Will continue to montior and will progress workloads as tolerated.              Discharge Exercise Prescription (Final Exercise Prescription Changes):  Exercise Prescription Changes - 04/26/23 1100       Response to Exercise   Blood Pressure (Admit) 110/70    Blood Pressure (Exit) 114/79    Heart Rate (Admit) 75 bpm    Heart Rate (Exercise) 134 bpm    Heart Rate (Exit) 84 bpm    Rating of Perceived Exertion (Exercise) 13    Symptoms None    Comments Reviewed METs and goals    Duration Continue with 30 min of aerobic exercise without signs/symptoms of physical distress.    Intensity THRR New      Progression   Progression Continue to progress workloads to maintain intensity without signs/symptoms of physical distress.    Average METs 7.25      Resistance Training   Training Prescription Yes    Weight 7 lbs    Reps 10-15    Time 10 Minutes      Interval Training   Interval Training Yes    Equipment Elliptical;Bike    Comments HIIT      Bike   Level 6    Minutes 15    METs 8.2      Elliptical   Level 5    Speed 5    Minutes 15    METs 8.9      Home Exercise Plan   Plans to continue exercise at Home (comment)    Frequency Add 2 additional days to program exercise sessions.  Initial Home Exercises Provided 04/01/23              Nutrition:  Target Goals: Understanding of nutrition guidelines, daily intake of sodium 1500mg , cholesterol 200mg , calories 30% from fat and 7% or less from saturated fats, daily to have 5 or more servings of fruits and vegetables.  Biometrics:  Pre Biometrics - 02/26/23 0815       Pre Biometrics   Waist Circumference 40.25 inches    Hip Circumference 41.75 inches    Waist to Hip Ratio 0.96 %    Triceps Skinfold 12 mm    % Body Fat 25 %    Grip Strength 46 kg    Flexibility 8 in    Single Leg Stand 30 seconds              Nutrition Therapy Plan and Nutrition Goals:  Nutrition Therapy & Goals - 04/05/23 1010       Nutrition Therapy   Diet heart healthy diet    Drug/Food Interactions Statins/Certain Fruits      Personal Nutrition Goals   Nutrition Goal Patient to identify strategies for reducing cardiovascular risk by attending the [employer] education and nutrition series weekly.    Personal Goal #2 Patient to improve diet quality by using the plate method as a guide for meal planning to include lean protein/plant protein, fruits, vegetables, whole grains, nonfat dairy as part of a well-balanced diet.    Personal Goal #3 Patient to reduce sodium to 2000mg  per day    Comments Goals in action. Manvir continues to attend the [employer] and nutrition series regularly. He has also completed MNT one on one on 9/27.  He continues to follow a high fiber diet with mindfulness of alcohol intake, saturated fat, and sodium. He is interested in weight loss of 5-10# ;however, BMI remains appropriate for age. He does practice intermittent fasting 1-2 days per week to aid with weight maintenance. Levere has medical history of mitral valve prolapse s/p surgical repair at the [facility] and hyperlipidemia. Patient will benefit from participation in intensive cardiac rehab for nutrition, exercise, and lifestyle modification.      Intervention Plan   Intervention  Prescribe, educate and counsel regarding individualized specific dietary modifications aiming towards targeted core components such as weight, hypertension, lipid management, diabetes, heart failure and other comorbidities.;Nutrition handout(s) given to patient.    Expected Outcomes Short Term Goal: Understand basic principles of dietary content, such as calories, fat, sodium, cholesterol and nutrients.;Long Term Goal: Adherence to prescribed nutrition plan.             Nutrition Assessments:  Nutrition Assessments - 03/06/23 1629       Rate Your Plate Scores   Pre Score 61            MEDIFICTS Score Key: >=70 Need to make dietary changes  40-70 Heart Healthy Diet <= 40 Therapeutic Level Cholesterol Diet   Flowsheet Row INTENSIVE CARDIAC REHAB from 03/06/2023 in [facility]  Picture Your Plate Total Score on Admission 61      Picture Your Plate Scores: <52 Unhealthy dietary pattern with much room for improvement. 41-50 Dietary pattern unlikely to meet recommendations for good health and room for improvement. 51-60 More healthful dietary pattern, with some room for improvement.  >60 Healthy dietary pattern, although there may be some specific behaviors that could be improved.    Nutrition Goals Re-Evaluation:  Nutrition Goals Re-Evaluation  Row Name 03/04/23 1005 04/05/23 1010           Goals   Current Weight 195 lb 5.2 oz (88.6 kg) 194 lb 7.1 oz (88.2 kg)      Comment lipids WNL-LDL 90 no new labs; most recent labs  lipids WNL-LDL 90 (crestor)      Expected Outcome Justinryan has medical history of mitral valve prolapse s/p surgical repair at the [facility]  and hyperlipidemia. Patient will benefit from participation in intensive cardiac rehab for nutrition, exercise, and lifestyle modification. Goals in action. Caesar continues to attend the [employer] and nutrition series regularly. He has also  completed MNT one on one on 9/27. He continues to follow a high fiber diet with mindfulness of alcohol intake, saturated fat, and sodium. He is interested in weight loss of 5-10# ;however, BMI remains appropriate for age. He does practice intermittent fasting 1-2 days per week to aid with weight maintenance. Eiji has medical history of mitral valve prolapse s/p surgical repair at the [facility] and hyperlipidemia. Patient will benefit from participation in intensive cardiac rehab for nutrition, exercise, and lifestyle modification.               Nutrition Goals Re-Evaluation:  Nutrition Goals Re-Evaluation     Row Name 03/04/23 1005 04/05/23 1010           Goals   Current Weight 195 lb 5.2 oz (88.6 kg) 194 lb 7.1 oz (88.2 kg)      Comment lipids WNL-LDL 90 no new labs; most recent labs  lipids WNL-LDL 90 (crestor)      Expected Outcome Stanly has medical history of mitral valve prolapse s/p surgical repair at the [facility]  and hyperlipidemia. Patient will benefit from participation in intensive cardiac rehab for nutrition, exercise, and lifestyle modification. Goals in action. Gabrian continues to attend the [employer] and nutrition series regularly. He has also completed MNT one on one on 9/27. He continues to follow a high fiber diet with mindfulness of alcohol intake, saturated fat, and sodium. He is interested in weight loss of 5-10# ;however, BMI remains appropriate for age. He does practice intermittent fasting 1-2 days per week to aid with weight maintenance. Keani has medical history of mitral valve prolapse s/p surgical repair at the [facility] and hyperlipidemia. Patient will benefit from participation in intensive cardiac rehab for nutrition, exercise, and lifestyle modification.               Nutrition Goals Discharge (Final Nutrition Goals Re-Evaluation):  Nutrition Goals Re-Evaluation - 04/05/23 1010       Goals   Current Weight 194 lb 7.1 oz (88.2  kg)    Comment no new labs; most recent labs  lipids WNL-LDL 90 (crestor)    Expected Outcome Goals in action. Guillaume continues to attend the [employer] and nutrition series regularly. He has also completed MNT one on one on 9/27. He continues to follow a high fiber diet with mindfulness of alcohol intake, saturated fat, and sodium. He is interested in weight loss of 5-10# ;however, BMI remains appropriate for age. He does practice intermittent fasting 1-2 days per week to aid with weight maintenance. Kaler has medical history of mitral valve prolapse s/p surgical repair at the [facility] and hyperlipidemia. Patient will benefit from participation in intensive cardiac rehab for nutrition, exercise, and lifestyle modification.             Psychosocial: Target Goals: Acknowledge presence or absence  of significant depression and/or stress, maximize coping skills, provide positive support system. Participant is able to verbalize types and ability to use techniques and skills needed for reducing stress and depression.  Initial Review & Psychosocial Screening:  Initial Psych Review & Screening - 02/26/23 0936       Initial Review   Current issues with None Identified      Family Dynamics   Good Support System? Yes   Blaiz has his wife for support     Barriers   Psychosocial barriers to participate in program There are no identifiable barriers or psychosocial needs.      Screening Interventions   Interventions Encouraged to exercise             Quality of Life Scores:  Quality of Life - 02/26/23 0859       Quality of Life   Select Quality of Life      Quality of Life Scores   Health/Function Pre 28.7 %    Socioeconomic Pre 28.75 %    Psych/Spiritual Pre 28.93 %    Family Pre 30 %    GLOBAL Pre 28.94 %            Scores of 19 and below usually indicate a poorer quality of life in these areas.  A difference of  2-3 points is a clinically meaningful difference.   A difference of 2-3 points in the total score of the Quality of Life Index has been associated with significant improvement in overall quality of life, self-image, physical symptoms, and general health in studies assessing change in quality of life.  PHQ-9: Review Flowsheet       02/26/2023  Depression screen PHQ 2/9  Decreased Interest 1  Down, Depressed, Hopeless 0  PHQ - 2 Score 1  Altered sleeping 1  Tired, decreased energy 1  Change in appetite 0  Feeling bad or failure about yourself  0  Trouble concentrating 0  Moving slowly or fidgety/restless 0  Suicidal thoughts 0  PHQ-9 Score 3  Difficult doing work/chores Not difficult at all    Details           Interpretation of Total Score  Total Score Depression Severity:  1-4 = Minimal depression, 5-9 = Mild depression, 10-14 = Moderate depression, 15-19 = Moderately severe depression, 20-27 = Severe depression   Psychosocial Evaluation and Intervention:   Psychosocial Re-Evaluation:  Psychosocial Re-Evaluation     Row Name 03/05/23 0849 04/02/23 1015 04/25/23 1231         Psychosocial Re-Evaluation   Current issues with None Identified None Identified None Identified     Interventions Encouraged to attend Cardiac Rehabilitation for the exercise Encouraged to attend Cardiac Rehabilitation for the exercise Encouraged to attend Cardiac Rehabilitation for the exercise     Continue Psychosocial Services  No Follow up required No Follow up required No Follow up required              Psychosocial Discharge (Final Psychosocial Re-Evaluation):  Psychosocial Re-Evaluation - 04/25/23 1231       Psychosocial Re-Evaluation   Current issues with None Identified    Interventions Encouraged to attend Cardiac Rehabilitation for the exercise    Continue Psychosocial Services  No Follow up required             Vocational Rehabilitation: Provide vocational rehab assistance to qualifying candidates.   Vocational Rehab  Evaluation & Intervention:  Vocational Rehab - 02/26/23 1610  Initial Vocational Rehab Evaluation & Intervention   Assessment shows need for Vocational Rehabilitation No   Daune is retired and does not need vocational rehab at this time            Education: Education Goals: Education classes will be provided on a weekly basis, covering required topics. Participant will state understanding/return demonstration of topics presented.    Education     Row Name 03/04/23 1100     Education   Cardiac Education Topics Pritikin   Select Core Videos     Core Videos   Educator Dietitian   Select Nutrition   Nutrition Overview of the Pritikin Eating Plan   Instruction Review Code 1- Verbalizes Understanding   Class Start Time 0815   Class Stop Time 0850   Class Time Calculation (min) 35 min    Row Name 03/06/23 1100     Education   Cardiac Education Topics Pritikin   Orthoptist   Educator Dietitian   Weekly Topic Fast and Healthy Breakfasts   Instruction Review Code 1- Verbalizes Understanding   Class Start Time 0815   Class Stop Time 0847   Class Time Calculation (min) 32 min    Row Name 03/08/23 0800     Education   Cardiac Education Topics Pritikin   Nurse, children's   Educator Exercise Physiologist   Select Psychosocial   Psychosocial Healthy Minds, Bodies, Hearts   Instruction Review Code 1- Verbalizes Understanding   Class Start Time 0813   Class Stop Time 0845   Class Time Calculation (min) 32 min    Row Name 03/18/23 0900     Education   Cardiac Education Topics Pritikin   Glass blower/designer Nutrition   Nutrition Workshop Label Reading   Instruction Review Code 1- Verbalizes Understanding   Class Start Time 0815   Class Stop Time 0904   Class Time Calculation (min) 49 min    Row Name 03/20/23 1100     Education   Cardiac Education Topics Pritikin    Customer service manager   Weekly Topic [employer] Desserts   Instruction Review Code 1- Verbalizes Understanding   Class Start Time 0820   Class Stop Time 0900   Class Time Calculation (min) 40 min    Row Name 03/22/23 1000     Education   Cardiac Education Topics Pritikin   Select Core Videos     Core Videos   Educator Dietitian   Nutrition Other  Label Reading   Instruction Review Code 1- Verbalizes Understanding   Class Start Time 0815   Class Stop Time 0900   Class Time Calculation (min) 45 min    Row Name 03/25/23 1100     Education   Cardiac Education Topics Pritikin   Select Workshops     Workshops   Educator Exercise Physiologist   Select Psychosocial   Psychosocial Workshop Recognizing and Reducing Stress   Instruction Review Code 1- Verbalizes Understanding   Class Start Time 0815   Class Stop Time 0900   Class Time Calculation (min) 45 min    Row Name 03/27/23 1000     Education   Cardiac Education Topics Pritikin   Customer service manager   Weekly Topic Tasty Appetizers  and Snacks   Instruction Review Code 1- Verbalizes Understanding   Class Start Time 0815   Class Stop Time 0900   Class Time Calculation (min) 45 min    Row Name 03/29/23 1300     Education   Cardiac Education Topics Pritikin   Select Core Videos     Core Videos   Educator Exercise Physiologist   Select Nutrition   Nutrition Calorie Density   Instruction Review Code 1- Verbalizes Understanding   Class Start Time [phone]   Class Stop Time 0900   Class Time Calculation (min) 44 min    Row Name 04/03/23 1100     Education   Cardiac Education Topics Pritikin   Customer service manager   Weekly Topic Efficiency Cooking - Meals in a Snap   Instruction Review Code 1- Verbalizes Understanding   Class Start Time 0815   Class Stop Time 0900   Class Time  Calculation (min) 45 min    Row Name 04/08/23 1000     Education   Cardiac Education Topics Pritikin   Glass blower/designer Nutrition   Nutrition Workshop Targeting Your Nutrition Priorities   Instruction Review Code 1- Verbalizes Understanding   Class Start Time 0815   Class Stop Time 0900   Class Time Calculation (min) 45 min    Row Name 04/10/23 0900     Education   Cardiac Education Topics Pritikin   Secondary school teacher School   Educator Dietitian   Weekly Topic One-Pot Wonders   Instruction Review Code 1- Verbalizes Understanding   Class Start Time 0815   Class Stop Time 0855   Class Time Calculation (min) 40 min    Row Name 04/12/23 0900     Education   Cardiac Education Topics Pritikin   Psychologist, forensic General Education   General Education Hypertension and Heart Disease   Instruction Review Code 1- Verbalizes Understanding   Class Start Time 0818   Class Stop Time 0850   Class Time Calculation (min) 32 min    Row Name 04/24/23 0800     Education   Cardiac Education Topics Pritikin   Select Core Videos     Core Videos   Educator Exercise Physiologist   Select Nutrition   Nutrition Vitamins and Minerals   Instruction Review Code 1- Verbalizes Understanding   Class Start Time [phone]   Class Stop Time 0902   Class Time Calculation (min) 48 min    Row Name 04/26/23 0900     Education   Cardiac Education Topics Pritikin   Secondary school teacher School   Educator Dietitian   Weekly Topic Fast Evening Meals   Instruction Review Code 1- Verbalizes Understanding   Class Start Time 0815   Class Stop Time 0850   Class Time Calculation (min) 35 min            Core Videos: Exercise    Move It!  Clinical staff conducted group or individual video education with verbal and written material and guidebook.  Patient learns  the recommended Pritikin exercise program. Exercise with the goal of living a long, healthy life. Some of the health benefits of exercise include controlled diabetes, healthier blood pressure levels, improved cholesterol levels, improved heart  and lung capacity, improved sleep, and better body composition. Everyone should speak with their doctor before starting or changing an exercise routine.  Biomechanical Limitations Clinical staff conducted group or individual video education with verbal and written material and guidebook.  Patient learns how biomechanical limitations can impact exercise and how we can mitigate and possibly overcome limitations to have an impactful and balanced exercise routine.  Body Composition Clinical staff conducted group or individual video education with verbal and written material and guidebook.  Patient learns that body composition (ratio of muscle mass to fat mass) is a key component to assessing overall fitness, rather than body weight alone. Increased fat mass, especially visceral belly fat, can put [place] at increased risk for metabolic syndrome, type 2 diabetes, heart disease, and even death. It is recommended to combine diet and exercise (cardiovascular and resistance training) to improve your body composition. Seek guidance from your physician and exercise physiologist before implementing an exercise routine.  Exercise Action Plan Clinical staff conducted group or individual video education with verbal and written material and guidebook.  Patient learns the recommended strategies to achieve and enjoy long-term exercise adherence, including variety, self-motivation, self-efficacy, and positive decision making. Benefits of exercise include fitness, good health, weight management, more energy, better sleep, less stress, and overall well-being.  Medical   Heart Disease Risk Reduction Clinical staff conducted group or individual video education with verbal and written  material and guidebook.  Patient learns our heart is our most vital organ as it circulates oxygen, nutrients, white blood cells, and hormones throughout the entire body, and carries waste away. Data supports a plant-based eating plan like the Pritikin Program for its effectiveness in slowing progression of and reversing heart disease. The video provides a number of recommendations to address heart disease.   Metabolic Syndrome and Belly Fat  Clinical staff conducted group or individual video education with verbal and written material and guidebook.  Patient learns what metabolic syndrome is, how it leads to heart disease, and how one can reverse it and keep it from coming back. You have metabolic syndrome if you have 3 of the following 5 criteria: abdominal obesity, high blood pressure, high triglycerides, low HDL cholesterol, and high blood sugar.  Hypertension and Heart Disease Clinical staff conducted group or individual video education with verbal and written material and guidebook.  Patient learns that high blood pressure, or hypertension, is very common in the [place]. Hypertension is largely due to excessive salt intake, but other important risk factors include being overweight, physical inactivity, drinking too much alcohol, smoking, and not eating enough potassium from fruits and vegetables. High blood pressure is a leading risk factor for heart attack, stroke, congestive heart failure, dementia, kidney failure, and premature death. Long-term effects of excessive salt intake include stiffening of the arteries and thickening of heart muscle and organ damage. Recommendations include ways to reduce hypertension and the risk of heart disease.  Diseases of Our Time - Focusing on Diabetes Clinical staff conducted group or individual video education with verbal and written material and guidebook.  Patient learns why the best way to stop diseases of our time is prevention, through food and other  lifestyle changes. Medicine (such as prescription pills and surgeries) is often only a Band-Aid on the problem, not a long-term solution. Most common diseases of our time include obesity, type 2 diabetes, hypertension, heart disease, and cancer. The Pritikin Program is recommended and has been proven to help reduce, reverse, and/or prevent the damaging effects  of metabolic syndrome.  Nutrition   Overview of the Pritikin Eating Plan  Clinical staff conducted group or individual video education with verbal and written material and guidebook.  Patient learns about the Pritikin Eating Plan for disease risk reduction. The Pritikin Eating Plan emphasizes a wide variety of unrefined, minimally-processed carbohydrates, like fruits, vegetables, whole grains, and legumes. Go, Caution, and Stop food choices are explained. Plant-based and lean animal proteins are emphasized. Rationale provided for low sodium intake for blood pressure control, low added sugars for blood sugar stabilization, and low added fats and oils for coronary artery disease risk reduction and weight management.  Calorie Density  Clinical staff conducted group or individual video education with verbal and written material and guidebook.  Patient learns about calorie density and how it impacts the Pritikin Eating Plan. Knowing the characteristics of the food you choose will help you decide whether those foods will lead to weight gain or weight loss, and whether you want to consume more or less of them. Weight loss is usually a side effect of the Pritikin Eating Plan because of its focus on low calorie-dense foods.  Label Reading  Clinical staff conducted group or individual video education with verbal and written material and guidebook.  Patient learns about the Pritikin recommended label reading guidelines and corresponding recommendations regarding calorie density, added sugars, sodium content, and whole grains.  Dining Out - Part 1   Clinical staff conducted group or individual video education with verbal and written material and guidebook.  Patient learns that restaurant meals can be sabotaging because they can be so high in calories, fat, sodium, and/or sugar. Patient learns recommended strategies on how to positively address this and avoid unhealthy pitfalls.  Facts on Fats  Clinical staff conducted group or individual video education with verbal and written material and guidebook.  Patient learns that lifestyle modifications can be just as effective, if not more so, as many medications for lowering your risk of heart disease. A Pritikin lifestyle can help to reduce your risk of inflammation and atherosclerosis (cholesterol build-up, or plaque, in the artery walls). Lifestyle interventions such as dietary choices and physical activity address the cause of atherosclerosis. A review of the types of fats and their impact on blood cholesterol levels, along with dietary recommendations to reduce fat intake is also included.  Nutrition Action Plan  Clinical staff conducted group or individual video education with verbal and written material and guidebook.  Patient learns how to incorporate Pritikin recommendations into their lifestyle. Recommendations include planning and keeping personal health goals in mind as an important part of their success.  Healthy Mind-Set    Healthy Minds, Bodies, Hearts  Clinical staff conducted group or individual video education with verbal and written material and guidebook.  Patient learns how to identify when they are stressed. Video will discuss the impact of that stress, as well as the many benefits of stress management. Patient will also be introduced to stress management techniques. The way we think, act, and feel has an impact on our hearts.  How Our Thoughts Can Heal Our Hearts  Clinical staff conducted group or individual video education with verbal and written material and guidebook.   Patient learns that negative thoughts can cause depression and anxiety. This can result in negative lifestyle behavior and serious health problems. Cognitive behavioral therapy is an effective method to help control our thoughts in order to change and improve our emotional outlook.  Additional Videos:  Exercise    Improving Performance  Clinical staff conducted group or individual video education with verbal and written material and guidebook.  Patient learns to use a non-linear approach by alternating intensity levels and lengths of time spent exercising to help burn more calories and lose more body fat. Cardiovascular exercise helps improve heart health, metabolism, hormonal balance, blood sugar control, and recovery from fatigue. Resistance training improves strength, endurance, balance, coordination, reaction time, metabolism, and muscle mass. Flexibility exercise improves circulation, posture, and balance. Seek guidance from your physician and exercise physiologist before implementing an exercise routine and learn your capabilities and proper form for all exercise.  Introduction to Yoga  Clinical staff conducted group or individual video education with verbal and written material and guidebook.  Patient learns about yoga, a discipline of the coming together of mind, breath, and body. The benefits of yoga include improved flexibility, improved range of motion, better posture and core strength, increased lung function, weight loss, and positive self-image. Yoga's heart health benefits include lowered blood pressure, healthier heart rate, decreased cholesterol and triglyceride levels, improved immune function, and reduced stress. Seek guidance from your physician and exercise physiologist before implementing an exercise routine and learn your capabilities and proper form for all exercise.  Medical   Aging: Enhancing Your Quality of Life  Clinical staff conducted group or individual video education  with verbal and written material and guidebook.  Patient learns key strategies and recommendations to stay in good physical health and enhance quality of life, such as prevention strategies, having an advocate, securing a Health Care Proxy and Power of Attorney, and keeping a list of medications and system for tracking them. It also discusses how to avoid risk for bone loss.  Biology of Weight Control  Clinical staff conducted group or individual video education with verbal and written material and guidebook.  Patient learns that weight gain occurs because we consume more calories than we burn (eating more, moving less). Even if your body weight is normal, you may have higher ratios of fat compared to muscle mass. Too much body fat puts you at increased risk for cardiovascular disease, heart attack, stroke, type 2 diabetes, and obesity-related cancers. In addition to exercise, following the Pritikin Eating Plan can help reduce your risk.  Decoding Lab Results  Clinical staff conducted group or individual video education with verbal and written material and guidebook.  Patient learns that lab test reflects one measurement whose values change over time and are influenced by many factors, including medication, stress, sleep, exercise, food, hydration, pre-existing medical conditions, and more. It is recommended to use the knowledge from this video to become more involved with your lab results and evaluate your numbers to speak with your doctor.   Diseases of Our Time - Overview  Clinical staff conducted group or individual video education with verbal and written material and guidebook.  Patient learns that according to the CDC, 50% to 70% of chronic diseases (such as obesity, type 2 diabetes, elevated lipids, hypertension, and heart disease) are avoidable through lifestyle improvements including healthier food choices, listening to satiety cues, and increased physical activity.  Sleep  Disorders Clinical staff conducted group or individual video education with verbal and written material and guidebook.  Patient learns how good quality and duration of sleep are important to overall health and well-being. Patient also learns about sleep disorders and how they impact health along with recommendations to address them, including discussing with a physician.  Nutrition  Dining Out - Part 2 Clinical staff conducted group or individual  video education with verbal and written material and guidebook.  Patient learns how to plan ahead and communicate in order to maximize their dining experience in a healthy and nutritious manner. Included are recommended food choices based on the type of restaurant the patient is visiting.   Fueling a Banker conducted group or individual video education with verbal and written material and guidebook.  There is a strong connection between our food choices and our health. Diseases like obesity and type 2 diabetes are very prevalent and are in large-part due to lifestyle choices. The Pritikin Eating Plan provides plenty of food and hunger-curbing satisfaction. It is easy to follow, affordable, and helps reduce health risks.  Menu Workshop  Clinical staff conducted group or individual video education with verbal and written material and guidebook.  Patient learns that restaurant meals can sabotage health goals because they are often packed with calories, fat, sodium, and sugar. Recommendations include strategies to plan ahead and to communicate with the manager, chef, or server to help order a healthier meal.  Planning Your Eating Strategy  Clinical staff conducted group or individual video education with verbal and written material and guidebook.  Patient learns about the Pritikin Eating Plan and its benefit of reducing the risk of disease. The Pritikin Eating Plan does not focus on calories. Instead, it emphasizes high-quality,  nutrient-rich foods. By knowing the characteristics of the foods, we choose, we can determine their calorie density and make informed decisions.  Targeting Your Nutrition Priorities  Clinical staff conducted group or individual video education with verbal and written material and guidebook.  Patient learns that lifestyle habits have a tremendous impact on disease risk and progression. This video provides eating and physical activity recommendations based on your personal health goals, such as reducing LDL cholesterol, losing weight, preventing or controlling type 2 diabetes, and reducing high blood pressure.  Vitamins and Minerals  Clinical staff conducted group or individual video education with verbal and written material and guidebook.  Patient learns different ways to obtain key vitamins and minerals, including through a recommended healthy diet. It is important to discuss all supplements you take with your doctor.   Healthy Mind-Set    Smoking Cessation  Clinical staff conducted group or individual video education with verbal and written material and guidebook.  Patient learns that cigarette smoking and tobacco addiction pose a serious health risk which affects millions of people. Stopping smoking will significantly reduce the risk of heart disease, lung disease, and many forms of cancer. Recommended strategies for quitting are covered, including working with your doctor to develop a successful plan.  Culinary   Becoming a Set designer conducted group or individual video education with verbal and written material and guidebook.  Patient learns that cooking at home can be healthy, cost-effective, quick, and puts them in control. Keys to cooking healthy recipes will include looking at your recipe, assessing your equipment needs, planning ahead, making it simple, choosing cost-effective seasonal ingredients, and limiting the use of added fats, salts, and sugars.  Cooking -  Breakfast and Snacks  Clinical staff conducted group or individual video education with verbal and written material and guidebook.  Patient learns how important breakfast is to satiety and nutrition through the entire day. Recommendations include key foods to eat during breakfast to help stabilize blood sugar levels and to prevent overeating at meals later in the day. Planning ahead is also a key component.  Cooking - Public affairs consultant  Clinical staff conducted group or individual video education with verbal and written material and guidebook.  Patient learns eating strategies to improve overall health, including an approach to cook more at home. Recommendations include thinking of animal protein as a side on your plate rather than center stage and focusing instead on lower calorie dense options like vegetables, fruits, whole grains, and plant-based proteins, such as beans. Making sauces in large quantities to freeze for later and leaving the skin on your vegetables are also recommended to maximize your experience.  Cooking - Healthy Salads and Dressing Clinical staff conducted group or individual video education with verbal and written material and guidebook.  Patient learns that vegetables, fruits, whole grains, and legumes are the foundations of the Pritikin Eating Plan. Recommendations include how to incorporate each of these in flavorful and healthy salads, and how to create homemade salad dressings. Proper handling of ingredients is also covered. Cooking - Soups and [employer] - Soups and Desserts Clinical staff conducted group or individual video education with verbal and written material and guidebook.  Patient learns that Pritikin soups and desserts make for easy, nutritious, and delicious snacks and meal components that are low in sodium, fat, sugar, and calorie density, while high in vitamins, minerals, and filling fiber. Recommendations include simple and healthy ideas for soups and  desserts.   Overview     The Pritikin Solution Program Overview Clinical staff conducted group or individual video education with verbal and written material and guidebook.  Patient learns that the results of the Pritikin Program have been documented in more than 100 articles published in peer-reviewed journals, and the benefits include reducing risk factors for (and, in some cases, even reversing) high cholesterol, high blood pressure, type 2 diabetes, obesity, and more! An overview of the three key pillars of the Pritikin Program will be covered: eating well, doing regular exercise, and having a healthy mind-set.  WORKSHOPS  Exercise: Exercise Basics: Building Your Action Plan Clinical staff led group instruction and group discussion with PowerPoint presentation and patient guidebook. To enhance the learning environment the use of posters, models and videos may be added. At the conclusion of this workshop, patients will comprehend the difference between physical activity and exercise, as well as the benefits of incorporating both, into their routine. Patients will understand the FITT (Frequency, Intensity, Time, and Type) principle and how to use it to build an exercise action plan. In addition, safety concerns and other considerations for exercise and cardiac rehab will be addressed by the presenter. The purpose of this lesson is to promote a comprehensive and effective weekly exercise routine in order to improve patients' overall level of fitness.   Managing Heart Disease: Your Path to a Healthier Heart Clinical staff led group instruction and group discussion with PowerPoint presentation and patient guidebook. To enhance the learning environment the use of posters, models and videos may be added.At the conclusion of this workshop, patients will understand the anatomy and physiology of the heart. Additionally, they will understand how Pritikin's three pillars impact the risk factors, the  progression, and the management of heart disease.  The purpose of this lesson is to provide a high-level overview of the heart, heart disease, and how the Pritikin lifestyle positively impacts risk factors.  Exercise Biomechanics Clinical staff led group instruction and group discussion with PowerPoint presentation and patient guidebook. To enhance the learning environment the use of posters, models and videos may be added. Patients will learn how the structural parts of  their bodies function and how these functions impact their daily activities, movement, and exercise. Patients will learn how to promote a neutral spine, learn how to manage pain, and identify ways to improve their physical movement in order to promote healthy living. The purpose of this lesson is to expose patients to common physical limitations that impact physical activity. Participants will learn practical ways to adapt and manage aches and pains, and to minimize their effect on regular exercise. Patients will learn how to maintain good posture while sitting, walking, and lifting.  Balance Training and Fall Prevention  Clinical staff led group instruction and group discussion with PowerPoint presentation and patient guidebook. To enhance the learning environment the use of posters, models and videos may be added. At the conclusion of this workshop, patients will understand the importance of their sensorimotor skills (vision, proprioception, and the vestibular system) in maintaining their ability to balance as they age. Patients will apply a variety of balancing exercises that are appropriate for their current level of function. Patients will understand the common causes for poor balance, possible solutions to these problems, and ways to modify their physical environment in order to minimize their fall risk. The purpose of this lesson is to teach patients about the importance of maintaining balance as they age and ways to  minimize their risk of falling.  WORKSHOPS   Nutrition:  Fueling a Ship broker led group instruction and group discussion with PowerPoint presentation and patient guidebook. To enhance the learning environment the use of posters, models and videos may be added. Patients will review the foundational principles of the Pritikin Eating Plan and understand what constitutes a serving size in each of the food groups. Patients will also learn Pritikin-friendly foods that are better choices when away from home and review make-ahead meal and snack options. Calorie density will be reviewed and applied to three nutrition priorities: weight maintenance, weight loss, and weight gain. The purpose of this lesson is to reinforce (in a group setting) the key concepts around what patients are recommended to eat and how to apply these guidelines when away from home by planning and selecting Pritikin-friendly options. Patients will understand how calorie density may be adjusted for different weight management goals.  Mindful Eating  Clinical staff led group instruction and group discussion with PowerPoint presentation and patient guidebook. To enhance the learning environment the use of posters, models and videos may be added. Patients will briefly review the concepts of the Pritikin Eating Plan and the importance of low-calorie dense foods. The concept of mindful eating will be introduced as well as the importance of paying attention to internal hunger signals. Triggers for non-hunger eating and techniques for dealing with triggers will be explored. The purpose of this lesson is to provide patients with the opportunity to review the basic principles of the Pritikin Eating Plan, discuss the value of eating mindfully and how to measure internal cues of hunger and fullness using the Hunger Scale. Patients will also discuss reasons for non-hunger eating and learn strategies to use for controlling emotional  eating.  Targeting Your Nutrition Priorities Clinical staff led group instruction and group discussion with PowerPoint presentation and patient guidebook. To enhance the learning environment the use of posters, models and videos may be added. Patients will learn how to determine their genetic susceptibility to disease by reviewing their family history. Patients will gain insight into the importance of diet as part of an overall healthy lifestyle in mitigating the impact of genetics  and other environmental insults. The purpose of this lesson is to provide patients with the opportunity to assess their personal nutrition priorities by looking at their family history, their own health history and current risk factors. Patients will also be able to discuss ways of prioritizing and modifying the Pritikin Eating Plan for their highest risk areas  Menu  Clinical staff led group instruction and group discussion with PowerPoint presentation and patient guidebook. To enhance the learning environment the use of posters, models and videos may be added. Using menus brought in from [employer], or printed from [employer], patients will apply the Pritikin dining out guidelines that were presented in the [employer] video. Patients will also be able to practice these guidelines in a variety of provided scenarios. The purpose of this lesson is to provide patients with the opportunity to practice hands-on learning of the Pritikin Dining Out guidelines with actual menus and practice scenarios.  Label Reading Clinical staff led group instruction and group discussion with PowerPoint presentation and patient guidebook. To enhance the learning environment the use of posters, models and videos may be added. Patients will review and discuss the Pritikin label reading guidelines presented in Pritikin's Label Reading Educational series video. Using fool labels brought in from local grocery stores and  markets, patients will apply the label reading guidelines and determine if the packaged food meet the Pritikin guidelines. The purpose of this lesson is to provide patients with the opportunity to review, discuss, and practice hands-on learning of the Pritikin Label Reading guidelines with actual packaged food labels. Cooking School  Pritikin's [employer] are designed to teach patients ways to prepare quick, simple, and affordable recipes at home. The importance of nutrition's role in chronic disease risk reduction is reflected in its emphasis in the overall Pritikin program. By learning how to prepare essential core Pritikin Eating Plan recipes, patients will increase control over what they eat; be able to customize the flavor of foods without the use of added salt, sugar, or fat; and improve the quality of the food they consume. By learning a set of core recipes which are easily assembled, quickly prepared, and affordable, patients are more likely to prepare more healthy foods at home. These workshops focus on convenient breakfasts, simple entres, side dishes, and desserts which can be prepared with minimal effort and are consistent with nutrition recommendations for cardiovascular risk reduction. Cooking [employer] are taught by a Armed forces logistics/support/administrative officer (RD) who has been trained by the [employer]. The chef or RD has a clear understanding of the importance of minimizing - if not completely eliminating - added fat, sugar, and sodium in recipes. Throughout the series of Cooking School Workshop sessions, patients will learn about healthy ingredients and efficient methods of cooking to build confidence in their capability to prepare    Cooking School weekly topics:  Adding Flavor- Sodium-Free  Fast and Healthy Breakfasts  Powerhouse Plant-Based Proteins  Satisfying Salads and Dressings  Simple Sides and Sauces  International Cuisine-Spotlight on the [employer]  Zones  Delicious Desserts  Savory Soups  [employer] - Meals in a Astronomer Appetizers and Snacks  Comforting Weekend Breakfasts  One-Pot Wonders   Fast Evening Meals  Landscape architect Your Pritikin Plate  WORKSHOPS   Healthy Mindset (Psychosocial):  Focused Goals, Sustainable Changes Clinical staff led group instruction and group discussion with PowerPoint presentation and patient guidebook. To enhance the learning environment the use of posters,  models and videos may be added. Patients will be able to apply effective goal setting strategies to establish at least one personal goal, and then take consistent, meaningful action toward that goal. They will learn to identify common barriers to achieving personal goals and develop strategies to overcome them. Patients will also gain an understanding of how our mind-set can impact our ability to achieve goals and the importance of cultivating a positive and growth-oriented mind-set. The purpose of this lesson is to provide patients with a deeper understanding of how to set and achieve personal goals, as well as the tools and strategies needed to overcome common obstacles which may arise along the way.  From Head to Heart: The Power of a Healthy Outlook  Clinical staff led group instruction and group discussion with PowerPoint presentation and patient guidebook. To enhance the learning environment the use of posters, models and videos may be added. Patients will be able to recognize and describe the impact of emotions and mood on physical health. They will discover the importance of self-care and explore self-care practices which may work for them. Patients will also learn how to utilize the 4 C's to cultivate a healthier outlook and better manage stress and challenges. The purpose of this lesson is to demonstrate to patients how a healthy outlook is an essential part of maintaining good health, especially as they continue their  cardiac rehab journey.  Healthy Sleep for a Healthy Heart Clinical staff led group instruction and group discussion with PowerPoint presentation and patient guidebook. To enhance the learning environment the use of posters, models and videos may be added. At the conclusion of this workshop, patients will be able to demonstrate knowledge of the importance of sleep to overall health, well-being, and quality of life. They will understand the symptoms of, and treatments for, common sleep disorders. Patients will also be able to identify daytime and nighttime behaviors which impact sleep, and they will be able to apply these tools to help manage sleep-related challenges. The purpose of this lesson is to provide patients with a general overview of sleep and outline the importance of quality sleep. Patients will learn about a few of the most common sleep disorders. Patients will also be introduced to the concept of "sleep hygiene," and discover ways to self-manage certain sleeping problems through simple daily behavior changes. Finally, the workshop will motivate patients by clarifying the links between quality sleep and their goals of heart-healthy living.   Recognizing and Reducing Stress Clinical staff led group instruction and group discussion with PowerPoint presentation and patient guidebook. To enhance the learning environment the use of posters, models and videos may be added. At the conclusion of this workshop, patients will be able to understand the types of stress reactions, differentiate between acute and chronic stress, and recognize the impact that chronic stress has on their health. They will also be able to apply different coping mechanisms, such as reframing negative self-talk. Patients will have the opportunity to practice a variety of stress management techniques, such as deep abdominal breathing, progressive muscle relaxation, and/or guided imagery.  The purpose of this lesson is to educate  patients on the role of stress in their lives and to provide healthy techniques for coping with it.  Learning Barriers/Preferences:  Learning Barriers/Preferences - 02/26/23 1215       Learning Barriers/Preferences   Learning Barriers Sight   wears contacts   Learning Preferences Video;Pictoral             Education  Topics:  Knowledge Questionnaire Score:  Knowledge Questionnaire Score - 02/26/23 0848       Knowledge Questionnaire Score   Pre Score 19/24             Core Components/Risk Factors/Patient Goals at Admission:  Personal Goals and Risk Factors at Admission - 02/26/23 0838       Core Components/Risk Factors/Patient Goals on Admission   Tobacco Cessation Yes    Intervention Offer self-teaching materials, assist with locating and accessing local/national Quit Smoking programs, and support quit date choice.    Expected Outcomes Long Term: Complete abstinence from all tobacco products for at least 12 months from quit date.;Short Term: Will quit all tobacco product use, adhering to prevention of relapse plan.    Lipids Yes    Intervention Provide education and support for participant on nutrition & aerobic/resistive exercise along with prescribed medications to achieve LDL 70mg , HDL >40mg .    Expected Outcomes Short Term: Participant states understanding of desired cholesterol values and is compliant with medications prescribed. Participant is following exercise prescription and nutrition guidelines.;Long Term: Cholesterol controlled with medications as prescribed, with individualized exercise RX and with personalized nutrition plan. Value goals: LDL < 70mg , HDL > 40 mg.             Core Components/Risk Factors/Patient Goals Review:   Goals and Risk Factor Review     Row Name 03/05/23 0851 04/02/23 1016 04/25/23 1232         Core Components/Risk Factors/Patient Goals Review   Personal Goals Review Weight Management/Obesity;Lipids Weight  Management/Obesity;Lipids Weight Management/Obesity;Lipids     Review Freeman started cardiac rehab on 03/04/23. Juwuan did well wiht exercise. Vital signs were stable. Niquan is doing well with  exercise. Vital signs have been  stable. Dressing dry  intact to right chest. Tamaris is doing well with  exercise. Vital signs have been  stable.     Expected Outcomes Leangelo will continue to participate in cardiac rehab for exercise, nutrition and lifestyle modifications Adelard will continue to participate in cardiac rehab for exercise, nutrition and lifestyle modifications Dag will continue to participate in cardiac rehab for exercise, nutrition and lifestyle modifications              Core Components/Risk Factors/Patient Goals at Discharge (Final Review):   Goals and Risk Factor Review - 04/25/23 1232       Core Components/Risk Factors/Patient Goals Review   Personal Goals Review Weight Management/Obesity;Lipids    Review Khylan is doing well with  exercise. Vital signs have been  stable.    Expected Outcomes Darrall will continue to participate in cardiac rehab for exercise, nutrition and lifestyle modifications             ITP Comments:  ITP Comments     Row Name 02/26/23 0815 03/05/23 0847 04/02/23 1014 04/25/23 1229     ITP Comments Medical Director- Dr. Armanda Magic, MD. Introduction to the Pritikin Education Program / Intensive Cardiac Rehab. Review of initial orientation folder. 30 Day ITP REview. Shadon started cardiac rehab on 03/04/23 and did well with exercise. 30 Day ITP REview. Yohan has good attendance and participation with exercise at cardiac rehab.. 30 Day ITP Review. Drequan returned to exercise on 04/24/23 after being out of town. Amarri is doing well with exercise             Comments: See ITP comments.

## 2023-04-29 ENCOUNTER — Ambulatory Visit: Payer: 59 | Attending: Cardiovascular Disease | Admitting: Cardiovascular Disease

## 2023-04-29 ENCOUNTER — Encounter: Payer: Self-pay | Admitting: Cardiovascular Disease

## 2023-04-29 ENCOUNTER — Encounter (HOSPITAL_COMMUNITY)
Admission: RE | Admit: 2023-04-29 | Discharge: 2023-04-29 | Disposition: A | Discharge status: Home / Self Care | Payer: 59 | Source: Ambulatory Visit | Attending: Cardiovascular Disease | Admitting: Cardiovascular Disease

## 2023-04-29 VITALS — BP 102/62 | HR 80 | Ht 74.0 in | Wt 189.6 lb

## 2023-04-29 DIAGNOSIS — Z9889 Other specified postprocedural states: Secondary | ICD-10-CM

## 2023-04-29 NOTE — Patient Instructions (Signed)
Medication Instructions:  Your physician has recommended you make the following change in your medication:  1-STOP Metoprolol  *If you need a refill on your cardiac medications before your next appointment, please call your pharmacy*  Lab Work: If you have labs (blood work) drawn today and your tests are completely normal, you will receive your results only by: MyChart Message (if you have MyChart) OR A paper copy in the mail If you have any lab test that is abnormal or we need to change your treatment, we will call you to review the results.  Testing/Procedures: Your physician has requested that you have an echocardiogram. Echocardiography is a painless test that uses sound waves to create images of your heart. It provides your doctor with information about the size and shape of your heart and how well your heart's chambers and valves are working. This procedure takes approximately one hour. There are no restrictions for this procedure. Please do NOT wear cologne, perfume, aftershave, or lotions (deodorant is allowed). Please arrive 15 minutes prior to your appointment time.   Follow-Up: At [facility], you and your health needs are our priority.  As part of our continuing mission to provide you with exceptional heart care, we have created designated Provider Care Teams.  These Care Teams include your primary Cardiologist (physician) and Advanced Practice Providers (APPs -  Physician Assistants and Nurse Practitioners) who all work together to provide you with the care you need, when you need it.  We recommend signing up for the patient portal called "MyChart".  Sign up information is provided on this After Visit Summary.  MyChart is used to connect with patients for Virtual Visits (Telemedicine).  Patients are able to view lab/test results, encounter notes, upcoming appointments, etc.  Non-urgent messages can be sent to your provider as well.   To learn more about what you can do  with MyChart, go to [email or web address].    Your next appointment:   6 month(s)  Provider:   Kristeen Miss, MD

## 2023-05-01 ENCOUNTER — Encounter (HOSPITAL_COMMUNITY)
Admission: RE | Admit: 2023-05-01 | Discharge: 2023-05-01 | Disposition: A | Discharge status: Home / Self Care | Payer: 59 | Source: Ambulatory Visit | Attending: Cardiovascular Disease | Admitting: Cardiovascular Disease

## 2023-05-01 DIAGNOSIS — Z9889 Other specified postprocedural states: Secondary | ICD-10-CM | POA: Diagnosis not present

## 2023-05-03 ENCOUNTER — Encounter (HOSPITAL_COMMUNITY): Payer: 59

## 2023-05-06 ENCOUNTER — Encounter (HOSPITAL_COMMUNITY): Payer: 59

## 2023-05-08 ENCOUNTER — Encounter (HOSPITAL_COMMUNITY)
Admission: RE | Admit: 2023-05-08 | Discharge: 2023-05-08 | Disposition: A | Discharge status: Home / Self Care | Payer: 59 | Source: Ambulatory Visit | Attending: Cardiovascular Disease | Admitting: Cardiovascular Disease

## 2023-05-08 DIAGNOSIS — Z9889 Other specified postprocedural states: Secondary | ICD-10-CM

## 2023-05-10 ENCOUNTER — Encounter (HOSPITAL_COMMUNITY)
Admission: RE | Admit: 2023-05-10 | Discharge: 2023-05-10 | Disposition: A | Discharge status: Home / Self Care | Payer: 59 | Source: Ambulatory Visit | Attending: Cardiovascular Disease | Admitting: Cardiovascular Disease

## 2023-05-10 DIAGNOSIS — Z9889 Other specified postprocedural states: Secondary | ICD-10-CM | POA: Insufficient documentation

## 2023-05-10 DIAGNOSIS — Z48812 Encounter for surgical aftercare following surgery on the circulatory system: Secondary | ICD-10-CM | POA: Insufficient documentation

## 2023-05-10 DIAGNOSIS — Z8679 Personal history of other diseases of the circulatory system: Secondary | ICD-10-CM | POA: Insufficient documentation

## 2023-05-13 ENCOUNTER — Encounter (HOSPITAL_COMMUNITY)
Admission: RE | Admit: 2023-05-13 | Discharge: 2023-05-13 | Disposition: A | Discharge status: Home / Self Care | Payer: 59 | Source: Ambulatory Visit | Attending: Cardiovascular Disease | Admitting: Cardiovascular Disease

## 2023-05-13 DIAGNOSIS — Z48812 Encounter for surgical aftercare following surgery on the circulatory system: Secondary | ICD-10-CM | POA: Diagnosis not present

## 2023-05-13 DIAGNOSIS — Z9889 Other specified postprocedural states: Secondary | ICD-10-CM

## 2023-05-15 ENCOUNTER — Encounter (HOSPITAL_COMMUNITY): Payer: 59

## 2023-05-17 ENCOUNTER — Encounter (HOSPITAL_COMMUNITY): Payer: 59

## 2023-05-20 ENCOUNTER — Encounter (HOSPITAL_COMMUNITY)
Admission: RE | Admit: 2023-05-20 | Discharge: 2023-05-20 | Disposition: A | Discharge status: Home / Self Care | Payer: 59 | Source: Ambulatory Visit | Attending: Cardiovascular Disease | Admitting: Cardiovascular Disease

## 2023-05-20 DIAGNOSIS — Z9889 Other specified postprocedural states: Secondary | ICD-10-CM

## 2023-05-20 DIAGNOSIS — Z48812 Encounter for surgical aftercare following surgery on the circulatory system: Secondary | ICD-10-CM | POA: Diagnosis not present

## 2023-05-22 ENCOUNTER — Encounter (HOSPITAL_COMMUNITY)
Admission: RE | Admit: 2023-05-22 | Discharge: 2023-05-22 | Disposition: A | Discharge status: Home / Self Care | Payer: 59 | Source: Ambulatory Visit | Attending: Cardiovascular Disease | Admitting: Cardiovascular Disease

## 2023-05-22 DIAGNOSIS — Z9889 Other specified postprocedural states: Secondary | ICD-10-CM

## 2023-05-22 DIAGNOSIS — Z48812 Encounter for surgical aftercare following surgery on the circulatory system: Secondary | ICD-10-CM | POA: Diagnosis not present

## 2023-05-24 ENCOUNTER — Encounter (HOSPITAL_COMMUNITY)
Admission: RE | Admit: 2023-05-24 | Discharge: 2023-05-24 | Disposition: A | Discharge status: Home / Self Care | Payer: 59 | Source: Ambulatory Visit | Attending: Cardiovascular Disease | Admitting: Cardiovascular Disease

## 2023-05-24 DIAGNOSIS — Z48812 Encounter for surgical aftercare following surgery on the circulatory system: Secondary | ICD-10-CM | POA: Diagnosis not present

## 2023-05-24 DIAGNOSIS — Z9889 Other specified postprocedural states: Secondary | ICD-10-CM

## 2023-05-24 NOTE — Progress Notes (Signed)
Cardiac Individual Treatment Plan  Patient Details  Name: OTHO RODELA MRN: [id number] Date of Birth: 20-Jan-1956 Referring Provider:   Flowsheet Row INTENSIVE CARDIAC REHAB ORIENT from 02/26/2023 in [facility] for Heart, Vascular, & Lung Health  Referring Provider Nahser, Deloris Ping, MD.       Initial Encounter Date:  Flowsheet Row INTENSIVE CARDIAC REHAB ORIENT from 02/26/2023 in [facility] for Heart, Vascular, & Lung Health  Date 02/26/23       Visit Diagnosis: 01/23/23 MVR (mitral valve repair) at the [facility]  Patient's Home Medications on Admission:  Current Outpatient Medications:    acetaminophen (TYLENOL) 325 MG tablet, as needed., Disp: , Rfl:    amoxicillin (AMOXIL) 500 MG capsule, Take 4 capsules by mouth one hour prior to dental cleanings/procedures (Patient not taking: Reported on 03/21/2023), Disp: 16 capsule, Rfl: 4   aspirin 81 MG tablet, Take 81 mg by mouth daily., Disp: , Rfl:    carboxymethylcellulose (REFRESH PLUS) 0.5 % SOLN, Place 1 drop into both eyes 2 (two) times daily., Disp: , Rfl:    ibuprofen (ADVIL) 200 MG tablet, as needed., Disp: , Rfl:    Multiple Vitamins-Minerals (MULTIVITAL PERFORMANCE) TABS, Take 1 tablet by mouth daily., Disp: , Rfl:    psyllium (METAMUCIL SMOOTH TEXTURE) 28 % packet, Take 1 packet by mouth at bedtime., Disp: , Rfl:    rosuvastatin (CRESTOR) 5 MG tablet, TAKE 1 TABLET (5 MG TOTAL) BY MOUTH DAILY. (Patient taking differently: Take 5 mg by mouth every other day.), Disp: 30 tablet, Rfl: 0   sildenafil (REVATIO) 20 MG tablet, daily as needed., Disp: , Rfl:   Current Facility-Administered Medications:    0.9 %  sodium chloride infusion, 500 mL, Intravenous, Once, Meryl Dare, MD  Past Medical History: Past Medical History:  Diagnosis Date   Anal fistula    Hyperlipidemia    on meds   Mitral valve prolapse    Last echocardiogram July, 2008. Moderate MVP of the posterior  leaflet. Moderate mitral regurgitation. Normal left ventricular systolic function.   Numbness in feet    Plantar fasciitis    right    Tobacco Use: Social History   Tobacco Use  Smoking Status Light Smoker   Types: Cigars  Smokeless Tobacco Never  Tobacco Comments   Occasional,  " smokes cigars at weddings    Labs: Review Flowsheet  More data exists      Latest Ref Rng & Units 11/22/2017 02/24/2019 06/03/2019 08/19/2020 10/16/2021  Labs for ITP Cardiac and Pulmonary Rehab  Cholestrol 100 - 199 mg/dL 284  132  440  102  725   LDL (calc) 0 - 99 mg/dL 95  366  92  87  90   HDL-C >39 mg/dL 66  61  62  65  66   Trlycerides 0 - 149 mg/dL 440  347  99  91  425     Details            Capillary Blood Glucose: No results found for: "GLUCAP"   Exercise Target Goals: Exercise Program Goal: Individual exercise prescription set using results from initial 6 min walk test and THRR while considering  patient's activity barriers and safety.   Exercise Prescription Goal: Initial exercise prescription builds to 30-45 minutes a day of aerobic activity, 2-3 days per week.  Home exercise guidelines will be given to patient during program as part of exercise prescription that the participant will acknowledge.  Activity  Barriers & Risk Stratification:  Activity Barriers & Cardiac Risk Stratification - 02/26/23 0901       Activity Barriers & Cardiac Risk Stratification   Activity Barriers Other (comment)    Comments Numbness- both feet, plantar fasciitis- right foot, h/x fx x4 - left hand, left knee arthroscopy/ meniscectomy, left shoulder arthroscopy/ biceps tendon repair.    Cardiac Risk Stratification Low             6 Minute Walk:  6 Minute Walk     Row Name 02/26/23 0930         6 Minute Walk   Phase Initial     Distance 1805 feet     Walk Time 6 minutes     # of Rest Breaks 0     MPH 3.42     METS 4.17     RPE 11     Perceived Dyspnea  0     VO2 Peak 14.61      Symptoms No     Resting HR 67 bpm     Resting BP 102/70     Resting Oxygen Saturation  98 %     Exercise Oxygen Saturation  during 6 min walk 97 %     Max Ex. HR 79 bpm     Max Ex. BP 140/78     2 Minute Post BP 116/78              Oxygen Initial Assessment:   Oxygen Re-Evaluation:   Oxygen Discharge (Final Oxygen Re-Evaluation):   Initial Exercise Prescription:  Initial Exercise Prescription - 02/26/23 1000       Date of Initial Exercise RX and Referring Provider   Date 02/26/23    Referring Provider Nahser, Deloris Ping, MD.    Expected Discharge Date 05/22/23      Bike   Level 3    Watts 60    Minutes 15    METs 4.2      Elliptical   Level 1    Speed 1    Minutes 15    METs 4.2      Prescription Details   Frequency (times per week) 3    Duration Progress to 30 minutes of continuous aerobic without signs/symptoms of physical distress      Intensity   THRR 40-80% of Max Heartrate 62-123    Ratings of Perceived Exertion 11-13    Perceived Dyspnea 0-4      Progression   Progression Continue to progress workloads to maintain intensity without signs/symptoms of physical distress.      Resistance Training   Training Prescription Yes    Weight 5 lbs    Reps 10-15             Perform Capillary Blood Glucose checks as needed.  Exercise Prescription Changes:   Exercise Prescription Changes     Row Name 03/04/23 1200 03/18/23 1200 04/01/23 1100 04/26/23 1100 05/22/23 1015     Response to Exercise   Blood Pressure (Admit) 102/62 112/70 130/70 110/70 122/70   Blood Pressure (Exercise) 138/78 138/7 132/68 -- --   Blood Pressure (Exit) 104/60 94/54 106/64 114/79 114/70   Heart Rate (Admit) 68 bpm 78 bpm 69 bpm 75 bpm 86 bpm   Heart Rate (Exercise) 103 bpm 116 bpm 100 bpm 134 bpm 151 bpm   Heart Rate (Exit) 102 bpm 86 bpm 78 bpm 84 bpm --   Rating of Perceived Exertion (Exercise) 12 13 11 13  13  Perceived Dyspnea (Exercise) 0 -- -- -- --   Symptoms  none None None None None   Comments pt first day Reviewed METs Discussed METs, goals, and home ExRx with pt. Pt voiced understanding of METs. Gatlyn's goals are to return to cycling and hiking as he has started his cycling and hiking again. He has not had signs/symptoms when cycling or hiking. He monitors his heart rate while doing both and is not going out of THR. Kyla is meeting exercise guidlines as he cycles, walks, or hikes 5-7 days/wk for at least 30 min/day. Discussed safetly guidelines and weather restrictions. Tyreese voiced understanding. Peak METs 7.8. Reviewed METs and goals Reviewed METs   Duration Continue with 30 min of aerobic exercise without signs/symptoms of physical distress. Continue with 30 min of aerobic exercise without signs/symptoms of physical distress. Continue with 30 min of aerobic exercise without signs/symptoms of physical distress. Continue with 30 min of aerobic exercise without signs/symptoms of physical distress. Continue with 30 min of aerobic exercise without signs/symptoms of physical distress.   Intensity THRR unchanged THRR unchanged THRR unchanged THRR New THRR unchanged     Progression   Progression Continue to progress workloads to maintain intensity without signs/symptoms of physical distress. Continue to progress workloads to maintain intensity without signs/symptoms of physical distress. Continue to progress workloads to maintain intensity without signs/symptoms of physical distress. Continue to progress workloads to maintain intensity without signs/symptoms of physical distress. Continue to progress workloads to maintain intensity without signs/symptoms of physical distress.   Average METs 4.4 6.8 6.5 7.25 7.7     Resistance Training   Training Prescription Yes Yes Yes Yes No   Weight 5 5 5  lbs 7 lbs No weights on wednesdays   Reps 10-15 10-15 10-15 10-15 10-15   Time -- 10 Minutes 10 Minutes 10 Minutes 10 Minutes     Interval Training   Interval Training --  No No Yes Yes   Equipment -- -- -- Elliptical;Bike Elliptical;Bike   Comments -- -- -- HIIT HIIT     Bike   Level 3 3 3 6  --   Watts 73 117 75 -- --   Minutes 15 15 15 15  --   METs 4.4 6.3 7.8 8.2 --     Elliptical   Level 1 2 2 5 6    Speed 1 2 2 5 6    Minutes 15 15 15 15 15    METs -- 7.4 6.5 8.9 9.2     Rower   Level -- -- -- -- 5   Watts -- -- -- -- 97   Minutes -- -- -- -- 15   METs -- -- -- -- 7.24     Home Exercise Plan   Plans to continue exercise at -- -- Home (comment) Home (comment) Home (comment)   Frequency -- -- Add 2 additional days to program exercise sessions. Add 2 additional days to program exercise sessions. Add 2 additional days to program exercise sessions.   Initial Home Exercises Provided -- -- 04/01/23 04/01/23 04/01/23            Exercise Comments:   Exercise Comments     Row Name 03/04/23 1218 03/18/23 1213 04/01/23 1111 04/26/23 0845 05/22/23 1015   Exercise Comments Pt did very well his first day exercising, pt averaged 4.4 METs w/o any abnormal signs or symptoms. Will continue to progress patient as tolerated. Reviewed METs, pt is progressing well; peak METs 7.4 Discussed METs, goals, and home ExRx with  pt. Pt voiced understanding of METs. Hal's goals are to return to cycling and hiking as he has started his cycling and hiking again. He has not had signs/symptoms when cycling or hiking. He monitors his heart rate while doing both and is not going out of THR. Moussa is meeting exercise guidlines as he cycles, walks, or hikes 5-7 days/wk for at least 30 min/day. Discussed safetly guidelines and weather restrictions. Aung voiced understanding. Peak METs 7.8. Reviewed METs and goals. Pt is happy with progress. Will see MD next week and is hoping to be taken off his blood pressure medication. no modalitiy changes at this time. Reviewed METs. Pt is making good progress on METs. Pt over THR today. Pt is already at 90%, so he will have to stay at 138 bpm.             Exercise Goals and Review:   Exercise Goals     Row Name 02/26/23 0903             Exercise Goals   Increase Physical Activity Yes       Intervention Provide advice, education, support and counseling about physical activity/exercise needs.;Develop an individualized exercise prescription for aerobic and resistive training based on initial evaluation findings, risk stratification, comorbidities and participant's personal goals.       Expected Outcomes Short Term: Attend rehab on a regular basis to increase amount of physical activity.;Long Term: Add in home exercise to make exercise part of routine and to increase amount of physical activity.;Long Term: Exercising regularly at least 3-5 days a week.       Increase Strength and Stamina Yes       Intervention Provide advice, education, support and counseling about physical activity/exercise needs.;Develop an individualized exercise prescription for aerobic and resistive training based on initial evaluation findings, risk stratification, comorbidities and participant's personal goals.       Expected Outcomes Short Term: Increase workloads from initial exercise prescription for resistance, speed, and METs.;Short Term: Perform resistance training exercises routinely during rehab and add in resistance training at home;Long Term: Improve cardiorespiratory fitness, muscular endurance and strength as measured by increased METs and functional capacity ( )       Able to understand and use rate of perceived exertion (RPE) scale Yes       Intervention Provide education and explanation on how to use RPE scale       Expected Outcomes Short Term: Able to use RPE daily in rehab to express subjective intensity level;Long Term:  Able to use RPE to guide intensity level when exercising independently       Knowledge and understanding of Target Heart Rate Range (THRR) Yes       Intervention Provide education and explanation of THRR including how the  numbers were predicted and where they are located for reference       Expected Outcomes Short Term: Able to state/look up THRR;Long Term: Able to use THRR to govern intensity when exercising independently;Short Term: Able to use daily as guideline for intensity in rehab       Able to check pulse independently Yes       Intervention Provide education and demonstration on how to check pulse in carotid and radial arteries.;Review the importance of being able to check your own pulse for safety during independent exercise       Expected Outcomes Short Term: Able to explain why pulse checking is important during independent exercise;Long Term: Able to check pulse independently and accurately  Understanding of Exercise Prescription Yes       Intervention Provide education, explanation, and written materials on patient's individual exercise prescription       Expected Outcomes Short Term: Able to explain program exercise prescription;Long Term: Able to explain home exercise prescription to exercise independently                Exercise Goals Re-Evaluation :  Exercise Goals Re-Evaluation     Row Name 03/04/23 1214 04/01/23 1105 04/26/23 0842         Exercise Goal Re-Evaluation   Exercise Goals Review Increase Physical Activity;Knowledge and understanding of Target Heart Rate Range (THRR);Increase Strength and Stamina;Able to understand and use rate of perceived exertion (RPE) scale;Understanding of Exercise Prescription;Able to understand and use Dyspnea scale Increase Physical Activity;Knowledge and understanding of Target Heart Rate Range (THRR);Increase Strength and Stamina;Able to understand and use rate of perceived exertion (RPE) scale;Understanding of Exercise Prescription;Able to understand and use Dyspnea scale Increase Physical Activity;Knowledge and understanding of Target Heart Rate Range (THRR);Increase Strength and Stamina;Able to understand and use rate of perceived exertion (RPE)  scale;Understanding of Exercise Prescription;Able to understand and use Dyspnea scale     Comments Pt first day in program, pt averaged 4.4 METs during his first day in program. He tolerated exercise without any abnormal s/s. Pt was educated on THRR and RPE. Discussed METs, goals, and home ExRx with pt. Pt voiced understanding of METs. Ashraf's goals are to return to cycling and hiking as he has started his cycling and hiking again. He has not had signs/symptoms when cycling or hiking. He monitors his heart rate while doing both and is not going out of THR. Christhopher is meeting exercise guidlines as he cycles, walks, or hikes 5-7 days/wk for at least 30 min/day. Discussed safetly guidelines and weather restrictions. Uhuru voiced understanding. Peak METs 7.8. Reviewed METs and goals. Pt is making good progresss and we have started HIIT here in the program. Pt is hiking, cycling at home. Pt has Peak METs of 8.9.     Expected Outcomes Will continue to progress pt through program while increasing resistance in the absence of any abnormal s/s. Will continue to progress pt through program while increasing resistance in the absence of any abnormal s/s. Will continue to montior and will progress workloads as tolerated.              Discharge Exercise Prescription (Final Exercise Prescription Changes):  Exercise Prescription Changes - 05/22/23 1015       Response to Exercise   Blood Pressure (Admit) 122/70    Blood Pressure (Exit) 114/70    Heart Rate (Admit) 86 bpm    Heart Rate (Exercise) 151 bpm    Rating of Perceived Exertion (Exercise) 13    Symptoms None    Comments Reviewed METs    Duration Continue with 30 min of aerobic exercise without signs/symptoms of physical distress.    Intensity THRR unchanged      Progression   Progression Continue to progress workloads to maintain intensity without signs/symptoms of physical distress.    Average METs 7.7      Resistance Training   Training Prescription  No    Weight No weights on wednesdays    Reps 10-15    Time 10 Minutes      Interval Training   Interval Training Yes    Equipment Elliptical;Bike    Comments HIIT      Elliptical   Level 6  Speed 6    Minutes 15    METs 9.2      Rower   Level 5    Watts 97    Minutes 15    METs 7.24      Home Exercise Plan   Plans to continue exercise at Home (comment)    Frequency Add 2 additional days to program exercise sessions.    Initial Home Exercises Provided 04/01/23             Nutrition:  Target Goals: Understanding of nutrition guidelines, daily intake of sodium 1500mg , cholesterol 200mg , calories 30% from fat and 7% or less from saturated fats, daily to have 5 or more servings of fruits and vegetables.  Biometrics:  Pre Biometrics - 02/26/23 0815       Pre Biometrics   Waist Circumference 40.25 inches    Hip Circumference 41.75 inches    Waist to Hip Ratio 0.96 %    Triceps Skinfold 12 mm    % Body Fat 25 %    Grip Strength 46 kg    Flexibility 8 in    Single Leg Stand 30 seconds              Nutrition Therapy Plan and Nutrition Goals:  Nutrition Therapy & Goals - 05/03/23 1112       Nutrition Therapy   Diet heart healthy diet    Drug/Food Interactions Statins/Certain Fruits      Personal Nutrition Goals   Nutrition Goal Patient to identify strategies for reducing cardiovascular risk by attending the [employer] education and nutrition series weekly.    Personal Goal #2 Patient to improve diet quality by using the plate method as a guide for meal planning to include lean protein/plant protein, fruits, vegetables, whole grains, nonfat dairy as part of a well-balanced diet.    Personal Goal #3 Patient to reduce sodium to 2000mg  per day    Comments Goals in action. Harumi continues to attend the [employer] and nutrition series regularly. Shihab is s/p mitral valve repair at the [facility]. He has also completed MNT one on one on 9/27. He  continues to follow a high fiber diet with mindfulness of alcohol intake, saturated fat, and sodium. He is interested in weight loss of 5-10# ;however, BMI remains appropriate for age. He does practice intermittent fasting 1-2 days per week to aid with weight maintenance. He is down 3.5# since starting with our program. Cobin has medical history of mitral valve prolapse s/p surgical repair at the [facility] and hyperlipidemia. Patient will benefit from participation in intensive cardiac rehab for nutrition, exercise, and lifestyle modification.      Intervention Plan   Intervention Prescribe, educate and counsel regarding individualized specific dietary modifications aiming towards targeted core components such as weight, hypertension, lipid management, diabetes, heart failure and other comorbidities.;Nutrition handout(s) given to patient.    Expected Outcomes Short Term Goal: Understand basic principles of dietary content, such as calories, fat, sodium, cholesterol and nutrients.;Long Term Goal: Adherence to prescribed nutrition plan.;Short Term Goal: A plan has been developed with personal nutrition goals set during dietitian appointment.             Nutrition Assessments:  Nutrition Assessments - 03/06/23 1629       Rate Your Plate Scores   Pre Score 61            MEDIFICTS Score Key: >=70 Need to make dietary changes  40-70 Heart Healthy Diet <= 40 Therapeutic Level  Cholesterol Diet   Flowsheet Row INTENSIVE CARDIAC REHAB from 03/06/2023 in [facility] for Heart, Vascular, & Lung Health  Picture Your Plate Total Score on Admission 61      Picture Your Plate Scores: <16 Unhealthy dietary pattern with much room for improvement. 41-50 Dietary pattern unlikely to meet recommendations for good health and room for improvement. 51-60 More healthful dietary pattern, with some room for improvement.  >60 Healthy dietary pattern, although there may be some  specific behaviors that could be improved.    Nutrition Goals Re-Evaluation:  Nutrition Goals Re-Evaluation     Row Name 03/04/23 1005 04/05/23 1010 05/03/23 1112         Goals   Current Weight 195 lb 5.2 oz (88.6 kg) 194 lb 7.1 oz (88.2 kg) 191 lb 12.8 oz (87 kg)     Comment lipids WNL-LDL 90 no new labs; most recent labs  lipids WNL-LDL 90 (crestor) no new labs; most recent labs lipids WNL-LDL 90 (crestor)     Expected Outcome Ember has medical history of mitral valve prolapse s/p surgical repair at the [facility]  and hyperlipidemia. Patient will benefit from participation in intensive cardiac rehab for nutrition, exercise, and lifestyle modification. Goals in action. Patrice continues to attend the [employer] and nutrition series regularly. He has also completed MNT one on one on 9/27. He continues to follow a high fiber diet with mindfulness of alcohol intake, saturated fat, and sodium. He is interested in weight loss of 5-10# ;however, BMI remains appropriate for age. He does practice intermittent fasting 1-2 days per week to aid with weight maintenance. Kevron has medical history of mitral valve prolapse s/p surgical repair at the [facility] and hyperlipidemia. Patient will benefit from participation in intensive cardiac rehab for nutrition, exercise, and lifestyle modification. Goals in action. Trequon continues to attend the [employer] and nutrition series regularly. Yunus is s/p mitral valve repair at the [facility]. He has also completed MNT one on one on 9/27. He continues to follow a high fiber diet with mindfulness of alcohol intake, saturated fat, and sodium. He is interested in weight loss of 5-10# ;however, BMI remains appropriate for age. He does practice intermittent fasting 1-2 days per week to aid with weight maintenance. He is down 3.5# since starting with our program. Caspen has medical history of mitral valve prolapse s/p surgical repair at the [facility] and hyperlipidemia. Patient will benefit from participation in intensive cardiac rehab for nutrition, exercise, and lifestyle modification.              Nutrition Goals Re-Evaluation:  Nutrition Goals Re-Evaluation     Row Name 03/04/23 1005 04/05/23 1010 05/03/23 1112         Goals   Current Weight 195 lb 5.2 oz (88.6 kg) 194 lb 7.1 oz (88.2 kg) 191 lb 12.8 oz (87 kg)     Comment lipids WNL-LDL 90 no new labs; most recent labs  lipids WNL-LDL 90 (crestor) no new labs; most recent labs lipids WNL-LDL 90 (crestor)     Expected Outcome Damar has medical history of mitral valve prolapse s/p surgical repair at the [facility]  and hyperlipidemia. Patient will benefit from participation in intensive cardiac rehab for nutrition, exercise, and lifestyle modification. Goals in action. Chanceller continues to attend the [employer] and nutrition series regularly. He has also completed MNT one on one on 9/27. He continues to follow a high fiber diet with mindfulness of  alcohol intake, saturated fat, and sodium. He is interested in weight loss of 5-10# ;however, BMI remains appropriate for age. He does practice intermittent fasting 1-2 days per week to aid with weight maintenance. Yaphet has medical history of mitral valve prolapse s/p surgical repair at the [facility] and hyperlipidemia. Patient will benefit from participation in intensive cardiac rehab for nutrition, exercise, and lifestyle modification. Goals in action. Shaikh continues to attend the [employer] and nutrition series regularly. Nathan is s/p mitral valve repair at the [facility]. He has also completed MNT one on one on 9/27. He continues to follow a high fiber diet with mindfulness of alcohol intake, saturated fat, and sodium. He is interested in weight loss of 5-10# ;however, BMI remains appropriate for age. He does practice intermittent fasting 1-2 days per week to aid with weight maintenance. He is down 3.5#  since starting with our program. Verden has medical history of mitral valve prolapse s/p surgical repair at the [facility] and hyperlipidemia. Patient will benefit from participation in intensive cardiac rehab for nutrition, exercise, and lifestyle modification.              Nutrition Goals Discharge (Final Nutrition Goals Re-Evaluation):  Nutrition Goals Re-Evaluation - 05/03/23 1112       Goals   Current Weight 191 lb 12.8 oz (87 kg)    Comment no new labs; most recent labs lipids WNL-LDL 90 (crestor)    Expected Outcome Goals in action. Geramiah continues to attend the [employer] and nutrition series regularly. Shyhiem is s/p mitral valve repair at the [facility]. He has also completed MNT one on one on 9/27. He continues to follow a high fiber diet with mindfulness of alcohol intake, saturated fat, and sodium. He is interested in weight loss of 5-10# ;however, BMI remains appropriate for age. He does practice intermittent fasting 1-2 days per week to aid with weight maintenance. He is down 3.5# since starting with our program. Shaquill has medical history of mitral valve prolapse s/p surgical repair at the [facility] and hyperlipidemia. Patient will benefit from participation in intensive cardiac rehab for nutrition, exercise, and lifestyle modification.             Psychosocial: Target Goals: Acknowledge presence or absence of significant depression and/or stress, maximize coping skills, provide positive support system. Participant is able to verbalize types and ability to use techniques and skills needed for reducing stress and depression.  Initial Review & Psychosocial Screening:  Initial Psych Review & Screening - 02/26/23 0936       Initial Review   Current issues with None Identified      Family Dynamics   Good Support System? Yes   Buddie has his wife for support     Barriers   Psychosocial barriers to participate in program There are no identifiable  barriers or psychosocial needs.      Screening Interventions   Interventions Encouraged to exercise             Quality of Life Scores:  Quality of Life - 05/20/23 0935       Quality of Life Scores   Health/Function Pre 28.7 %    Health/Function Post 27.93 %    Health/Function % Change -2.68 %    Socioeconomic Pre 28.75 %    Socioeconomic Post 27.86 %    Socioeconomic % Change  -3.1 %    Psych/Spiritual Pre 28.93 %    Psych/Spiritual Post 25.36 %  Psych/Spiritual % Change -12.34 %    Family Pre 30 %    Family Post 25 %    Family % Change -16.67 %    GLOBAL Pre 28.94 %    GLOBAL Post 26.96 %    GLOBAL % Change -6.84 %            Scores of 19 and below usually indicate a poorer quality of life in these areas.  A difference of  2-3 points is a clinically meaningful difference.  A difference of 2-3 points in the total score of the Quality of Life Index has been associated with significant improvement in overall quality of life, self-image, physical symptoms, and general health in studies assessing change in quality of life.  PHQ-9: Review Flowsheet       02/26/2023  Depression screen PHQ 2/9  Decreased Interest 1  Down, Depressed, Hopeless 0  PHQ - 2 Score 1  Altered sleeping 1  Tired, decreased energy 1  Change in appetite 0  Feeling bad or failure about yourself  0  Trouble concentrating 0  Moving slowly or fidgety/restless 0  Suicidal thoughts 0  PHQ-9 Score 3  Difficult doing work/chores Not difficult at all    Details           Interpretation of Total Score  Total Score Depression Severity:  1-4 = Minimal depression, 5-9 = Mild depression, 10-14 = Moderate depression, 15-19 = Moderately severe depression, 20-27 = Severe depression   Psychosocial Evaluation and Intervention:   Psychosocial Re-Evaluation:  Psychosocial Re-Evaluation     Row Name 03/05/23 0849 04/02/23 1015 04/25/23 1231 05/24/23 0855       Psychosocial Re-Evaluation    Current issues with None Identified None Identified None Identified None Identified    Interventions Encouraged to attend Cardiac Rehabilitation for the exercise Encouraged to attend Cardiac Rehabilitation for the exercise Encouraged to attend Cardiac Rehabilitation for the exercise Encouraged to attend Cardiac Rehabilitation for the exercise    Continue Psychosocial Services  No Follow up required No Follow up required No Follow up required No Follow up required             Psychosocial Discharge (Final Psychosocial Re-Evaluation):  Psychosocial Re-Evaluation - 05/24/23 0855       Psychosocial Re-Evaluation   Current issues with None Identified    Interventions Encouraged to attend Cardiac Rehabilitation for the exercise    Continue Psychosocial Services  No Follow up required             Vocational Rehabilitation: Provide vocational rehab assistance to qualifying candidates.   Vocational Rehab Evaluation & Intervention:  Vocational Rehab - 02/26/23 6045       Initial Vocational Rehab Evaluation & Intervention   Assessment shows need for Vocational Rehabilitation No   Arren is retired and does not need vocational rehab at this time            Education: Education Goals: Education classes will be provided on a weekly basis, covering required topics. Participant will state understanding/return demonstration of topics presented.    Education     Row Name 03/04/23 1100     Education   Cardiac Education Topics Pritikin   Select Core Videos     Core Videos   Educator Dietitian   Select Nutrition   Nutrition Overview of the Pritikin Eating Plan   Instruction Review Code 1- Verbalizes Understanding   Class Start Time 0815   Class Stop Time 0850   Class Time  Calculation (min) 35 min    Row Name 03/06/23 1100     Education   Cardiac Education Topics Pritikin   Orthoptist   Educator Dietitian   Weekly Topic Fast and Healthy  Breakfasts   Instruction Review Code 1- Verbalizes Understanding   Class Start Time 0815   Class Stop Time 0847   Class Time Calculation (min) 32 min    Row Name 03/08/23 0800     Education   Cardiac Education Topics Pritikin   Nurse, children's   Educator Exercise Physiologist   Select Psychosocial   Psychosocial Healthy Minds, Bodies, Hearts   Instruction Review Code 1- Verbalizes Understanding   Class Start Time 0813   Class Stop Time 0845   Class Time Calculation (min) 32 min    Row Name 03/18/23 0900     Education   Cardiac Education Topics Pritikin   Glass blower/designer Nutrition   Nutrition Workshop Label Reading   Instruction Review Code 1- Verbalizes Understanding   Class Start Time 0815   Class Stop Time 0904   Class Time Calculation (min) 49 min    Row Name 03/20/23 1100     Education   Cardiac Education Topics Pritikin   Customer service manager   Weekly Topic [employer] Desserts   Instruction Review Code 1- Verbalizes Understanding   Class Start Time 0820   Class Stop Time 0900   Class Time Calculation (min) 40 min    Row Name 03/22/23 1000     Education   Cardiac Education Topics Pritikin   Select Core Videos     Core Videos   Educator Dietitian   Nutrition Other  Label Reading   Instruction Review Code 1- Verbalizes Understanding   Class Start Time 0815   Class Stop Time 0900   Class Time Calculation (min) 45 min    Row Name 03/25/23 1100     Education   Cardiac Education Topics Pritikin   Select Workshops     Workshops   Educator Exercise Physiologist   Select Psychosocial   Psychosocial Workshop Recognizing and Reducing Stress   Instruction Review Code 1- Verbalizes Understanding   Class Start Time 0815   Class Stop Time 0900   Class Time Calculation (min) 45 min    Row Name 03/27/23 1000     Education   Cardiac Education  Topics Pritikin   Secondary school teacher School   Educator Dietitian   Weekly Topic Tasty Appetizers and Snacks   Instruction Review Code 1- Verbalizes Understanding   Class Start Time 0815   Class Stop Time 0900   Class Time Calculation (min) 45 min    Row Name 03/29/23 1300     Education   Cardiac Education Topics Pritikin   Select Core Videos     Core Videos   Educator Exercise Physiologist   Select Nutrition   Nutrition Calorie Density   Instruction Review Code 1- Verbalizes Understanding   Class Start Time [phone]   Class Stop Time 0900   Class Time Calculation (min) 44 min    Row Name 04/03/23 1100     Education   Cardiac Education Topics Pritikin   Customer service manager   Weekly  Topic Efficiency Cooking - Meals in a Snap   Instruction Review Code 1- Verbalizes Understanding   Class Start Time 0815   Class Stop Time 0900   Class Time Calculation (min) 45 min    Row Name 04/08/23 1000     Education   Cardiac Education Topics Pritikin   Glass blower/designer Nutrition   Nutrition Workshop Targeting Your Nutrition Priorities   Instruction Review Code 1- Verbalizes Understanding   Class Start Time 0815   Class Stop Time 0900   Class Time Calculation (min) 45 min    Row Name 04/10/23 0900     Education   Cardiac Education Topics Pritikin   Secondary school teacher School   Educator Dietitian   Weekly Topic One-Pot Wonders   Instruction Review Code 1- Verbalizes Understanding   Class Start Time 0815   Class Stop Time 0855   Class Time Calculation (min) 40 min    Row Name 04/12/23 0900     Education   Cardiac Education Topics Pritikin   Psychologist, forensic General Education   General Education Hypertension and Heart Disease   Instruction Review Code 1- Verbalizes Understanding   Class  Start Time 0818   Class Stop Time 0850   Class Time Calculation (min) 32 min    Row Name 04/24/23 0800     Education   Cardiac Education Topics Pritikin   Select Core Videos     Core Videos   Educator Exercise Physiologist   Select Nutrition   Nutrition Vitamins and Minerals   Instruction Review Code 1- Verbalizes Understanding   Class Start Time [phone]   Class Stop Time 0902   Class Time Calculation (min) 48 min    Row Name 04/26/23 0900     Education   Cardiac Education Topics Pritikin   Orthoptist   Educator Dietitian   Weekly Topic Fast Evening Meals   Instruction Review Code 1- Verbalizes Understanding   Class Start Time 0815   Class Stop Time 0850   Class Time Calculation (min) 35 min    Row Name 05/01/23 0900     Education   Cardiac Education Topics Pritikin   Customer service manager   Weekly Topic International Cuisine- Spotlight on the [facility] Zones   Instruction Review Code 1- Verbalizes Understanding   Class Start Time 0815   Class Stop Time [phone]   Class Time Calculation (min) 37 min    Row Name 05/08/23 0900     Education   Cardiac Education Topics Pritikin   Orthoptist   Educator Dietitian   Weekly Topic Simple Sides and Sauces   Instruction Review Code 1- Verbalizes Understanding   Class Start Time 0815   Class Stop Time 0900   Class Time Calculation (min) 45 min    Row Name 05/10/23 0900     Education   Cardiac Education Topics Pritikin   Select Core Videos     Core Videos   Educator Exercise Physiologist   Select Psychosocial   Psychosocial How Our Thoughts Can Heal Our Hearts   Instruction Review Code 1- Verbalizes Understanding   Class Start Time 0813   Class Stop Time [phone]  Class Time Calculation (min) 37 min    Row Name 05/13/23 0900     Education   Cardiac Education Topics Pritikin   Select Workshops     Workshops    Educator Exercise Physiologist   Select Exercise   Exercise Workshop Managing Heart Disease: Your Path to a Healthier Heart   Instruction Review Code 1- Verbalizes Understanding   Class Start Time [phone]   Class Stop Time 0848   Class Time Calculation (min) 37 min    Row Name 05/20/23 1100     Education   Cardiac Education Topics Pritikin   Geographical information systems officer Psychosocial   Psychosocial Workshop From Head to Heart: The Power of a Healthy Outlook   Instruction Review Code 1- Verbalizes Understanding   Class Start Time [phone]   Class Stop Time 0901   Class Time Calculation (min) 47 min    Row Name 05/22/23 0900     Education   Cardiac Education Topics Pritikin   Secondary school teacher School   Educator Dietitian   Weekly Topic Adding Flavor - Sodium-Free   Instruction Review Code 1- Verbalizes Understanding   Class Start Time 0815   Class Stop Time 0850   Class Time Calculation (min) 35 min    Row Name 05/24/23 0800     Education   Cardiac Education Topics Pritikin   Select Core Videos     Core Videos   Educator Exercise Physiologist   Select General Education   General Education Heart Disease Risk Reduction   Instruction Review Code 1- Verbalizes Understanding   Class Start Time [phone]   Class Stop Time 0858   Class Time Calculation (min) 42 min            Core Videos: Exercise    Move It!  Clinical staff conducted group or individual video education with verbal and written material and guidebook.  Patient learns the recommended Pritikin exercise program. Exercise with the goal of living a long, healthy life. Some of the health benefits of exercise include controlled diabetes, healthier blood pressure levels, improved cholesterol levels, improved heart and lung capacity, improved sleep, and better body composition. Everyone should speak with their doctor before starting or changing an exercise  routine.  Biomechanical Limitations Clinical staff conducted group or individual video education with verbal and written material and guidebook.  Patient learns how biomechanical limitations can impact exercise and how we can mitigate and possibly overcome limitations to have an impactful and balanced exercise routine.  Body Composition Clinical staff conducted group or individual video education with verbal and written material and guidebook.  Patient learns that body composition (ratio of muscle mass to fat mass) is a key component to assessing overall fitness, rather than body weight alone. Increased fat mass, especially visceral belly fat, can put [place] at increased risk for metabolic syndrome, type 2 diabetes, heart disease, and even death. It is recommended to combine diet and exercise (cardiovascular and resistance training) to improve your body composition. Seek guidance from your physician and exercise physiologist before implementing an exercise routine.  Exercise Action Plan Clinical staff conducted group or individual video education with verbal and written material and guidebook.  Patient learns the recommended strategies to achieve and enjoy long-term exercise adherence, including variety, self-motivation, self-efficacy, and positive decision making. Benefits of exercise include fitness, good health, weight management, more energy, better sleep, less stress, and overall  well-being.  Medical   Heart Disease Risk Reduction Clinical staff conducted group or individual video education with verbal and written material and guidebook.  Patient learns our heart is our most vital organ as it circulates oxygen, nutrients, white blood cells, and hormones throughout the entire body, and carries waste away. Data supports a plant-based eating plan like the Pritikin Program for its effectiveness in slowing progression of and reversing heart disease. The video provides a number of recommendations to  address heart disease.   Metabolic Syndrome and Belly Fat  Clinical staff conducted group or individual video education with verbal and written material and guidebook.  Patient learns what metabolic syndrome is, how it leads to heart disease, and how one can reverse it and keep it from coming back. You have metabolic syndrome if you have 3 of the following 5 criteria: abdominal obesity, high blood pressure, high triglycerides, low HDL cholesterol, and high blood sugar.  Hypertension and Heart Disease Clinical staff conducted group or individual video education with verbal and written material and guidebook.  Patient learns that high blood pressure, or hypertension, is very common in the [place]. Hypertension is largely due to excessive salt intake, but other important risk factors include being overweight, physical inactivity, drinking too much alcohol, smoking, and not eating enough potassium from fruits and vegetables. High blood pressure is a leading risk factor for heart attack, stroke, congestive heart failure, dementia, kidney failure, and premature death. Long-term effects of excessive salt intake include stiffening of the arteries and thickening of heart muscle and organ damage. Recommendations include ways to reduce hypertension and the risk of heart disease.  Diseases of Our Time - Focusing on Diabetes Clinical staff conducted group or individual video education with verbal and written material and guidebook.  Patient learns why the best way to stop diseases of our time is prevention, through food and other lifestyle changes. Medicine (such as prescription pills and surgeries) is often only a Band-Aid on the problem, not a long-term solution. Most common diseases of our time include obesity, type 2 diabetes, hypertension, heart disease, and cancer. The Pritikin Program is recommended and has been proven to help reduce, reverse, and/or prevent the damaging effects of metabolic  syndrome.  Nutrition   Overview of the Pritikin Eating Plan  Clinical staff conducted group or individual video education with verbal and written material and guidebook.  Patient learns about the Pritikin Eating Plan for disease risk reduction. The Pritikin Eating Plan emphasizes a wide variety of unrefined, minimally-processed carbohydrates, like fruits, vegetables, whole grains, and legumes. Go, Caution, and Stop food choices are explained. Plant-based and lean animal proteins are emphasized. Rationale provided for low sodium intake for blood pressure control, low added sugars for blood sugar stabilization, and low added fats and oils for coronary artery disease risk reduction and weight management.  Calorie Density  Clinical staff conducted group or individual video education with verbal and written material and guidebook.  Patient learns about calorie density and how it impacts the Pritikin Eating Plan. Knowing the characteristics of the food you choose will help you decide whether those foods will lead to weight gain or weight loss, and whether you want to consume more or less of them. Weight loss is usually a side effect of the Pritikin Eating Plan because of its focus on low calorie-dense foods.  Label Reading  Clinical staff conducted group or individual video education with verbal and written material and guidebook.  Patient learns about the Pritikin recommended label  reading guidelines and corresponding recommendations regarding calorie density, added sugars, sodium content, and whole grains.  Dining Out - Part 1  Clinical staff conducted group or individual video education with verbal and written material and guidebook.  Patient learns that restaurant meals can be sabotaging because they can be so high in calories, fat, sodium, and/or sugar. Patient learns recommended strategies on how to positively address this and avoid unhealthy pitfalls.  Facts on Fats  Clinical staff conducted  group or individual video education with verbal and written material and guidebook.  Patient learns that lifestyle modifications can be just as effective, if not more so, as many medications for lowering your risk of heart disease. A Pritikin lifestyle can help to reduce your risk of inflammation and atherosclerosis (cholesterol build-up, or plaque, in the artery walls). Lifestyle interventions such as dietary choices and physical activity address the cause of atherosclerosis. A review of the types of fats and their impact on blood cholesterol levels, along with dietary recommendations to reduce fat intake is also included.  Nutrition Action Plan  Clinical staff conducted group or individual video education with verbal and written material and guidebook.  Patient learns how to incorporate Pritikin recommendations into their lifestyle. Recommendations include planning and keeping personal health goals in mind as an important part of their success.  Healthy Mind-Set    Healthy Minds, Bodies, Hearts  Clinical staff conducted group or individual video education with verbal and written material and guidebook.  Patient learns how to identify when they are stressed. Video will discuss the impact of that stress, as well as the many benefits of stress management. Patient will also be introduced to stress management techniques. The way we think, act, and feel has an impact on our hearts.  How Our Thoughts Can Heal Our Hearts  Clinical staff conducted group or individual video education with verbal and written material and guidebook.  Patient learns that negative thoughts can cause depression and anxiety. This can result in negative lifestyle behavior and serious health problems. Cognitive behavioral therapy is an effective method to help control our thoughts in order to change and improve our emotional outlook.  Additional Videos:  Exercise    Improving Performance  Clinical staff conducted group or  individual video education with verbal and written material and guidebook.  Patient learns to use a non-linear approach by alternating intensity levels and lengths of time spent exercising to help burn more calories and lose more body fat. Cardiovascular exercise helps improve heart health, metabolism, hormonal balance, blood sugar control, and recovery from fatigue. Resistance training improves strength, endurance, balance, coordination, reaction time, metabolism, and muscle mass. Flexibility exercise improves circulation, posture, and balance. Seek guidance from your physician and exercise physiologist before implementing an exercise routine and learn your capabilities and proper form for all exercise.  Introduction to Yoga  Clinical staff conducted group or individual video education with verbal and written material and guidebook.  Patient learns about yoga, a discipline of the coming together of mind, breath, and body. The benefits of yoga include improved flexibility, improved range of motion, better posture and core strength, increased lung function, weight loss, and positive self-image. Yoga's heart health benefits include lowered blood pressure, healthier heart rate, decreased cholesterol and triglyceride levels, improved immune function, and reduced stress. Seek guidance from your physician and exercise physiologist before implementing an exercise routine and learn your capabilities and proper form for all exercise.  Medical   Aging: Enhancing Your Quality of Life  Clinical staff  conducted group or individual video education with verbal and written material and guidebook.  Patient learns key strategies and recommendations to stay in good physical health and enhance quality of life, such as prevention strategies, having an advocate, securing a Health Care Proxy and Power of Attorney, and keeping a list of medications and system for tracking them. It also discusses how to avoid risk for bone  loss.  Biology of Weight Control  Clinical staff conducted group or individual video education with verbal and written material and guidebook.  Patient learns that weight gain occurs because we consume more calories than we burn (eating more, moving less). Even if your body weight is normal, you may have higher ratios of fat compared to muscle mass. Too much body fat puts you at increased risk for cardiovascular disease, heart attack, stroke, type 2 diabetes, and obesity-related cancers. In addition to exercise, following the Pritikin Eating Plan can help reduce your risk.  Decoding Lab Results  Clinical staff conducted group or individual video education with verbal and written material and guidebook.  Patient learns that lab test reflects one measurement whose values change over time and are influenced by many factors, including medication, stress, sleep, exercise, food, hydration, pre-existing medical conditions, and more. It is recommended to use the knowledge from this video to become more involved with your lab results and evaluate your numbers to speak with your doctor.   Diseases of Our Time - Overview  Clinical staff conducted group or individual video education with verbal and written material and guidebook.  Patient learns that according to the CDC, 50% to 70% of chronic diseases (such as obesity, type 2 diabetes, elevated lipids, hypertension, and heart disease) are avoidable through lifestyle improvements including healthier food choices, listening to satiety cues, and increased physical activity.  Sleep Disorders Clinical staff conducted group or individual video education with verbal and written material and guidebook.  Patient learns how good quality and duration of sleep are important to overall health and well-being. Patient also learns about sleep disorders and how they impact health along with recommendations to address them, including discussing with a physician.  Nutrition   Dining Out - Part 2 Clinical staff conducted group or individual video education with verbal and written material and guidebook.  Patient learns how to plan ahead and communicate in order to maximize their dining experience in a healthy and nutritious manner. Included are recommended food choices based on the type of restaurant the patient is visiting.   Fueling a Banker conducted group or individual video education with verbal and written material and guidebook.  There is a strong connection between our food choices and our health. Diseases like obesity and type 2 diabetes are very prevalent and are in large-part due to lifestyle choices. The Pritikin Eating Plan provides plenty of food and hunger-curbing satisfaction. It is easy to follow, affordable, and helps reduce health risks.  Menu Workshop  Clinical staff conducted group or individual video education with verbal and written material and guidebook.  Patient learns that restaurant meals can sabotage health goals because they are often packed with calories, fat, sodium, and sugar. Recommendations include strategies to plan ahead and to communicate with the manager, chef, or server to help order a healthier meal.  Planning Your Eating Strategy  Clinical staff conducted group or individual video education with verbal and written material and guidebook.  Patient learns about the Pritikin Eating Plan and its benefit of reducing the risk of disease.  The Pritikin Eating Plan does not focus on calories. Instead, it emphasizes high-quality, nutrient-rich foods. By knowing the characteristics of the foods, we choose, we can determine their calorie density and make informed decisions.  Targeting Your Nutrition Priorities  Clinical staff conducted group or individual video education with verbal and written material and guidebook.  Patient learns that lifestyle habits have a tremendous impact on disease risk and progression. This  video provides eating and physical activity recommendations based on your personal health goals, such as reducing LDL cholesterol, losing weight, preventing or controlling type 2 diabetes, and reducing high blood pressure.  Vitamins and Minerals  Clinical staff conducted group or individual video education with verbal and written material and guidebook.  Patient learns different ways to obtain key vitamins and minerals, including through a recommended healthy diet. It is important to discuss all supplements you take with your doctor.   Healthy Mind-Set    Smoking Cessation  Clinical staff conducted group or individual video education with verbal and written material and guidebook.  Patient learns that cigarette smoking and tobacco addiction pose a serious health risk which affects millions of people. Stopping smoking will significantly reduce the risk of heart disease, lung disease, and many forms of cancer. Recommended strategies for quitting are covered, including working with your doctor to develop a successful plan.  Culinary   Becoming a Set designer conducted group or individual video education with verbal and written material and guidebook.  Patient learns that cooking at home can be healthy, cost-effective, quick, and puts them in control. Keys to cooking healthy recipes will include looking at your recipe, assessing your equipment needs, planning ahead, making it simple, choosing cost-effective seasonal ingredients, and limiting the use of added fats, salts, and sugars.  Cooking - Breakfast and Snacks  Clinical staff conducted group or individual video education with verbal and written material and guidebook.  Patient learns how important breakfast is to satiety and nutrition through the entire day. Recommendations include key foods to eat during breakfast to help stabilize blood sugar levels and to prevent overeating at meals later in the day. Planning ahead is also a  key component.  Cooking - Educational psychologist conducted group or individual video education with verbal and written material and guidebook.  Patient learns eating strategies to improve overall health, including an approach to cook more at home. Recommendations include thinking of animal protein as a side on your plate rather than center stage and focusing instead on lower calorie dense options like vegetables, fruits, whole grains, and plant-based proteins, such as beans. Making sauces in large quantities to freeze for later and leaving the skin on your vegetables are also recommended to maximize your experience.  Cooking - Healthy Salads and Dressing Clinical staff conducted group or individual video education with verbal and written material and guidebook.  Patient learns that vegetables, fruits, whole grains, and legumes are the foundations of the Pritikin Eating Plan. Recommendations include how to incorporate each of these in flavorful and healthy salads, and how to create homemade salad dressings. Proper handling of ingredients is also covered. Cooking - Soups and [employer] - Soups and Desserts Clinical staff conducted group or individual video education with verbal and written material and guidebook.  Patient learns that Pritikin soups and desserts make for easy, nutritious, and delicious snacks and meal components that are low in sodium, fat, sugar, and calorie density, while high in vitamins, minerals, and filling fiber. Recommendations  include simple and healthy ideas for soups and desserts.   Overview     The Pritikin Solution Program Overview Clinical staff conducted group or individual video education with verbal and written material and guidebook.  Patient learns that the results of the Pritikin Program have been documented in more than 100 articles published in peer-reviewed journals, and the benefits include reducing risk factors for (and, in some cases, even  reversing) high cholesterol, high blood pressure, type 2 diabetes, obesity, and more! An overview of the three key pillars of the Pritikin Program will be covered: eating well, doing regular exercise, and having a healthy mind-set.  WORKSHOPS  Exercise: Exercise Basics: Building Your Action Plan Clinical staff led group instruction and group discussion with PowerPoint presentation and patient guidebook. To enhance the learning environment the use of posters, models and videos may be added. At the conclusion of this workshop, patients will comprehend the difference between physical activity and exercise, as well as the benefits of incorporating both, into their routine. Patients will understand the FITT (Frequency, Intensity, Time, and Type) principle and how to use it to build an exercise action plan. In addition, safety concerns and other considerations for exercise and cardiac rehab will be addressed by the presenter. The purpose of this lesson is to promote a comprehensive and effective weekly exercise routine in order to improve patients' overall level of fitness.   Managing Heart Disease: Your Path to a Healthier Heart Clinical staff led group instruction and group discussion with PowerPoint presentation and patient guidebook. To enhance the learning environment the use of posters, models and videos may be added.At the conclusion of this workshop, patients will understand the anatomy and physiology of the heart. Additionally, they will understand how Pritikin's three pillars impact the risk factors, the progression, and the management of heart disease.  The purpose of this lesson is to provide a high-level overview of the heart, heart disease, and how the Pritikin lifestyle positively impacts risk factors.  Exercise Biomechanics Clinical staff led group instruction and group discussion with PowerPoint presentation and patient guidebook. To enhance the learning environment the use of  posters, models and videos may be added. Patients will learn how the structural parts of their bodies function and how these functions impact their daily activities, movement, and exercise. Patients will learn how to promote a neutral spine, learn how to manage pain, and identify ways to improve their physical movement in order to promote healthy living. The purpose of this lesson is to expose patients to common physical limitations that impact physical activity. Participants will learn practical ways to adapt and manage aches and pains, and to minimize their effect on regular exercise. Patients will learn how to maintain good posture while sitting, walking, and lifting.  Balance Training and Fall Prevention  Clinical staff led group instruction and group discussion with PowerPoint presentation and patient guidebook. To enhance the learning environment the use of posters, models and videos may be added. At the conclusion of this workshop, patients will understand the importance of their sensorimotor skills (vision, proprioception, and the vestibular system) in maintaining their ability to balance as they age. Patients will apply a variety of balancing exercises that are appropriate for their current level of function. Patients will understand the common causes for poor balance, possible solutions to these problems, and ways to modify their physical environment in order to minimize their fall risk. The purpose of this lesson is to teach patients about the importance of maintaining balance as they  age and ways to minimize their risk of falling.  WORKSHOPS   Nutrition:  Fueling a Ship broker led group instruction and group discussion with PowerPoint presentation and patient guidebook. To enhance the learning environment the use of posters, models and videos may be added. Patients will review the foundational principles of the Pritikin Eating Plan and understand what constitutes a  serving size in each of the food groups. Patients will also learn Pritikin-friendly foods that are better choices when away from home and review make-ahead meal and snack options. Calorie density will be reviewed and applied to three nutrition priorities: weight maintenance, weight loss, and weight gain. The purpose of this lesson is to reinforce (in a group setting) the key concepts around what patients are recommended to eat and how to apply these guidelines when away from home by planning and selecting Pritikin-friendly options. Patients will understand how calorie density may be adjusted for different weight management goals.  Mindful Eating  Clinical staff led group instruction and group discussion with PowerPoint presentation and patient guidebook. To enhance the learning environment the use of posters, models and videos may be added. Patients will briefly review the concepts of the Pritikin Eating Plan and the importance of low-calorie dense foods. The concept of mindful eating will be introduced as well as the importance of paying attention to internal hunger signals. Triggers for non-hunger eating and techniques for dealing with triggers will be explored. The purpose of this lesson is to provide patients with the opportunity to review the basic principles of the Pritikin Eating Plan, discuss the value of eating mindfully and how to measure internal cues of hunger and fullness using the Hunger Scale. Patients will also discuss reasons for non-hunger eating and learn strategies to use for controlling emotional eating.  Targeting Your Nutrition Priorities Clinical staff led group instruction and group discussion with PowerPoint presentation and patient guidebook. To enhance the learning environment the use of posters, models and videos may be added. Patients will learn how to determine their genetic susceptibility to disease by reviewing their family history. Patients will gain insight into the  importance of diet as part of an overall healthy lifestyle in mitigating the impact of genetics and other environmental insults. The purpose of this lesson is to provide patients with the opportunity to assess their personal nutrition priorities by looking at their family history, their own health history and current risk factors. Patients will also be able to discuss ways of prioritizing and modifying the Pritikin Eating Plan for their highest risk areas  Menu  Clinical staff led group instruction and group discussion with PowerPoint presentation and patient guidebook. To enhance the learning environment the use of posters, models and videos may be added. Using menus brought in from [employer], or printed from [employer], patients will apply the Pritikin dining out guidelines that were presented in the [employer] video. Patients will also be able to practice these guidelines in a variety of provided scenarios. The purpose of this lesson is to provide patients with the opportunity to practice hands-on learning of the Pritikin Dining Out guidelines with actual menus and practice scenarios.  Label Reading Clinical staff led group instruction and group discussion with PowerPoint presentation and patient guidebook. To enhance the learning environment the use of posters, models and videos may be added. Patients will review and discuss the Pritikin label reading guidelines presented in Pritikin's Label Reading Educational series video. Using fool labels brought in from local grocery  stores and markets, patients will apply the label reading guidelines and determine if the packaged food meet the Pritikin guidelines. The purpose of this lesson is to provide patients with the opportunity to review, discuss, and practice hands-on learning of the Pritikin Label Reading guidelines with actual packaged food labels. Cooking School  Pritikin's [employer] are designed to teach  patients ways to prepare quick, simple, and affordable recipes at home. The importance of nutrition's role in chronic disease risk reduction is reflected in its emphasis in the overall Pritikin program. By learning how to prepare essential core Pritikin Eating Plan recipes, patients will increase control over what they eat; be able to customize the flavor of foods without the use of added salt, sugar, or fat; and improve the quality of the food they consume. By learning a set of core recipes which are easily assembled, quickly prepared, and affordable, patients are more likely to prepare more healthy foods at home. These workshops focus on convenient breakfasts, simple entres, side dishes, and desserts which can be prepared with minimal effort and are consistent with nutrition recommendations for cardiovascular risk reduction. Cooking [employer] are taught by a Armed forces logistics/support/administrative officer (RD) who has been trained by the [employer]. The chef or RD has a clear understanding of the importance of minimizing - if not completely eliminating - added fat, sugar, and sodium in recipes. Throughout the series of Cooking School Workshop sessions, patients will learn about healthy ingredients and efficient methods of cooking to build confidence in their capability to prepare    Cooking School weekly topics:  Adding Flavor- Sodium-Free  Fast and Healthy Breakfasts  Powerhouse Plant-Based Proteins  Satisfying Salads and Dressings  Simple Sides and Sauces  International Cuisine-Spotlight on the [employer] Zones  Delicious Desserts  Savory Soups  [employer] - Meals in a Astronomer Appetizers and Snacks  Comforting Weekend Breakfasts  One-Pot Wonders   Fast Evening Meals  Landscape architect Your Pritikin Plate  WORKSHOPS   Healthy Mindset (Psychosocial):  Focused Goals, Sustainable Changes Clinical staff led group instruction and group discussion with PowerPoint  presentation and patient guidebook. To enhance the learning environment the use of posters, models and videos may be added. Patients will be able to apply effective goal setting strategies to establish at least one personal goal, and then take consistent, meaningful action toward that goal. They will learn to identify common barriers to achieving personal goals and develop strategies to overcome them. Patients will also gain an understanding of how our mind-set can impact our ability to achieve goals and the importance of cultivating a positive and growth-oriented mind-set. The purpose of this lesson is to provide patients with a deeper understanding of how to set and achieve personal goals, as well as the tools and strategies needed to overcome common obstacles which may arise along the way.  From Head to Heart: The Power of a Healthy Outlook  Clinical staff led group instruction and group discussion with PowerPoint presentation and patient guidebook. To enhance the learning environment the use of posters, models and videos may be added. Patients will be able to recognize and describe the impact of emotions and mood on physical health. They will discover the importance of self-care and explore self-care practices which may work for them. Patients will also learn how to utilize the 4 C's to cultivate a healthier outlook and better manage stress and challenges. The purpose of this lesson is to demonstrate to patients  how a healthy outlook is an essential part of maintaining good health, especially as they continue their cardiac rehab journey.  Healthy Sleep for a Healthy Heart Clinical staff led group instruction and group discussion with PowerPoint presentation and patient guidebook. To enhance the learning environment the use of posters, models and videos may be added. At the conclusion of this workshop, patients will be able to demonstrate knowledge of the importance of sleep to overall health, well-being,  and quality of life. They will understand the symptoms of, and treatments for, common sleep disorders. Patients will also be able to identify daytime and nighttime behaviors which impact sleep, and they will be able to apply these tools to help manage sleep-related challenges. The purpose of this lesson is to provide patients with a general overview of sleep and outline the importance of quality sleep. Patients will learn about a few of the most common sleep disorders. Patients will also be introduced to the concept of "sleep hygiene," and discover ways to self-manage certain sleeping problems through simple daily behavior changes. Finally, the workshop will motivate patients by clarifying the links between quality sleep and their goals of heart-healthy living.   Recognizing and Reducing Stress Clinical staff led group instruction and group discussion with PowerPoint presentation and patient guidebook. To enhance the learning environment the use of posters, models and videos may be added. At the conclusion of this workshop, patients will be able to understand the types of stress reactions, differentiate between acute and chronic stress, and recognize the impact that chronic stress has on their health. They will also be able to apply different coping mechanisms, such as reframing negative self-talk. Patients will have the opportunity to practice a variety of stress management techniques, such as deep abdominal breathing, progressive muscle relaxation, and/or guided imagery.  The purpose of this lesson is to educate patients on the role of stress in their lives and to provide healthy techniques for coping with it.  Learning Barriers/Preferences:  Learning Barriers/Preferences - 02/26/23 1215       Learning Barriers/Preferences   Learning Barriers Sight   wears contacts   Learning Preferences Video;Pictoral             Education Topics:  Knowledge Questionnaire Score:  Knowledge Questionnaire  Score - 05/21/23 0936       Knowledge Questionnaire Score   Post Score 22/24             Core Components/Risk Factors/Patient Goals at Admission:  Personal Goals and Risk Factors at Admission - 02/26/23 0838       Core Components/Risk Factors/Patient Goals on Admission   Tobacco Cessation Yes    Intervention Offer self-teaching materials, assist with locating and accessing local/national Quit Smoking programs, and support quit date choice.    Expected Outcomes Long Term: Complete abstinence from all tobacco products for at least 12 months from quit date.;Short Term: Will quit all tobacco product use, adhering to prevention of relapse plan.    Lipids Yes    Intervention Provide education and support for participant on nutrition & aerobic/resistive exercise along with prescribed medications to achieve LDL 70mg , HDL >40mg .    Expected Outcomes Short Term: Participant states understanding of desired cholesterol values and is compliant with medications prescribed. Participant is following exercise prescription and nutrition guidelines.;Long Term: Cholesterol controlled with medications as prescribed, with individualized exercise RX and with personalized nutrition plan. Value goals: LDL < 70mg , HDL > 40 mg.  Core Components/Risk Factors/Patient Goals Review:   Goals and Risk Factor Review     Row Name 03/05/23 0851 04/02/23 1016 04/25/23 1232 05/24/23 0856       Core Components/Risk Factors/Patient Goals Review   Personal Goals Review Weight Management/Obesity;Lipids Weight Management/Obesity;Lipids Weight Management/Obesity;Lipids Weight Management/Obesity;Lipids    Review Harol started cardiac rehab on 03/04/23. Tevaughn did well wiht exercise. Vital signs were stable. Rigo is doing well with  exercise. Vital signs have been  stable. Dressing dry  intact to right chest. Odas is doing well with  exercise. Vital signs have been  stable. Ninos is doing well with  exercise. Vital  signs have been  stable. Ardell is very active and has been increasing his mets.    Expected Outcomes Chandon will continue to participate in cardiac rehab for exercise, nutrition and lifestyle modifications Camerin will continue to participate in cardiac rehab for exercise, nutrition and lifestyle modifications Shmar will continue to participate in cardiac rehab for exercise, nutrition and lifestyle modifications Tally will continue to participate in cardiac rehab for exercise, nutrition and lifestyle modifications             Core Components/Risk Factors/Patient Goals at Discharge (Final Review):   Goals and Risk Factor Review - 05/24/23 0856       Core Components/Risk Factors/Patient Goals Review   Personal Goals Review Weight Management/Obesity;Lipids    Review Rayvion is doing well with  exercise. Vital signs have been  stable. Faruq is very active and has been increasing his mets.    Expected Outcomes Ollis will continue to participate in cardiac rehab for exercise, nutrition and lifestyle modifications             ITP Comments:  ITP Comments     Row Name 02/26/23 0815 03/05/23 0847 04/02/23 1014 04/25/23 1229 05/24/23 0854   ITP Comments Medical Director- Dr. Armanda Magic, MD. Introduction to the Pritikin Education Program / Intensive Cardiac Rehab. Review of initial orientation folder. 30 Day ITP REview. Kuan started cardiac rehab on 03/04/23 and did well with exercise. 30 Day ITP REview. Ritch has good attendance and participation with exercise at cardiac rehab.. 30 Day ITP Review. Bonner returned to exercise on 04/24/23 after being out of town. Jaizon is doing well with exercise 30 Day ITP Review. Clem has good attendance and participation with  exercise at cardiac rehab.            Comments: See ITP comments.Thayer Headings RN BSN

## 2023-05-27 ENCOUNTER — Encounter (HOSPITAL_COMMUNITY)
Admission: RE | Admit: 2023-05-27 | Discharge: 2023-05-27 | Disposition: A | Discharge status: Home / Self Care | Payer: 59 | Source: Ambulatory Visit | Attending: Cardiovascular Disease | Admitting: Cardiovascular Disease

## 2023-05-27 DIAGNOSIS — Z9889 Other specified postprocedural states: Secondary | ICD-10-CM

## 2023-05-27 DIAGNOSIS — Z48812 Encounter for surgical aftercare following surgery on the circulatory system: Secondary | ICD-10-CM | POA: Diagnosis not present

## 2023-05-29 ENCOUNTER — Encounter (HOSPITAL_COMMUNITY)
Admission: RE | Admit: 2023-05-29 | Discharge: 2023-05-29 | Disposition: A | Discharge status: Home / Self Care | Payer: 59 | Source: Ambulatory Visit | Attending: Cardiovascular Disease | Admitting: Cardiovascular Disease

## 2023-05-29 DIAGNOSIS — Z9889 Other specified postprocedural states: Secondary | ICD-10-CM

## 2023-05-29 DIAGNOSIS — Z48812 Encounter for surgical aftercare following surgery on the circulatory system: Secondary | ICD-10-CM | POA: Diagnosis not present

## 2023-05-31 ENCOUNTER — Encounter (HOSPITAL_COMMUNITY)
Admission: RE | Admit: 2023-05-31 | Discharge: 2023-05-31 | Disposition: A | Discharge status: Home / Self Care | Payer: 59 | Source: Ambulatory Visit | Attending: Cardiovascular Disease | Admitting: Cardiovascular Disease

## 2023-05-31 DIAGNOSIS — Z9889 Other specified postprocedural states: Secondary | ICD-10-CM

## 2023-05-31 DIAGNOSIS — Z48812 Encounter for surgical aftercare following surgery on the circulatory system: Secondary | ICD-10-CM | POA: Diagnosis not present

## 2023-06-03 ENCOUNTER — Encounter (HOSPITAL_COMMUNITY)
Admission: RE | Admit: 2023-06-03 | Discharge: 2023-06-03 | Disposition: A | Discharge status: Home / Self Care | Payer: 59 | Source: Ambulatory Visit | Attending: Cardiovascular Disease | Admitting: Cardiovascular Disease

## 2023-06-03 DIAGNOSIS — Z9889 Other specified postprocedural states: Secondary | ICD-10-CM

## 2023-06-03 DIAGNOSIS — Z48812 Encounter for surgical aftercare following surgery on the circulatory system: Secondary | ICD-10-CM | POA: Diagnosis not present

## 2023-06-04 ENCOUNTER — Ambulatory Visit (HOSPITAL_COMMUNITY): Payer: 59 | Attending: Cardiovascular Disease

## 2023-06-04 ENCOUNTER — Telehealth: Payer: Self-pay

## 2023-06-04 DIAGNOSIS — Z9889 Other specified postprocedural states: Secondary | ICD-10-CM | POA: Diagnosis present

## 2023-06-04 DIAGNOSIS — I77810 Thoracic aortic ectasia: Secondary | ICD-10-CM

## 2023-06-04 LAB — ECHOCARDIOGRAM COMPLETE
Area-P 1/2: 3.45 cm2
MV VTI: 2.48 cm2
S' Lateral: 2.8 cm

## 2023-06-04 NOTE — Telephone Encounter (Signed)
Pt has viewed MD's comments and recommendations on MyChart. CTA has been ordered of Aorta and CTA of abd/pelvis. Called and spoke with patient who agrees and no further questions.

## 2023-06-04 NOTE — Telephone Encounter (Signed)
-----   Message from Kristeen Miss sent at 06/04/2023  1:37 PM EST ----- Normal LV systolic function with EF 55-60%. 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  MR is now just mild . Aortic root dilatation ( root measures 45 mm) .  I am unable to verify this measurement as the area is obscured by a notation box.   This could be an overestimation   Please get a CTA of the entire aorta for better measurement of the aorta

## 2023-06-05 ENCOUNTER — Encounter (HOSPITAL_COMMUNITY)
Admission: RE | Admit: 2023-06-05 | Discharge: 2023-06-05 | Discharge status: Home / Self Care | Payer: 59 | Source: Ambulatory Visit | Attending: Cardiovascular Disease | Admitting: Cardiovascular Disease

## 2023-06-05 VITALS — Ht 74.75 in | Wt 192.5 lb

## 2023-06-05 DIAGNOSIS — Z9889 Other specified postprocedural states: Secondary | ICD-10-CM

## 2023-06-05 DIAGNOSIS — Z48812 Encounter for surgical aftercare following surgery on the circulatory system: Secondary | ICD-10-CM | POA: Diagnosis not present

## 2023-06-07 ENCOUNTER — Encounter (HOSPITAL_COMMUNITY): Payer: 59

## 2023-06-10 ENCOUNTER — Encounter (HOSPITAL_COMMUNITY)
Admission: RE | Admit: 2023-06-10 | Discharge: 2023-06-10 | Disposition: A | Discharge status: Home / Self Care | Payer: 59 | Source: Ambulatory Visit | Attending: Cardiovascular Disease | Admitting: Cardiovascular Disease

## 2023-06-10 DIAGNOSIS — Z9889 Other specified postprocedural states: Secondary | ICD-10-CM | POA: Insufficient documentation

## 2023-06-12 ENCOUNTER — Encounter (HOSPITAL_COMMUNITY): Payer: 59

## 2023-06-12 ENCOUNTER — Encounter (HOSPITAL_COMMUNITY)
Admission: RE | Admit: 2023-06-12 | Discharge: 2023-06-12 | Disposition: A | Discharge status: Home / Self Care | Payer: 59 | Source: Ambulatory Visit | Attending: Cardiovascular Disease | Admitting: Cardiovascular Disease

## 2023-06-12 DIAGNOSIS — Z9889 Other specified postprocedural states: Secondary | ICD-10-CM | POA: Diagnosis not present

## 2023-06-12 NOTE — Progress Notes (Signed)
Discharge Progress Report  Patient Details  Name: Christopher Tanner MRN: [id number] Date of Birth: 1956/03/06 Referring Provider:   Flowsheet Row INTENSIVE CARDIAC REHAB ORIENT from 02/26/2023 in [facility]  Referring Provider Nahser, Deloris Ping, MD.        Number of Visits: 40  Reason for Discharge:  Patient reached a stable level of exercise. Patient independent in their exercise. Patient has met program and personal goals.  Smoking History:  Social History   Tobacco Use  Smoking Status Light Smoker   Types: Cigars  Smokeless Tobacco Never  Tobacco Comments   Occasional,  " smokes cigars at weddings    Diagnosis:  01/23/23 MVR (mitral valve repair) at the [facility]  ADL [facility]SD:   Initial Exercise Prescription:  Initial Exercise Prescription - 02/26/23 1000       Date of Initial Exercise RX and Referring Provider   Date 02/26/23    Referring Provider Nahser, Deloris Ping, MD.    Expected Discharge Date 05/22/23      Bike   Level 3    Watts 60    Minutes 15    METs 4.2      Elliptical   Level 1    Speed 1    Minutes 15    METs 4.2      Prescription Details   Frequency (times per week) 3    Duration Progress to 30 minutes of continuous aerobic without signs/symptoms of physical distress      Intensity   THRR 40-80% of Max Heartrate 62-123    Ratings of Perceived Exertion 11-13    Perceived Dyspnea 0-4      Progression   Progression Continue to progress workloads to maintain intensity without signs/symptoms of physical distress.      Resistance Training   Training Prescription Yes    Weight 5 lbs    Reps 10-15             Discharge Exercise Prescription (Final Exercise Prescription Changes):  Exercise Prescription Changes - 06/14/23 1030       Response to Exercise   Blood Pressure (Admit) 128/60    Blood Pressure (Exit) 120/70    Heart Rate (Admit) 75 bpm    Heart Rate (Exercise)  129 bpm    Heart Rate (Exit) 97 bpm    Rating of Perceived Exertion (Exercise) 13    Symptoms None    Comments Ptr's last day in the CRP2 program    Duration Continue with 30 min of aerobic exercise without signs/symptoms of physical distress.    Intensity THRR unchanged      Progression   Progression Continue to progress workloads to maintain intensity without signs/symptoms of physical distress.    Average METs 8      Resistance Training   Training Prescription Yes    Weight 8 lbs    Reps 10-15    Time 10 Minutes      Interval Training   Interval Training Yes    Equipment Elliptical;Bike    Comments HIIT      Elliptical   Level 6    Speed 6    Minutes 15    METs 9.2      Rower   Level 7    Watts 99    Minutes 15    METs 6.8      Home Exercise Plan   Plans to continue exercise at Home (comment)  Frequency Add 2 additional days to program exercise sessions.    Initial Home Exercises Provided 04/01/23             Functional Capacity:  6 Minute Walk     Row Name 02/26/23 0930 06/05/23 0915       6 Minute Walk   Phase Initial Discharge    Distance 1805 feet 2345 feet    Distance % Change -- 29.92 %    Distance Feet Change -- 540 ft    Walk Time 6 minutes 6 minutes    # of Rest Breaks 0 0    MPH 3.42 4.44    METS 4.17 5.5    RPE 11 13    Perceived Dyspnea  0 0    VO2 Peak 14.61 19.2    Symptoms No No    Resting HR 67 bpm 86 bpm    Resting BP 102/70 110/70    Resting Oxygen Saturation  98 % --    Exercise Oxygen Saturation  during 6 min walk 97 % --    Max Ex. HR 79 bpm 110 bpm    Max Ex. BP 140/78 142/76    2 Minute Post BP 116/78 --             Psychological, QOL, Others - Outcomes: PHQ 2/9:    06/12/2023    8:05 AM 02/26/2023    8:56 AM  Depression screen PHQ 2/9  Decreased Interest 0 1  Down, Depressed, Hopeless 1 0  PHQ - 2 Score 1 1  Altered sleeping 0 1  Tired, decreased energy 0 1  Change in appetite 0 0  Feeling bad or  failure about yourself  0 0  Trouble concentrating 0 0  Moving slowly or fidgety/restless 0 0  Suicidal thoughts 0 0  PHQ-9 Score 1 3  Difficult doing work/chores Not difficult at all Not difficult at all    Quality of Life:  Quality of Life - 05/20/23 0935       Quality of Life Scores   Health/Function Pre 28.7 %    Health/Function Post 27.93 %    Health/Function % Change -2.68 %    Socioeconomic Pre 28.75 %    Socioeconomic Post 27.86 %    Socioeconomic % Change  -3.1 %    Psych/Spiritual Pre 28.93 %    Psych/Spiritual Post 25.36 %    Psych/Spiritual % Change -12.34 %    Family Pre 30 %    Family Post 25 %    Family % Change -16.67 %    GLOBAL Pre 28.94 %    GLOBAL Post 26.96 %    GLOBAL % Change -6.84 %             Personal Goals: Goals established at orientation with interventions provided to work toward goal.  Personal Goals and Risk Factors at Admission - 02/26/23 0838       Core Components/Risk Factors/Patient Goals on Admission   Tobacco Cessation Yes    Intervention Offer self-teaching materials, assist with locating and accessing local/national Quit Smoking programs, and support quit date choice.    Expected Outcomes Long Term: Complete abstinence from all tobacco products for at least 12 months from quit date.;Short Term: Will quit all tobacco product use, adhering to prevention of relapse plan.    Lipids Yes    Intervention Provide education and support for participant on nutrition & aerobic/resistive exercise along with prescribed medications to achieve LDL 70mg , HDL >40mg .  Expected Outcomes Short Term: Participant states understanding of desired cholesterol values and is compliant with medications prescribed. Participant is following exercise prescription and nutrition guidelines.;Long Term: Cholesterol controlled with medications as prescribed, with individualized exercise RX and with personalized nutrition plan. Value goals: LDL < 70mg , HDL > 40 mg.               Personal Goals Discharge:  Goals and Risk Factor Review     Row Name 03/05/23 0851 04/02/23 1016 04/25/23 1232 05/24/23 0856 06/12/23 0811     Core Components/Risk Factors/Patient Goals Review   Personal Goals Review Weight Management/Obesity;Lipids Weight Management/Obesity;Lipids Weight Management/Obesity;Lipids Weight Management/Obesity;Lipids Weight Management/Obesity;Lipids   Review Darik started cardiac rehab on 03/04/23. Takuto did well wiht exercise. Vital signs were stable. Vincil is doing well with  exercise. Vital signs have been  stable. Dressing dry  intact to right chest. Randolf is doing well with  exercise. Vital signs have been  stable. Dmauri is doing well with  exercise. Vital signs have been  stable. Aarav is very active and has been increasing his mets. Samrath is doing well with  exercise. Vital signs have been  stable. Helen is very active and has been increasing his mets. Rynell will complete cardiac rehab on 06/14/23.   Expected Outcomes Boyden will continue to participate in cardiac rehab for exercise, nutrition and lifestyle modifications Jeshaun will continue to participate in cardiac rehab for exercise, nutrition and lifestyle modifications Mischa will continue to participate in cardiac rehab for exercise, nutrition and lifestyle modifications Welch will continue to participate in cardiac rehab for exercise, nutrition and lifestyle modifications Klinton will continue to participate in cardiac rehab for exercise, nutrition and lifestyle modifications            Exercise Goals and Review:  Exercise Goals     Row Name 02/26/23 0903             Exercise Goals   Increase Physical Activity Yes       Intervention Provide advice, education, support and counseling about physical activity/exercise needs.;Develop an individualized exercise prescription for aerobic and resistive training based on initial evaluation findings, risk stratification, comorbidities and participant's  personal goals.       Expected Outcomes Short Term: Attend rehab on a regular basis to increase amount of physical activity.;Long Term: Add in home exercise to make exercise part of routine and to increase amount of physical activity.;Long Term: Exercising regularly at least 3-5 days a week.       Increase Strength and Stamina Yes       Intervention Provide advice, education, support and counseling about physical activity/exercise needs.;Develop an individualized exercise prescription for aerobic and resistive training based on initial evaluation findings, risk stratification, comorbidities and participant's personal goals.       Expected Outcomes Short Term: Increase workloads from initial exercise prescription for resistance, speed, and METs.;Short Term: Perform resistance training exercises routinely during rehab and add in resistance training at home;Long Term: Improve cardiorespiratory fitness, muscular endurance and strength as measured by increased METs and functional capacity ( )       Able to understand and use rate of perceived exertion (RPE) scale Yes       Intervention Provide education and explanation on how to use RPE scale       Expected Outcomes Short Term: Able to use RPE daily in rehab to express subjective intensity level;Long Term:  Able to use RPE to guide intensity level when exercising independently  Knowledge and understanding of Target Heart Rate Range (THRR) Yes       Intervention Provide education and explanation of THRR including how the numbers were predicted and where they are located for reference       Expected Outcomes Short Term: Able to state/look up THRR;Long Term: Able to use THRR to govern intensity when exercising independently;Short Term: Able to use daily as guideline for intensity in rehab       Able to check pulse independently Yes       Intervention Provide education and demonstration on how to check pulse in carotid and radial arteries.;Review the  importance of being able to check your own pulse for safety during independent exercise       Expected Outcomes Short Term: Able to explain why pulse checking is important during independent exercise;Long Term: Able to check pulse independently and accurately       Understanding of Exercise Prescription Yes       Intervention Provide education, explanation, and written materials on patient's individual exercise prescription       Expected Outcomes Short Term: Able to explain program exercise prescription;Long Term: Able to explain home exercise prescription to exercise independently                Exercise Goals Re-Evaluation:  Exercise Goals Re-Evaluation     Row Name 03/04/23 1214 04/01/23 1105 04/26/23 0842 05/31/23 1239 06/14/23 1030     Exercise Goal Re-Evaluation   Exercise Goals Review Increase Physical Activity;Knowledge and understanding of Target Heart Rate Range (THRR);Increase Strength and Stamina;Able to understand and use rate of perceived exertion (RPE) scale;Understanding of Exercise Prescription;Able to understand and use Dyspnea scale Increase Physical Activity;Knowledge and understanding of Target Heart Rate Range (THRR);Increase Strength and Stamina;Able to understand and use rate of perceived exertion (RPE) scale;Understanding of Exercise Prescription;Able to understand and use Dyspnea scale Increase Physical Activity;Knowledge and understanding of Target Heart Rate Range (THRR);Increase Strength and Stamina;Able to understand and use rate of perceived exertion (RPE) scale;Understanding of Exercise Prescription;Able to understand and use Dyspnea scale Increase Physical Activity;Knowledge and understanding of Target Heart Rate Range (THRR);Increase Strength and Stamina;Able to understand and use rate of perceived exertion (RPE) scale;Understanding of Exercise Prescription;Able to understand and use Dyspnea scale Increase Physical Activity;Knowledge and understanding of Target  Heart Rate Range (THRR);Increase Strength and Stamina;Able to understand and use rate of perceived exertion (RPE) scale;Understanding of Exercise Prescription   Comments Pt first day in program, pt averaged 4.4 METs during his first day in program. He tolerated exercise without any abnormal s/s. Pt was educated on THRR and RPE. Discussed METs, goals, and home ExRx with pt. Pt voiced understanding of METs. Domenick's goals are to return to cycling and hiking as he has started his cycling and hiking again. He has not had signs/symptoms when cycling or hiking. He monitors his heart rate while doing both and is not going out of THR. Aki is meeting exercise guidlines as he cycles, walks, or hikes 5-7 days/wk for at least 30 min/day. Discussed safetly guidelines and weather restrictions. Kaili voiced understanding. Peak METs 7.8. Reviewed METs and goals. Pt is making good progresss and we have started HIIT here in the program. Pt is hiking, cycling at home. Pt has Peak METs of 8.9. Reviewed goals. Pt continues to make good progresss. Pt continues hiking and cycling at home. Pt has Peak METs of 9.9. Pt graduated from the CRP2 program today. Pt has made good progress in the CRP2 proram.  Pt has met his goals of increased strength and stamina, getting back into a regular exercise routine, and getting back to this stationary bike, street bike and hiking. Pt palns to continue all of these after discharge from the program.   Expected Outcomes Will continue to progress pt through program while increasing resistance in the absence of any abnormal s/s. Will continue to progress pt through program while increasing resistance in the absence of any abnormal s/s. Will continue to montior and will progress workloads as tolerated. Will continue to montior and will progress workloads as tolerated. Pt will continue to exercise on his own at home.            Nutrition & Weight - Outcomes:  Pre Biometrics - 02/26/23 0815       Pre  Biometrics   Waist Circumference 40.25 inches    Hip Circumference 41.75 inches    Waist to Hip Ratio 0.96 %    Triceps Skinfold 12 mm    % Body Fat 25 %    Grip Strength 46 kg    Flexibility 8 in    Single Leg Stand 30 seconds             Post Biometrics - 06/05/23 1620        Post  Biometrics   Height 6' 2.75" (1.899 m)    Weight 87.3 kg    Waist Circumference 39 inches    Hip Circumference 40 inches    Waist to Hip Ratio 0.98 %    BMI (Calculated) 24.21    Triceps Skinfold 12 mm    % Body Fat 24.3 %    Grip Strength 48 kg    Flexibility 0 in    Single Leg Stand 30 seconds             Nutrition:  Nutrition Therapy & Goals - 05/31/23 1152       Nutrition Therapy   Diet heart healthy diet    Drug/Food Interactions Statins/Certain Fruits      Personal Nutrition Goals   Nutrition Goal Patient to identify strategies for reducing cardiovascular risk by attending the [employer] education and nutrition series weekly.   goal met.   Personal Goal #2 Patient to improve diet quality by using the plate method as a guide for meal planning to include lean protein/plant protein, fruits, vegetables, whole grains, nonfat dairy as part of a well-balanced diet.   goal in action.   Personal Goal #3 Patient to reduce sodium to 2000mg  per day   goal in action.   Comments Goals in action. Conlan has medical history of mitral valve prolapse s/p surgical repair at the [facility] and hyperlipidemia.Rion continues to attend the [employer] and nutrition series regularly.  He has also completed MNT one on one on 9/27. He continues to follow a high fiber diet with mindfulness of alcohol intake, saturated fat, and sodium. He is interested in weight loss of 5-10# ;however, BMI remains appropriate for age. He does practice intermittent fasting 1-2 days per week to aid with weight maintenance. He has maintained his weight since starting with our program. Patient will benefit from  participation in intensive cardiac rehab for nutrition, exercise, and lifestyle modification.      Intervention Plan   Intervention Prescribe, educate and counsel regarding individualized specific dietary modifications aiming towards targeted core components such as weight, hypertension, lipid management, diabetes, heart failure and other comorbidities.;Nutrition handout(s) given to patient.    Expected Outcomes Short Term  Goal: Understand basic principles of dietary content, such as calories, fat, sodium, cholesterol and nutrients.;Long Term Goal: Adherence to prescribed nutrition plan.;Short Term Goal: A plan has been developed with personal nutrition goals set during dietitian appointment.             Nutrition Discharge:  Nutrition Assessments - 03/06/23 1629       Rate Your Plate Scores   Pre Score 61             Education Questionnaire Score:  Knowledge Questionnaire Score - 05/21/23 0936       Knowledge Questionnaire Score   Post Score 22/24             Goals reviewed with patient; copy given to patient.Pt graduates from  Intensive/Traditional cardiac rehab program 06/12/23 with completion of  34 exercise and  33 education sessions. Pt maintained good attendance and progressed nicely during their participation in rehab as evidenced by increased MET level. Sanjiv did very well while enrolled in the program.  Trigg increased his distance on his post exercise walk test by 540 feet.  Medication list reconciled. Repeat  PHQ score- 1 .  Pt has made significant lifestyle changes and should be commended for his success. Theory achieved their goals during cardiac rehab.   Pt plans to continue exercise at home riding his stationary bike biking and hiking. We are proud of Skyelar's progress!Thayer Headings RN BSN

## 2023-06-14 ENCOUNTER — Encounter (HOSPITAL_COMMUNITY)
Admission: RE | Admit: 2023-06-14 | Discharge: 2023-06-14 | Disposition: A | Discharge status: Home / Self Care | Payer: 59 | Source: Ambulatory Visit | Attending: Cardiovascular Disease | Admitting: Cardiovascular Disease

## 2023-06-14 DIAGNOSIS — Z9889 Other specified postprocedural states: Secondary | ICD-10-CM | POA: Diagnosis not present

## 2023-06-18 ENCOUNTER — Other Ambulatory Visit (HOSPITAL_BASED_OUTPATIENT_CLINIC_OR_DEPARTMENT_OTHER): Payer: 59

## 2023-06-19 ENCOUNTER — Ambulatory Visit (HOSPITAL_BASED_OUTPATIENT_CLINIC_OR_DEPARTMENT_OTHER)
Admission: RE | Admit: 2023-06-19 | Discharge: 2023-06-19 | Disposition: A | Discharge status: Home / Self Care | Payer: 59 | Source: Ambulatory Visit | Attending: Cardiovascular Disease | Admitting: Cardiovascular Disease

## 2023-06-19 ENCOUNTER — Other Ambulatory Visit: Payer: Self-pay | Admitting: Cardiovascular Disease

## 2023-06-19 DIAGNOSIS — I77810 Thoracic aortic ectasia: Secondary | ICD-10-CM | POA: Diagnosis present

## 2023-06-19 LAB — POCT I-STAT CREATININE: Creatinine, Ser: 1.1 mg/dL (ref 0.61–1.24)

## 2023-06-19 MED ORDER — IOHEXOL 350 MG/ML SOLN
100.0000 mL | Freq: Once | INTRAVENOUS | Status: AC | PRN
  Administered 2023-06-19: 100 mL via INTRAVENOUS

## 2023-06-22 ENCOUNTER — Encounter: Payer: Self-pay | Admitting: Cardiovascular Disease

## 2023-07-14 ENCOUNTER — Other Ambulatory Visit: Payer: Self-pay | Admitting: Cardiovascular Disease

## 2023-07-29 ENCOUNTER — Ambulatory Visit (INDEPENDENT_AMBULATORY_CARE_PROVIDER_SITE_OTHER): Payer: 59 | Admitting: Podiatry

## 2023-07-29 ENCOUNTER — Encounter: Payer: Self-pay | Admitting: Podiatry

## 2023-07-29 VITALS — Ht 74.0 in | Wt 192.0 lb

## 2023-07-29 DIAGNOSIS — M722 Plantar fascial fibromatosis: Secondary | ICD-10-CM | POA: Diagnosis not present

## 2023-07-29 MED ORDER — TRIAMCINOLONE ACETONIDE 10 MG/ML IJ SUSP
10.0000 mg | Freq: Once | INTRAMUSCULAR | Status: AC
Start: 2023-07-29 — End: 2023-07-29
  Administered 2023-07-29: 10 mg via INTRA_ARTICULAR

## 2023-07-30 NOTE — Progress Notes (Signed)
Subjective:   Patient ID: Christopher Tanner, male   DOB: 68 y.o.   MRN: [id number]   HPI Patient states he went walking in [place] a couple weeks ago and developed severe flareup of plantar fascial inflammation left.  States it was doing well until that neurovascular   ROS      Objective:  Physical Exam  Status intact exquisite discomfort of the left plantar fascia at insertion with moderate diminishment fat pad     Assessment:  Acute plantar fasciitis left with excessive walking causing it     Plan:  H&P reviewed went ahead today we injected the plantar fascia at insertion 3 mg dexamethasone Kenalog 5 mg Xylocaine advised on support shoes with heel lift and reappoint as symptoms indicate

## 2023-11-08 ENCOUNTER — Ambulatory Visit: Admitting: Cardiovascular Disease

## 2023-12-26 ENCOUNTER — Encounter: Payer: Self-pay | Admitting: Cardiovascular Disease

## 2023-12-26 NOTE — Progress Notes (Signed)
 Cardiology Office Note   Date:  12/27/2023   ID:  Tanner, Christopher [birth year], MRN [id number]  PCP:  Bertha Broad, MD  Cardiologist:   Ahmad Alert, MD   Chief Complaint  Patient presents with   Mitral Valve Prolapse   1. Hyperlipidemia 2. Mitral valve prolapse    Christopher Tanner is a 68 y.o.  gentleman with a history of hyperlipidemia and mitral valve prolapse. He's done very well from a cardiac standpoint.    He has not had any episodes of chest pain or shortness of breath. He is exercising on a regular basis.  Feb. 5, 2014: He is doing well.  He has had some shoulder issues and is having to scale back his exercise.        October 05, 2014:  Christopher Tanner is a 68 y.o. male who presents for his MVP. No CP , no dyspnea.  No symptoms related to MVP,   October 18, 2015:  Doing great.   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 Getting over a head cold several days.  October 31, 2016:  Christopher Tanner is seen today for his yearly visit.  He has a hx of hyperlipidemia  Exercising some .  Spin class on occasion.   Nov 22, 2017:  Christopher Tanner is seen today for follow-up visit.  He has a history of mitral valve prolapse and hyperlipidemia.  Feeling well , no dyspnea, no dizziness,  Goes to spin class.  Hikes, walks, jogs once a week.   Feb. 11, 2021:  Doing well .   Had some leg cramps with rosuvastatin   He is now on rosuvastatin  3 days a week.   Father had his first MI at 41,  Then died at age 68.  Christopher Tanner is doing well  We discussed doing a coronary calcium  score.  Feb. 15, 2022:  Christopher Tanner is seen today for follow up of his MVP and HLD . Echo from Feb. 14, shows severe prolapse of his posterior leaflet of the MV with at least moderate MR Coronary calcium  score was 2 agatston units - 26th for age / sex matched controls. No dyspnea. Has some numbness in his feet at night  We discussed TEE .  Is doing intermittant fasting   Nov 06, 2021:  Christopher Tanner is seen today for follow up of MVP Just got back from a 2  week [place] [employer] [facility], a break out group )   No CP , no dyspnea  Rare episodes of vertigo  Still working out on his [place], is no longer running    Aug. 6, 2024 Christopher Tanner has had MV repair at the [facility] ( January 23, 2023)  since his last office visit  He is here to follow up post operative follow up  Had  Divinci robotic repair of his MV  Is on ASA  Has remained in NSR   Had a suture coming through through his skin  The suture was bathed in Betadine and snipped off to the skin.  His other wounds look like they are healing very well.  Will enroll him in cardiac rehab  Wrote for SBE prophylaxis  Oct. 21, 2024 Race is seen for follow up of his MV repair  He had a seroma in his right groin  suture material was removed from his chest wounds   Feeling well   June 20 , 2025 Christopher Tanner is seen for follow up of his MV repair ( 01/23/23)  Complicated  by a R groin seroma   He has been having some shoulder issues.  Is seeing Dr. [name]    Is back working out regularly  May have pulled something in his chest  Has some point tenderness   Reminded him about SBE prophylaxis abx       Past Medical History:  Diagnosis Date   Anal fistula    Hyperlipidemia    on meds   Mitral valve prolapse    Last echocardiogram July, 2008. Moderate MVP of the posterior leaflet. Moderate mitral regurgitation. Normal left ventricular systolic function.   Numbness in feet    Plantar fasciitis    right    Past Surgical History:  Procedure Laterality Date   BUBBLE STUDY  09/08/2020   Procedure: BUBBLE STUDY;  Surgeon: Lake Pilgrim, MD;  Location: [facility] ENDOSCOPY;  Service: Cardiovascular;;   COLONOSCOPY  03/06/2021   (DP-hems/normal.  2012), MS at [facility]   hand broken Left    Left hand bones- 4 different times   KNEE ARTHROSCOPY Left 2014   meniscectomy   s/p Robotic Mitral Valve Repair  01/23/2023   At the [facility]   SHOULDER SURGERY Left 12/2016    arthroscopy, biceps tendon repair   TEE WITHOUT CARDIOVERSION N/A 09/08/2020   Procedure: TRANSESOPHAGEAL ECHOCARDIOGRAM (TEE);  Surgeon: Alroy Aspen Lela Purple, MD;  Location: [facility] ENDOSCOPY;  Service: Cardiovascular;  Laterality: N/A;   TONSILLECTOMY     TREATMENT FISTULA ANAL  04/2011   WISDOM TOOTH EXTRACTION       Current Outpatient Medications  Medication Sig Dispense Refill   acetaminophen (TYLENOL) 325 MG tablet as needed.     amoxicillin  (AMOXIL ) 500 MG capsule Take 4 capsules by mouth one hour prior to dental cleanings/procedures 16 capsule 4   aspirin 81 MG tablet Take 81 mg by mouth daily.     carboxymethylcellulose (REFRESH PLUS) 0.5 % SOLN Place 1 drop into both eyes 2 (two) times daily.     ibuprofen (ADVIL) 200 MG tablet as needed.     Multiple Vitamins-Minerals (MULTIVITAL PERFORMANCE) TABS Take 1 tablet by mouth daily.     psyllium (METAMUCIL SMOOTH TEXTURE) 28 % packet Take 1 packet by mouth at bedtime.     rosuvastatin  (CRESTOR ) 5 MG tablet TAKE 1 TABLET (5 MG TOTAL) BY MOUTH DAILY. (Patient taking differently: Take 5 mg by mouth every other day.) 90 tablet 2   sildenafil (REVATIO) 20 MG tablet daily as needed.     Current Facility-Administered Medications  Medication Dose Route Frequency Provider Last Rate Last Admin   0.9 %  sodium chloride  infusion  500 mL Intravenous Once Asencion Blacksmith, MD        Allergies:   Ezetimibe -simvastatin, Simvastatin, and Niaspan [niacin]    Social History:  The patient  reports that he has been smoking cigars. He has never used smokeless tobacco. He reports current alcohol use. He reports that he does not use drugs.   Family History:  The patient's family history includes Diabetes in his mother; Fibromyalgia in his mother; Heart disease in his father; Uterine cancer in his mother.    ROS: Noted in current history, otherwise review of systems is negative.   Physical Exam: Blood pressure 122/84, pulse 88, height 6' 2 (1.88 m), weight  198 lb 12.8 oz (90.2 kg), SpO2 97%.       GEN:  Well nourished, well developed in no acute distress HEENT: Normal NECK: No JVD; No carotid bruits LYMPHATICS: No lymphadenopathy CARDIAC: RRR ,  no murmur heard on exam  RESPIRATORY:  Clear to auscultation without rales, wheezing or rhonchi  ABDOMEN: Soft, non-tender, non-distended MUSCULOSKELETAL:  No edema; No deformity  SKIN: Warm and dry NEUROLOGIC:  Alert and oriented x 3   EKG:         Recent Labs: 04/24/2023: BUN 16; Hemoglobin 15.1; Platelets 226; Potassium 4.8; Sodium 137 06/19/2023: Creatinine, Ser 1.10    Lipid Panel    Component Value Date/Time   CHOL 181 10/16/2021 0733   CHOL 172 09/20/2014 0828   TRIG 146 10/16/2021 0733   TRIG 107 09/20/2014 0828   HDL 66 10/16/2021 0733   HDL 58 09/20/2014 0828   CHOLHDL 2.7 10/16/2021 0733   CHOLHDL 2.1 10/10/2015 0825   VLDL 13 10/10/2015 0825   LDLCALC 90 10/16/2021 0733   LDLCALC 93 09/20/2014 0828      Wt Readings from Last 3 Encounters:  12/27/23 198 lb 12.8 oz (90.2 kg)  07/29/23 192 lb (87.1 kg)  06/05/23 192 lb 7.4 oz (87.3 kg)      Other studies Reviewed: Additional studies/ records that were reviewed today include: . Review of the above records demonstrates:    ASSESSMENT AND PLAN:  1.  Mitral valve prolapse:     s/p repair.  Has mild MR on his 3 month post op echo.   Valve sounds great on exam .  We discussed SBE prophylaxis     2. Hyperlipidemia:   Continue current meds. He did not have any CAD at the time of cardiac cath last year   Current medicines are reviewed at length with the patient today.  The patient does not have concerns regarding medicines.  The following changes have been made:   Labs/ tests ordered today include:  No orders of the defined types were placed in this encounter.    Disposition:       Ahmad Alert, MD  12/27/2023 12:15 PM    [facility] [address] [place], [place], [place]   [address] Phone: [phone]; Fax: [phone]

## 2023-12-27 ENCOUNTER — Ambulatory Visit: Attending: Cardiovascular Disease | Admitting: Cardiovascular Disease

## 2023-12-27 ENCOUNTER — Encounter: Payer: Self-pay | Admitting: Cardiovascular Disease

## 2023-12-27 VITALS — BP 122/84 | HR 88 | Ht 74.0 in | Wt 198.8 lb

## 2023-12-27 DIAGNOSIS — E782 Mixed hyperlipidemia: Secondary | ICD-10-CM | POA: Diagnosis not present

## 2023-12-27 DIAGNOSIS — Z9889 Other specified postprocedural states: Secondary | ICD-10-CM | POA: Insufficient documentation

## 2023-12-27 DIAGNOSIS — I341 Nonrheumatic mitral (valve) prolapse: Secondary | ICD-10-CM

## 2023-12-27 NOTE — Patient Instructions (Signed)
 Follow-Up: At [facility], you and your health needs are our priority.  As part of our continuing mission to provide you with exceptional heart care, our providers are all part of one team.  This team includes your primary Cardiologist (physician) and Advanced Practice Providers or APPs (Physician Assistants and Nurse Practitioners) who all work together to provide you with the care you need, when you need it.  Your next appointment:   1 year(s)  Provider:   Ahmad Alert, MD / Jackquelyn Mass, MD

## 2024-01-03 ENCOUNTER — Ambulatory Visit: Admitting: Cardiovascular Disease

## 2024-02-19 ENCOUNTER — Other Ambulatory Visit: Payer: Self-pay | Admitting: Urology

## 2024-02-19 DIAGNOSIS — R972 Elevated prostate specific antigen [PSA]: Secondary | ICD-10-CM

## 2024-02-27 ENCOUNTER — Encounter: Payer: Self-pay | Admitting: Urology

## 2024-03-25 ENCOUNTER — Ambulatory Visit
Admission: RE | Admit: 2024-03-25 | Discharge: 2024-03-25 | Disposition: A | Discharge status: Home / Self Care | Source: Ambulatory Visit | Attending: Urology | Admitting: Urology

## 2024-03-25 DIAGNOSIS — R972 Elevated prostate specific antigen [PSA]: Secondary | ICD-10-CM

## 2024-03-25 MED ORDER — GADOPICLENOL 0.5 MMOL/ML IV SOLN
9.0000 mL | Freq: Once | INTRAVENOUS | Status: AC | PRN
  Administered 2024-03-25: 9 mL via INTRAVENOUS

## 2024-04-24 ENCOUNTER — Telehealth: Payer: Self-pay

## 2024-04-24 ENCOUNTER — Telehealth (HOSPITAL_BASED_OUTPATIENT_CLINIC_OR_DEPARTMENT_OTHER): Payer: Self-pay

## 2024-04-24 NOTE — Telephone Encounter (Signed)
 Preop tele appt now scheduled, med rec and consent done.

## 2024-04-24 NOTE — Telephone Encounter (Signed)
   Name: Christopher Tanner  DOB: [birth year]  MRN: [id number]  Primary Cardiologist: Darryle ONEIDA Decent, MD   Preoperative team, please contact this patient and set up a phone call appointment for further preoperative risk assessment. Please obtain consent and complete medication review. Thank you for your help.  I confirm that guidance regarding antiplatelet and oral anticoagulation therapy has been completed and, if necessary, noted below.  Per office protocol, if patient is without any new symptoms or concerns at the time of their virtual visit, he may hold ASA for 7 days prior to procedure. Please resume ASA as soon as possible postprocedure, at the discretion of the surgeon.    I also confirmed the patient resides in the state of [place] . As per [facility] Medical Board telemedicine laws, the patient must reside in the state in which the provider is licensed.   Lamarr Satterfield, NP 04/24/2024, 2:51 PM [facility]

## 2024-04-24 NOTE — Telephone Encounter (Signed)
   Pre-operative Risk Assessment    Patient Name: Christopher Tanner  DOB: 08/08/1955 MRN: [id number]   Date of last office visit: 12/27/23 with Dr. Alveta Date of next office visit: N/A   Request for Surgical Clearance    Procedure:  In office MRI fusion Bx - prostate biopsy  Date of Surgery:  Clearance TBD                                 Surgeon:  Dr. Morene Salines Surgeon's Group or Practice Name:  [facility] Phone number:  [phone] Fax number:  [phone]   Type of Clearance Requested:   - Medical  - Pharmacy:  Hold Aspirin -Needs permission to hold 5 days prior to prostate biopsy   Type of Anesthesia:  Does not specify   Additional requests/questions:  Patient takes Aspirin 81 mg for Mitral Valve repair. Permission to hold for 5 days prior to prostate biopsy.  Signed, Patrcia Hong L   04/24/2024, 2:18 PM

## 2024-04-28 ENCOUNTER — Telehealth (HOSPITAL_BASED_OUTPATIENT_CLINIC_OR_DEPARTMENT_OTHER): Payer: Self-pay | Admitting: *Deleted

## 2024-04-28 NOTE — Telephone Encounter (Signed)
   Name: Christopher Tanner  DOB: October 26, 1955  MRN: [id number]  Primary Cardiologist: Darryle ONEIDA Decent, MD   Preoperative team, please contact this patient and set up a phone call appointment for further preoperative risk assessment. Please obtain consent and complete medication review. Thank you for your help.   Patient has televisit scheduled for 05/14/2024, both procedures can be addressed at that time.  I confirm that guidance regarding antiplatelet and oral anticoagulation therapy has been completed and, if necessary, noted below.  Regarding ASA therapy, we recommend continuation of ASA throughout the perioperative period.  However, if the surgeon feels that cessation of ASA is required in the perioperative period, it may be stopped 5-7 days prior to surgery with a plan to resume it as soon as felt to be feasible from a surgical standpoint in the post-operative period.   I also confirmed the patient resides in the state of [place] . As per [facility] Medical Board telemedicine laws, the patient must reside in the state in which the provider is licensed.   Darryon Bastin D Ramin Zoll, NP 04/28/2024, 4:24 PM [facility]

## 2024-04-28 NOTE — Telephone Encounter (Signed)
 Pt has tele appt 05/14/24.

## 2024-04-28 NOTE — Telephone Encounter (Signed)
   Pre-operative Risk Assessment    Patient Name: MCLANE ARORA  DOB: Apr 27, 1956 MRN: [id number]   Date of last office visit: 12/27/23 DR. NASHER Date of next office visit: 05/14/24 TELE PREOP APPT    Request for Surgical Clearance    Procedure: RIGHT SHOULDER ARTHROSCOPY, DEBRIDEMENT   Date of Surgery:  Clearance 05/26/24                                Surgeon:  DR. [name] SUPPLE  Surgeon's Group or Practice Name:  [facility] Phone number:  [phone] [facility] Fax number:  [phone]   Type of Clearance Requested:   - Medical  - Pharmacy:  Hold Aspirin     Type of Anesthesia:  Not Indicated   Additional requests/questions:    Bonney Niels Jest   04/28/2024, 2:45 PM

## 2024-05-14 ENCOUNTER — Ambulatory Visit: Attending: Cardiovascular Disease | Admitting: Cardiology

## 2024-05-14 DIAGNOSIS — Z01810 Encounter for preprocedural cardiovascular examination: Secondary | ICD-10-CM

## 2024-05-14 DIAGNOSIS — Z01818 Encounter for other preprocedural examination: Secondary | ICD-10-CM

## 2024-05-14 NOTE — Progress Notes (Signed)
 Virtual Visit via Telephone Note   Because of CARDALE DORER co-morbid illnesses, he is at least at moderate risk for complications without adequate follow up.  This format is felt to be most appropriate for this patient at this time.  Due to technical limitations with video connection web designer), today's appointment will be conducted as an audio only telehealth visit, and Christopher Tanner verbally agreed to proceed in this manner.   All issues noted in this document were discussed and addressed.  No physical exam could be performed with this format.  Evaluation Performed:  Preoperative cardiovascular risk assessment _____________   Date:  05/14/2024   Patient ID:  Christopher Tanner, DOB 1955-11-30, MRN [id number] Patient Location:  Home Provider location:   Office  Primary Care Provider:  Yolande Toribio MATSU, MD Primary Cardiologist:  Darryle ONEIDA Decent, MD  Chief Complaint / Patient Profile   68 y.o. y/o male with a h/o, dyslipidemia, mitral valve repair in 2024 who is pending an office MRI fusion biopsy of his prostate as well as upcoming right shoulder arthroscopy and presents today for telephonic preoperative cardiovascular risk assessment.  History of Present Illness    Christopher Tanner is a 68 y.o. male who presents via audio/video conferencing for a telehealth visit today.  Pt was last seen in cardiology clinic on 12/27/2023 by Dr. Alveta.  At that time Christopher Tanner was doing well, continued to work out several days a week.  The patient is now pending procedure as outlined above. Since his last visit, he continues to be very physically active, walking for exercise and riding his Peloton bike several days per week. Mentions he recently returned from [place] and was digging ditches etc. He denies chest pain, palpitations, dyspnea, pnd, orthopnea, n, v, dizziness, syncope, edema, weight gain, or early satiety.    Past Medical History    Past Medical History:  Diagnosis Date   Anal fistula     Hyperlipidemia    on meds   Mitral valve prolapse    Last echocardiogram July, 2008. Moderate MVP of the posterior leaflet. Moderate mitral regurgitation. Normal left ventricular systolic function.   Numbness in feet    Plantar fasciitis    right   Past Surgical History:  Procedure Laterality Date   BUBBLE STUDY  09/08/2020   Procedure: BUBBLE STUDY;  Surgeon: Alveta Aleene PARAS, MD;  Location: [facility] ENDOSCOPY;  Service: Cardiovascular;;   COLONOSCOPY  03/06/2021   (DP-hems/normal.  2012), MS at [facility]   hand broken Left    Left hand bones- 4 different times   KNEE ARTHROSCOPY Left 2014   meniscectomy   s/p Robotic Mitral Valve Repair  01/23/2023   At the [facility]   SHOULDER SURGERY Left 12/2016   arthroscopy, biceps tendon repair   TEE WITHOUT CARDIOVERSION N/A 09/08/2020   Procedure: TRANSESOPHAGEAL ECHOCARDIOGRAM (TEE);  Surgeon: Alveta Aleene PARAS, MD;  Location: [facility] ENDOSCOPY;  Service: Cardiovascular;  Laterality: N/A;   TONSILLECTOMY     TREATMENT FISTULA ANAL  04/2011   WISDOM TOOTH EXTRACTION      Allergies  Allergies  Allergen Reactions   Ezetimibe -Simvastatin     Stopped due to publicity about zetia    Simvastatin     Memory Loss   Niaspan [Niacin] Other (See Comments)    flushing    Home Medications    Prior to Admission medications   Medication Sig Start Date End Date Taking? Authorizing Provider  acetaminophen (TYLENOL) 325 MG tablet as  needed. 01/05/10   [provider]  aspirin 81 MG tablet Take 81 mg by mouth daily.    [provider]  carboxymethylcellulose (REFRESH PLUS) 0.5 % SOLN Place 1 drop into both eyes 2 (two) times daily.    [provider]  ibuprofen (ADVIL) 200 MG tablet as needed. 01/05/10   [provider]  Multiple Vitamins-Minerals (MULTIVITAL PERFORMANCE) TABS Take 1 tablet by mouth daily. 11/17/12   Nahser, Aleene PARAS, MD  psyllium (METAMUCIL SMOOTH TEXTURE) 28 % packet Take 1 packet by mouth at bedtime.     [provider]  rosuvastatin  (CRESTOR ) 5 MG tablet TAKE 1 TABLET (5 MG TOTAL) BY MOUTH DAILY. Patient taking differently: Take 5 mg by mouth every other day. 07/16/23   Nahser, Aleene PARAS, MD  sildenafil (REVATIO) 20 MG tablet daily as needed. 01/31/16   [provider]    Physical Exam    Vital Signs:  Christopher Tanner does not have vital signs available for review today.  Given telephonic nature of communication, physical exam is limited. AAOx3. NAD. Normal affect.  Speech and respirations are unlabored.  Accessory Clinical Findings    None  Assessment & Plan    1.  Preoperative Cardiovascular Risk Assessment: According to the Revised Cardiac Risk Index (RCRI), his Perioperative Risk of Major Cardiac Event is (%): 0.4 His Functional Capacity in METs is: 7.59 according to the Duke Activity Status Index (DASI). Therefore, based on ACC/AHA guidelines, patient would be at acceptable risk for the planned procedure without further cardiovascular testing. I will route this recommendation to the requesting party via Epic fax function.   The patient was advised that if he develops new symptoms prior to surgery to contact our office to arrange for a follow-up visit, and he verbalized understanding.   Per office protocol, he may hold ASA for 7 days prior to procedure. Please resume ASA as soon as possible postprocedure, at the discretion of the surgeon    Time:   Today, I have spent 10 minutes with the patient with telehealth technology discussing medical history, symptoms, and management plan.     Delon JAYSON Hoover, NP  05/14/2024, 8:51 AM

## 2024-07-23 ENCOUNTER — Other Ambulatory Visit (HOSPITAL_COMMUNITY): Payer: Self-pay | Admitting: Urology

## 2024-07-23 DIAGNOSIS — C61 Malignant neoplasm of prostate: Secondary | ICD-10-CM

## 2024-08-03 ENCOUNTER — Encounter (HOSPITAL_COMMUNITY)
Admission: RE | Admit: 2024-08-03 | Discharge: 2024-08-03 | Disposition: A | Discharge status: Home / Self Care | Source: Ambulatory Visit | Attending: Urology | Admitting: Urology

## 2024-08-03 DIAGNOSIS — C61 Malignant neoplasm of prostate: Secondary | ICD-10-CM | POA: Diagnosis present

## 2024-08-03 MED ORDER — FLOTUFOLASTAT F 18 GALLIUM 296-5846 MBQ/ML IV SOLN
8.4000 | Freq: Once | INTRAVENOUS | Status: AC
  Administered 2024-08-03: 8.4 via INTRAVENOUS

## 2024-08-11 ENCOUNTER — Telehealth: Payer: Self-pay

## 2024-08-11 MED ORDER — ROSUVASTATIN CALCIUM 5 MG PO TABS: 5.0000 mg | ORAL_TABLET | Freq: Every day | ORAL | 0 refills | Status: AC

## 2024-08-11 NOTE — Telephone Encounter (Signed)
 In accordance with refill protocols, please review and address the following requirements before this medication refill can be authorized:  Labs   - Lipid Panel within 12 months

## 2024-08-11 NOTE — Telephone Encounter (Signed)
Pt calling to f/u on refill.

## 2024-08-21 ENCOUNTER — Ambulatory Visit: Admitting: Radiation Oncology

## 2024-12-24 ENCOUNTER — Ambulatory Visit: Admitting: Cardiovascular Disease
